# Patient Record
Sex: Female | Born: 1950
Health system: Southern US, Community
[De-identification: ages and names within clinical notes are randomized; demographics above are authoritative.]

## PROBLEM LIST (undated history)

## (undated) DIAGNOSIS — E854 Organ-limited amyloidosis: Secondary | ICD-10-CM

## (undated) DIAGNOSIS — I00 Rheumatic fever without heart involvement: Secondary | ICD-10-CM

## (undated) DIAGNOSIS — M1990 Unspecified osteoarthritis, unspecified site: Secondary | ICD-10-CM

## (undated) DIAGNOSIS — I10 Essential (primary) hypertension: Secondary | ICD-10-CM

## (undated) DIAGNOSIS — R0989 Other specified symptoms and signs involving the circulatory and respiratory systems: Secondary | ICD-10-CM

## (undated) DIAGNOSIS — J309 Allergic rhinitis, unspecified: Secondary | ICD-10-CM

## (undated) DIAGNOSIS — I499 Cardiac arrhythmia, unspecified: Secondary | ICD-10-CM

## (undated) DIAGNOSIS — E785 Hyperlipidemia, unspecified: Secondary | ICD-10-CM

## (undated) DIAGNOSIS — I4892 Unspecified atrial flutter: Secondary | ICD-10-CM

## (undated) DIAGNOSIS — Z5189 Encounter for other specified aftercare: Secondary | ICD-10-CM

## (undated) HISTORY — DX: Allergic rhinitis, unspecified: J30.9

## (undated) HISTORY — DX: Hyperlipidemia, unspecified: E78.5

## (undated) HISTORY — DX: Other specified symptoms and signs involving the circulatory and respiratory systems: R09.89

## (undated) HISTORY — DX: Essential (primary) hypertension: I10

## (undated) HISTORY — PX: EYE SURGERY: SHX253

## (undated) HISTORY — DX: Rheumatic fever without heart involvement: I00

## (undated) HISTORY — DX: Encounter for other specified aftercare: Z51.89

---

## 1979-03-09 DIAGNOSIS — Z5189 Encounter for other specified aftercare: Secondary | ICD-10-CM

## 1979-03-09 HISTORY — PX: SALPINGECTOMY: SHX328

## 1979-03-09 HISTORY — DX: Encounter for other specified aftercare: Z51.89

## 1998-06-26 ENCOUNTER — Other Ambulatory Visit: Admission: RE | Admit: 1998-06-26 | Discharge: 1998-06-26 | Payer: Self-pay | Admitting: Obstetrics and Gynecology

## 1999-07-31 ENCOUNTER — Other Ambulatory Visit: Admission: RE | Admit: 1999-07-31 | Discharge: 1999-07-31 | Payer: Self-pay | Admitting: Obstetrics and Gynecology

## 2000-09-20 ENCOUNTER — Other Ambulatory Visit: Admission: RE | Admit: 2000-09-20 | Discharge: 2000-09-20 | Payer: Self-pay | Admitting: Obstetrics and Gynecology

## 2001-01-02 ENCOUNTER — Inpatient Hospital Stay (HOSPITAL_COMMUNITY): Admission: EM | Admit: 2001-01-02 | Discharge: 2001-01-04 | Payer: Self-pay | Admitting: Emergency Medicine

## 2001-01-03 ENCOUNTER — Encounter: Payer: Self-pay | Admitting: Infectious Diseases

## 2001-11-30 ENCOUNTER — Other Ambulatory Visit: Admission: RE | Admit: 2001-11-30 | Discharge: 2001-11-30 | Payer: Self-pay | Admitting: Obstetrics and Gynecology

## 2003-02-21 ENCOUNTER — Other Ambulatory Visit: Admission: RE | Admit: 2003-02-21 | Discharge: 2003-02-21 | Payer: Self-pay | Admitting: Obstetrics and Gynecology

## 2004-01-22 ENCOUNTER — Ambulatory Visit: Payer: Self-pay | Admitting: Internal Medicine

## 2004-01-29 ENCOUNTER — Ambulatory Visit: Payer: Self-pay | Admitting: Internal Medicine

## 2004-04-03 ENCOUNTER — Ambulatory Visit: Payer: Self-pay | Admitting: Gastroenterology

## 2004-04-17 ENCOUNTER — Ambulatory Visit: Payer: Self-pay | Admitting: Gastroenterology

## 2004-08-28 ENCOUNTER — Other Ambulatory Visit: Admission: RE | Admit: 2004-08-28 | Discharge: 2004-08-28 | Payer: Self-pay | Admitting: Obstetrics and Gynecology

## 2005-03-08 HISTORY — PX: CARPAL TUNNEL RELEASE: SHX101

## 2005-10-12 ENCOUNTER — Ambulatory Visit: Payer: Self-pay | Admitting: Internal Medicine

## 2005-10-25 ENCOUNTER — Ambulatory Visit: Payer: Self-pay | Admitting: Internal Medicine

## 2007-01-02 ENCOUNTER — Ambulatory Visit: Payer: Self-pay | Admitting: Internal Medicine

## 2007-01-02 LAB — CONVERTED CEMR LAB
ALT: 23 units/L (ref 0–35)
AST: 20 units/L (ref 0–37)
Albumin: 3.9 g/dL (ref 3.5–5.2)
Alkaline Phosphatase: 78 units/L (ref 39–117)
BUN: 12 mg/dL (ref 6–23)
Basophils Absolute: 0 10*3/uL (ref 0.0–0.1)
Basophils Relative: 0.9 % (ref 0.0–1.0)
Bilirubin Urine: NEGATIVE
Bilirubin, Direct: 0.1 mg/dL (ref 0.0–0.3)
Blood in Urine, dipstick: NEGATIVE
CO2: 33 meq/L — ABNORMAL HIGH (ref 19–32)
Calcium: 9.7 mg/dL (ref 8.4–10.5)
Chloride: 106 meq/L (ref 96–112)
Cholesterol: 264 mg/dL (ref 0–200)
Creatinine, Ser: 0.7 mg/dL (ref 0.4–1.2)
Direct LDL: 186.9 mg/dL
Eosinophils Absolute: 0.2 10*3/uL (ref 0.0–0.6)
Eosinophils Relative: 4.6 % (ref 0.0–5.0)
GFR calc Af Amer: 111 mL/min
GFR calc non Af Amer: 91 mL/min
Glucose, Bld: 99 mg/dL (ref 70–99)
Glucose, Urine, Semiquant: NEGATIVE
HCT: 42.4 % (ref 36.0–46.0)
HDL: 48.9 mg/dL (ref >39.0)
Hemoglobin: 14.6 g/dL (ref 12.0–15.0)
Ketones, urine, test strip: NEGATIVE
Lymphocytes Relative: 33.6 % (ref 12.0–46.0)
MCHC: 34.3 g/dL (ref 30.0–36.0)
MCV: 90.5 fL (ref 78.0–100.0)
Monocytes Absolute: 0.3 10*3/uL (ref 0.2–0.7)
Monocytes Relative: 7.5 % (ref 3.0–11.0)
Neutro Abs: 1.9 10*3/uL (ref 1.4–7.7)
Neutrophils Relative %: 53.4 % (ref 43.0–77.0)
Nitrite: NEGATIVE
Platelets: 225 10*3/uL (ref 150–400)
Potassium: 5 meq/L (ref 3.5–5.1)
Protein, U semiquant: NEGATIVE
RBC: 4.69 M/uL (ref 3.87–5.11)
RDW: 12.4 % (ref 11.5–14.6)
Sodium: 143 meq/L (ref 135–145)
Specific Gravity, Urine: 1.01
TSH: 1.4 microintl units/mL (ref 0.35–5.50)
Total Bilirubin: 1.2 mg/dL (ref 0.3–1.2)
Total CHOL/HDL Ratio: 5.4
Total Protein: 6.9 g/dL (ref 6.0–8.3)
Triglycerides: 169 mg/dL — ABNORMAL HIGH (ref 0–149)
Urobilinogen, UA: 0.2
VLDL: 34 mg/dL (ref 0–40)
WBC: 3.6 10*3/uL — ABNORMAL LOW (ref 4.5–10.5)
pH: 7

## 2007-01-10 ENCOUNTER — Ambulatory Visit: Payer: Self-pay | Admitting: Internal Medicine

## 2007-05-02 ENCOUNTER — Ambulatory Visit: Payer: Self-pay | Admitting: Internal Medicine

## 2007-05-02 DIAGNOSIS — E785 Hyperlipidemia, unspecified: Secondary | ICD-10-CM

## 2007-05-02 HISTORY — DX: Hyperlipidemia, unspecified: E78.5

## 2007-09-04 ENCOUNTER — Telehealth (INDEPENDENT_AMBULATORY_CARE_PROVIDER_SITE_OTHER): Payer: Self-pay | Admitting: *Deleted

## 2007-10-23 ENCOUNTER — Telehealth: Payer: Self-pay | Admitting: Internal Medicine

## 2008-01-11 ENCOUNTER — Ambulatory Visit: Payer: Self-pay | Admitting: Internal Medicine

## 2008-01-11 LAB — CONVERTED CEMR LAB
ALT: 22 units/L (ref 0–35)
AST: 20 units/L (ref 0–37)
Albumin: 4.1 g/dL (ref 3.5–5.2)
Alkaline Phosphatase: 90 units/L (ref 39–117)
BUN: 19 mg/dL (ref 6–23)
Basophils Absolute: 0 10*3/uL (ref 0.0–0.1)
Basophils Relative: 1 % (ref 0.0–3.0)
Bilirubin Urine: NEGATIVE
Bilirubin, Direct: 0.2 mg/dL (ref 0.0–0.3)
Blood in Urine, dipstick: NEGATIVE
CO2: 31 meq/L (ref 19–32)
Calcium: 9.6 mg/dL (ref 8.4–10.5)
Chloride: 106 meq/L (ref 96–112)
Cholesterol: 240 mg/dL (ref 0–200)
Creatinine, Ser: 0.8 mg/dL (ref 0.4–1.2)
Direct LDL: 170.4 mg/dL
Eosinophils Absolute: 0.1 10*3/uL (ref 0.0–0.7)
Eosinophils Relative: 3.6 % (ref 0.0–5.0)
GFR calc Af Amer: 94 mL/min
GFR calc non Af Amer: 78 mL/min
Glucose, Bld: 85 mg/dL (ref 70–99)
Glucose, Urine, Semiquant: NEGATIVE
HCT: 42.2 % (ref 36.0–46.0)
HDL: 52 mg/dL (ref >39.0)
Hemoglobin: 14.8 g/dL (ref 12.0–15.0)
Ketones, urine, test strip: NEGATIVE
Lymphocytes Relative: 39.2 % (ref 12.0–46.0)
MCHC: 35 g/dL (ref 30.0–36.0)
MCV: 90.3 fL (ref 78.0–100.0)
Monocytes Absolute: 0.3 10*3/uL (ref 0.1–1.0)
Monocytes Relative: 7.1 % (ref 3.0–12.0)
Neutro Abs: 2.1 10*3/uL (ref 1.4–7.7)
Neutrophils Relative %: 49.1 % (ref 43.0–77.0)
Nitrite: NEGATIVE
Platelets: 236 10*3/uL (ref 150–400)
Potassium: 3.9 meq/L (ref 3.5–5.1)
Protein, U semiquant: NEGATIVE
RBC: 4.67 M/uL (ref 3.87–5.11)
RDW: 12.4 % (ref 11.5–14.6)
Sodium: 144 meq/L (ref 135–145)
Specific Gravity, Urine: 1.02
TSH: 1.59 microintl units/mL (ref 0.35–5.50)
Total Bilirubin: 1.6 mg/dL — ABNORMAL HIGH (ref 0.3–1.2)
Total CHOL/HDL Ratio: 4.6
Total Protein: 7.3 g/dL (ref 6.0–8.3)
Triglycerides: 80 mg/dL (ref 0–149)
Urobilinogen, UA: 0.2
VLDL: 16 mg/dL (ref 0–40)
Vit D, 1,25-Dihydroxy: 48 (ref 30–89)
WBC: 4.1 10*3/uL — ABNORMAL LOW (ref 4.5–10.5)
pH: 5.5

## 2008-01-12 ENCOUNTER — Ambulatory Visit: Payer: Self-pay | Admitting: Internal Medicine

## 2008-01-12 DIAGNOSIS — I10 Essential (primary) hypertension: Secondary | ICD-10-CM

## 2008-01-12 DIAGNOSIS — J309 Allergic rhinitis, unspecified: Secondary | ICD-10-CM

## 2008-01-12 HISTORY — DX: Allergic rhinitis, unspecified: J30.9

## 2008-01-12 HISTORY — DX: Essential (primary) hypertension: I10

## 2008-11-08 ENCOUNTER — Encounter (INDEPENDENT_AMBULATORY_CARE_PROVIDER_SITE_OTHER): Payer: Self-pay | Admitting: *Deleted

## 2008-11-08 ENCOUNTER — Encounter: Payer: Self-pay | Admitting: Internal Medicine

## 2009-03-25 ENCOUNTER — Ambulatory Visit: Payer: Self-pay | Admitting: Internal Medicine

## 2009-03-25 LAB — CONVERTED CEMR LAB
ALT: 19 units/L (ref 0–35)
AST: 21 units/L (ref 0–37)
Albumin: 4.1 g/dL (ref 3.5–5.2)
Alkaline Phosphatase: 58 units/L (ref 39–117)
BUN: 20 mg/dL (ref 6–23)
Basophils Absolute: 0 10*3/uL (ref 0.0–0.1)
Basophils Relative: 0.7 % (ref 0.0–3.0)
Bilirubin Urine: NEGATIVE
Bilirubin, Direct: 0.1 mg/dL (ref 0.0–0.3)
Blood in Urine, dipstick: NEGATIVE
CO2: 31 meq/L (ref 19–32)
Calcium: 9.9 mg/dL (ref 8.4–10.5)
Chloride: 109 meq/L (ref 96–112)
Cholesterol: 216 mg/dL — ABNORMAL HIGH (ref 0–200)
Creatinine, Ser: 0.7 mg/dL (ref 0.4–1.2)
Direct LDL: 150.5 mg/dL
Eosinophils Absolute: 0.1 10*3/uL (ref 0.0–0.7)
Eosinophils Relative: 2.5 % (ref 0.0–5.0)
GFR calc non Af Amer: 90.56 mL/min (ref >60)
Glucose, Bld: 88 mg/dL (ref 70–99)
Glucose, Urine, Semiquant: NEGATIVE
HCT: 41.3 % (ref 36.0–46.0)
HDL: 57.6 mg/dL (ref >39.00)
Hemoglobin: 13.7 g/dL (ref 12.0–15.0)
Lymphocytes Relative: 35.3 % (ref 12.0–46.0)
Lymphs Abs: 1.3 10*3/uL (ref 0.7–4.0)
MCHC: 33.1 g/dL (ref 30.0–36.0)
MCV: 92.8 fL (ref 78.0–100.0)
Monocytes Absolute: 0.2 10*3/uL (ref 0.1–1.0)
Monocytes Relative: 5.8 % (ref 3.0–12.0)
Neutro Abs: 2.1 10*3/uL (ref 1.4–7.7)
Neutrophils Relative %: 55.7 % (ref 43.0–77.0)
Nitrite: NEGATIVE
Platelets: 219 10*3/uL (ref 150.0–400.0)
Potassium: 5 meq/L (ref 3.5–5.1)
Protein, U semiquant: NEGATIVE
RBC: 4.44 M/uL (ref 3.87–5.11)
RDW: 12.2 % (ref 11.5–14.6)
Sodium: 144 meq/L (ref 135–145)
Specific Gravity, Urine: 1.015
TSH: 1.04 microintl units/mL (ref 0.35–5.50)
Total Bilirubin: 1.1 mg/dL (ref 0.3–1.2)
Total CHOL/HDL Ratio: 4
Total Protein: 7 g/dL (ref 6.0–8.3)
Triglycerides: 57 mg/dL (ref 0.0–149.0)
Urobilinogen, UA: 0.2
VLDL: 11.4 mg/dL (ref 0.0–40.0)
WBC Urine, dipstick: NEGATIVE
WBC: 3.7 10*3/uL — ABNORMAL LOW (ref 4.5–10.5)
pH: 6

## 2009-04-01 ENCOUNTER — Ambulatory Visit: Payer: Self-pay | Admitting: Internal Medicine

## 2009-04-01 ENCOUNTER — Telehealth: Payer: Self-pay | Admitting: Internal Medicine

## 2009-04-01 DIAGNOSIS — R0989 Other specified symptoms and signs involving the circulatory and respiratory systems: Secondary | ICD-10-CM

## 2009-04-01 HISTORY — DX: Other specified symptoms and signs involving the circulatory and respiratory systems: R09.89

## 2009-04-08 ENCOUNTER — Encounter: Payer: Self-pay | Admitting: Internal Medicine

## 2009-04-09 ENCOUNTER — Encounter: Payer: Self-pay | Admitting: Internal Medicine

## 2009-04-09 ENCOUNTER — Ambulatory Visit: Payer: Self-pay

## 2009-08-12 ENCOUNTER — Encounter: Payer: Self-pay | Admitting: Internal Medicine

## 2009-12-26 ENCOUNTER — Ambulatory Visit: Payer: Self-pay | Admitting: Cardiology

## 2009-12-26 ENCOUNTER — Encounter (INDEPENDENT_AMBULATORY_CARE_PROVIDER_SITE_OTHER): Payer: Self-pay | Admitting: Emergency Medicine

## 2010-01-09 ENCOUNTER — Ambulatory Visit: Payer: Self-pay | Admitting: Internal Medicine

## 2010-02-12 ENCOUNTER — Observation Stay (HOSPITAL_COMMUNITY): Admission: EM | Admit: 2010-02-12 | Discharge: 2009-12-26 | Payer: Self-pay | Admitting: Emergency Medicine

## 2010-04-09 NOTE — Assessment & Plan Note (Signed)
Summary: CPX/NJR   Vital Signs:  Patient profile:   60 year old female Weight:      127 pounds BMI:     23.69 BP sitting:   160 / 74  (left arm) Cuff size:   regular  Vitals Entered By: Raechel Ache, RN (April 01, 2009 1:21 PM) CC: CPX, labs done. Sees gyn.   CC:  CPX and labs done. Sees gyn.Marland Kitchen  History of Present Illness: 60 year old patient who is seen today for a health-maintenance examination .  She is followed annually by gynecology  Allergies: No Known Drug Allergies  Past History:  Past Medical History: history of rheumatic fever Hyperlipidemia hypertensive suspect Allergic rhinitis bilateral faint carotid bruits  Past Surgical History: ENT surgery,  gravida two, para one, abortus one  status post salpingectomy colonoscopy 2006  carotid doppler eval 1997 "plaque without blockage"  Family History: Reviewed history from 01/12/2008 and no changes required. father died 64, cerebrovascular disease mother died age 36, stomach cancer Aunt  history lung cancer paternal aunt with colon cancer  Three sisters, one died age 41  complications of congestive heart failure  Social History: Reviewed history from 01/12/2008 and no changes required. Married Never Smoke One  1 child  Review of Systems  The patient denies anorexia, fever, weight loss, weight gain, vision loss, decreased hearing, hoarseness, chest pain, syncope, dyspnea on exertion, peripheral edema, prolonged cough, headaches, hemoptysis, abdominal pain, melena, hematochezia, severe indigestion/heartburn, hematuria, incontinence, genital sores, muscle weakness, suspicious skin lesions, transient blindness, difficulty walking, depression, unusual weight change, abnormal bleeding, enlarged lymph nodes, angioedema, and breast masses.    Physical Exam  General:  Well-developed,well-nourished,in no acute distress; alert,appropriate and cooperative throughout examination; 160/80 Head:  Normocephalic and  atraumatic without obvious abnormalities. No apparent alopecia or balding. Eyes:  No corneal or conjunctival inflammation noted. EOMI. Perrla. Funduscopic exam benign, without hemorrhages, exudates or papilledema. Vision grossly normal. Ears:  External ear exam shows no significant lesions or deformities.  Otoscopic examination reveals clear canals, tympanic membranes are intact bilaterally without bulging, retraction, inflammation or discharge. Hearing is grossly normal bilaterally. Nose:  External nasal examination shows no deformity or inflammation. Nasal mucosa are pink and moist without lesions or exudates. Mouth:  Oral mucosa and oropharynx without lesions or exudates.  Teeth in good repair. Neck:  bilateral faint carotid bruit Chest Wall:  No deformities, masses, or tenderness noted. Breasts:  No mass, nodules, thickening, tenderness, bulging, retraction, inflamation, nipple discharge or skin changes noted.   Lungs:  Normal respiratory effort, chest expands symmetrically. Lungs are clear to auscultation, no crackles or wheezes. Heart:  Normal rate and regular rhythm. S1 and S2 normal without gallop, murmur, click, rub or other extra sounds. Abdomen:  Bowel sounds positive,abdomen soft and non-tender without masses, organomegaly or hernias noted. Msk:  No deformity or scoliosis noted of thoracic or lumbar spine.   Pulses:  R and L carotid,radial,femoral,dorsalis pedis and posterior tibial pulses are full and equal bilaterally Extremities:  No clubbing, cyanosis, edema, or deformity noted with normal full range of motion of all joints.   Neurologic:  No cranial nerve deficits noted. Station and gait are normal. Plantar reflexes are down-going bilaterally. DTRs are symmetrical throughout. Sensory, motor and coordinative functions appear intact. Skin:  Intact without suspicious lesions or rashes Cervical Nodes:  No lymphadenopathy noted Axillary Nodes:  No palpable lymphadenopathy Inguinal  Nodes:  No significant adenopathy Psych:  Cognition and judgment appear intact. Alert and cooperative with normal attention span and  concentration. No apparent delusions, illusions, hallucinations   Impression & Recommendations:  Problem # 1:  Preventive Health Care (ICD-V70.0)  Problem # 2:  ESSENTIAL HYPERTENSION, BENIGN (ICD-401.1)  Her updated medication list for this problem includes:    Hydrochlorothiazide 25 Mg Tabs (Hydrochlorothiazide) .Marland Kitchen... 1 once daily  Her updated medication list for this problem includes:    Hydrochlorothiazide 25 Mg Tabs (Hydrochlorothiazide) .Marland Kitchen... 1 once daily  Orders: Prescription Created Electronically [id number])  Problem # 3:  HYPERLIPIDEMIA (ICD-272.4) consider statin therapy if any significant findings on carotid artery.  Doppler study  Complete Medication List: 1)  Fluticasone Propionate 50 Mcg/act Susp (Fluticasone propionate) .... Use daily 2)  Fexofenadine Hcl 180 Mg Tabs (Fexofenadine hcl) .... One daily 3)  Finasteride 5 Mg Tabs (Finasteride) .... 1/2 once daily 4)  Epipen 0.3 Mg/0.46ml (1:1000) Devi (Epinephrine hcl (anaphylaxis)) .... As directed 5)  Hydrochlorothiazide 25 Mg Tabs (Hydrochlorothiazide) .Marland Kitchen.. 1 once daily  Other Orders: [facility] (Vascular)  Patient Instructions: 1)  Please schedule a follow-up appointment in 4 months. 2)  Check your Blood Pressure regularly. If it is above: you should make an appointment. 3)  Limit your Sodium (Salt). Prescriptions: HYDROCHLOROTHIAZIDE 25 MG TABS (HYDROCHLOROTHIAZIDE) 1 once daily  #90 x 4   Entered and Authorized by:   Gordy Savers  MD   Signed by:   Gordy Savers  MD on 04/01/2009   Method used:   Print then Give to Patient   RxID:   [id number] FINASTERIDE 5 MG  TABS (FINASTERIDE) 1/2 once daily  #90 x 6   Entered and Authorized by:   Gordy Savers  MD   Signed by:   Gordy Savers  MD on 04/01/2009   Method used:   Print then Give to Patient    RxID:   [id number] FEXOFENADINE HCL 180 MG  TABS (FEXOFENADINE HCL) one daily  #90 x 6   Entered and Authorized by:   Gordy Savers  MD   Signed by:   Gordy Savers  MD on 04/01/2009   Method used:   Print then Give to Patient   RxID:   [id number] FLUTICASONE PROPIONATE 50 MCG/ACT  SUSP (FLUTICASONE PROPIONATE) use daily  #3 x 6   Entered and Authorized by:   Gordy Savers  MD   Signed by:   Gordy Savers  MD on 04/01/2009   Method used:   Print then Give to Patient   RxID:   [id number] HYDROCHLOROTHIAZIDE 25 MG TABS (HYDROCHLOROTHIAZIDE) 1 once daily  #90 x 4   Entered and Authorized by:   Gordy Savers  MD   Signed by:   Gordy Savers  MD on 04/01/2009   Method used:   Electronically to        [facility] [phone]* (retail)       [address] [place], [place]  [address]       Ph: [phone]       Fax: [phone]   RxID:   [id number] FINASTERIDE 5 MG  TABS (FINASTERIDE) 1/2 once daily  #90 x 6   Entered and Authorized by:   Gordy Savers  MD   Signed by:   Gordy Savers  MD on 04/01/2009   Method used:   Electronically to        [facility] [phone]* (retail)       [address]  [place], [place]  [address]       Ph: [phone]       Fax: [phone]   RxID:   [id number] FEXOFENADINE HCL 180 MG  TABS (FEXOFENADINE HCL) one daily  #90 x 6   Entered and Authorized by:   Gordy Savers  MD   Signed by:   Gordy Savers  MD on 04/01/2009   Method used:   Electronically to        [facility] [phone]* (retail)       [address] [place], [place]  [address]       Ph: [phone]       Fax: [phone]   RxID:   [id number] FLUTICASONE PROPIONATE 50 MCG/ACT  SUSP (FLUTICASONE PROPIONATE) use daily  #3 x 6   Entered and Authorized by:   Gordy Savers  MD   Signed by:   Gordy Savers  MD on 04/01/2009   Method used:   Electronically to        [facility] [phone]* (retail)       [address] [place], [place]  [address]       Ph: [phone]       Fax: [phone]   RxID:   [id number]

## 2010-04-09 NOTE — Progress Notes (Signed)
Summary: EKG  Phone Note Call from Patient   Caller: Patient Call For: Gordy Savers  MD Summary of Call: Pt forgot to ask Dr. Kirtland Bouchard how her EKG looked today??? [phone] Initial call taken by: Lynann Beaver CMA,  April 01, 2009 3:50 PM  Follow-up for Phone Call        Pt notified. EKG normal. Follow-up by: Lynann Beaver CMA,  April 02, 2009 8:15 AM    EKG was normal

## 2010-04-09 NOTE — Assessment & Plan Note (Signed)
Summary: bp check//lch  I. a for a  Vital Signs:  Patient profile:   60 year old female Weight:      128 pounds Temp:     98.0 degrees F oral BP sitting:   140 / 80  (left arm) Cuff size:   regular  Vitals Entered By: Duard Brady LPN (January 09, 2010 8:32 AM) CC: bp check - also concerns about reflux Is Patient Diabetic? No   CC:  bp check - also concerns about reflux.  History of Present Illness: 60 year old patient who is seen today for follow-up of her hypertension.  Home blood pressure readings revealed consistently high systolic readings.  She also is complain of mild reflux symptoms.  Her mother died of esophageal cancer, and she is somewhat concerned.  She has been using OTC progress sac with great benefit.  She has a history of very mild right carotid stenosis and a bruit.  Statin therapy discussed  Preventive Screening-Counseling & Management  Alcohol-Tobacco     Smoking Status: never  Allergies (verified): No Known Drug Allergies  Past History:  Past Medical History: Reviewed history from 04/01/2009 and no changes required. history of rheumatic fever Hyperlipidemia hypertensive suspect Allergic rhinitis bilateral faint carotid bruits  Review of Systems  The patient denies anorexia, fever, weight loss, weight gain, vision loss, decreased hearing, hoarseness, chest pain, syncope, dyspnea on exertion, peripheral edema, prolonged cough, headaches, hemoptysis, abdominal pain, melena, hematochezia, severe indigestion/heartburn, hematuria, incontinence, genital sores, muscle weakness, suspicious skin lesions, transient blindness, difficulty walking, depression, unusual weight change, abnormal bleeding, enlarged lymph nodes, angioedema, and breast masses.    Physical Exam  General:  Well-developed,well-nourished,in no acute distress; alert,appropriate and cooperative throughout examination Head:  Normocephalic and atraumatic without obvious abnormalities. No  apparent alopecia or balding. Eyes:  No corneal or conjunctival inflammation noted. EOMI. Perrla. Funduscopic exam benign, without hemorrhages, exudates or papilledema. Vision grossly normal. Mouth:  Oral mucosa and oropharynx without lesions or exudates.  Teeth in good repair. Neck:  right carotid bruit Lungs:  Normal respiratory effort, chest expands symmetrically. Lungs are clear to auscultation, no crackles or wheezes. Heart:  Normal rate and regular rhythm. S1 and S2 normal without gallop, murmur, click, rub or other extra sounds. Abdomen:  Bowel sounds positive,abdomen soft and non-tender without masses, organomegaly or hernias noted. Msk:  No deformity or scoliosis noted of thoracic or lumbar spine.   Pulses:  R and L carotid,radial,femoral,dorsalis pedis and posterior tibial pulses are full and equal bilaterally Extremities:  No clubbing, cyanosis, edema, or deformity noted with normal full range of motion of all joints.     Impression & Recommendations:  Problem # 1:  CAROTID BRUITS, BILATERAL (ICD-785.9)  Problem # 2:  ESSENTIAL HYPERTENSION, BENIGN (ICD-401.1)  The following medications were removed from the medication list:    Hydrochlorothiazide 25 Mg Tabs (Hydrochlorothiazide) .Marland Kitchen... 1 once daily Her updated medication list for this problem includes:    Lisinopril-hydrochlorothiazide 20-25 Mg Tabs (Lisinopril-hydrochlorothiazide) ..... One daily  The following medications were removed from the medication list:    Hydrochlorothiazide 25 Mg Tabs (Hydrochlorothiazide) .Marland Kitchen... 1 once daily Her updated medication list for this problem includes:    Lisinopril-hydrochlorothiazide 20-25 Mg Tabs (Lisinopril-hydrochlorothiazide) ..... One daily  Problem # 3:  HYPERLIPIDEMIA (ICD-272.4)  Complete Medication List: 1)  Fluticasone Propionate 50 Mcg/act Susp (Fluticasone propionate) .... Use daily 2)  Fexofenadine Hcl 180 Mg Tabs (Fexofenadine hcl) .... One daily 3)  Finasteride 5 Mg  Tabs (Finasteride) .... 1/2 once  daily 4)  Epipen 0.3 Mg/0.10ml (1:1000) Devi (Epinephrine hcl (anaphylaxis)) .... As directed 5)  Lisinopril-hydrochlorothiazide 20-25 Mg Tabs (Lisinopril-hydrochlorothiazide) .... One daily  Patient Instructions: 1)  Please schedule a follow-up appointment in 3 months for annual exam Prescriptions: LISINOPRIL-HYDROCHLOROTHIAZIDE 20-25 MG TABS (LISINOPRIL-HYDROCHLOROTHIAZIDE) one daily  #90 x 6   Entered and Authorized by:   Gordy Savers  MD   Signed by:   Gordy Savers  MD on 01/09/2010   Method used:   Electronically to        [facility] [phone]* (retail)       [address] [place], [place]  [address]       Ph: [phone]       Fax: [phone]   RxID:   [id number]    Orders Added: 1)  Est. Patient Level III [[address]]

## 2010-04-09 NOTE — Miscellaneous (Signed)
Summary: Orders Update  Clinical Lists Changes  Orders: Added new Test order of Carotid Duplex (Carotid Duplex) - Signed 

## 2010-04-09 NOTE — Letter (Signed)
Summary: [facility]  [facility]   Imported By: Maryln Gottron 08/25/2009 10:45:35  _____________________________________________________________________  External Attachment:    Type:   Image     Comment:   External Document

## 2010-04-29 ENCOUNTER — Other Ambulatory Visit (INDEPENDENT_AMBULATORY_CARE_PROVIDER_SITE_OTHER): Payer: Managed Care, Other (non HMO) | Admitting: Internal Medicine

## 2010-04-29 DIAGNOSIS — E785 Hyperlipidemia, unspecified: Secondary | ICD-10-CM

## 2010-04-29 DIAGNOSIS — Z0000 Encounter for general adult medical examination without abnormal findings: Secondary | ICD-10-CM

## 2010-04-29 LAB — POCT URINALYSIS DIPSTICK
Bilirubin, UA: NEGATIVE
Ketones, UA: NEGATIVE
Nitrite, UA: NEGATIVE
pH, UA: 7

## 2010-04-29 LAB — BASIC METABOLIC PANEL
Chloride: 104 mEq/L (ref 96–112)
Potassium: 4.2 mEq/L (ref 3.5–5.1)

## 2010-04-29 LAB — HEPATIC FUNCTION PANEL
ALT: 22 U/L (ref 0–35)
Bilirubin, Direct: 0.2 mg/dL (ref 0.0–0.3)
Total Protein: 6.8 g/dL (ref 6.0–8.3)

## 2010-04-29 LAB — CBC WITH DIFFERENTIAL/PLATELET
Basophils Relative: 0.7 % (ref 0.0–3.0)
Eosinophils Relative: 4.3 % (ref 0.0–5.0)
HCT: 42.8 % (ref 36.0–46.0)
Hemoglobin: 14.5 g/dL (ref 12.0–15.0)
Lymphs Abs: 1.3 10*3/uL (ref 0.7–4.0)
MCV: 92.1 fl (ref 78.0–100.0)
Monocytes Absolute: 0.3 10*3/uL (ref 0.1–1.0)
Neutro Abs: 2.4 10*3/uL (ref 1.4–7.7)
RBC: 4.65 Mil/uL (ref 3.87–5.11)
WBC: 4.2 10*3/uL — ABNORMAL LOW (ref 4.5–10.5)

## 2010-04-29 LAB — LIPID PANEL
Cholesterol: 218 mg/dL — ABNORMAL HIGH (ref 0–200)
Triglycerides: 90 mg/dL (ref 0.0–149.0)

## 2010-04-29 LAB — TSH: TSH: 1.76 u[IU]/mL (ref 0.35–5.50)

## 2010-05-06 ENCOUNTER — Encounter: Payer: Self-pay | Admitting: Internal Medicine

## 2010-05-06 ENCOUNTER — Ambulatory Visit (INDEPENDENT_AMBULATORY_CARE_PROVIDER_SITE_OTHER): Payer: Managed Care, Other (non HMO) | Admitting: Internal Medicine

## 2010-05-06 VITALS — BP 110/70 | HR 74 | Temp 98.1°F | Resp 16 | Ht 61.5 in | Wt 126.0 lb

## 2010-05-06 DIAGNOSIS — Z0000 Encounter for general adult medical examination without abnormal findings: Secondary | ICD-10-CM

## 2010-05-06 DIAGNOSIS — K219 Gastro-esophageal reflux disease without esophagitis: Secondary | ICD-10-CM

## 2010-05-06 MED ORDER — HYDROCHLOROTHIAZIDE 12.5 MG PO TABS: 12.5000 mg | ORAL_TABLET | Freq: Every day | ORAL | Status: DC

## 2010-05-06 MED ORDER — FINASTERIDE 5 MG PO TABS: 5.0000 mg | ORAL_TABLET | Freq: Every day | ORAL | Status: DC

## 2010-05-06 MED ORDER — LANSOPRAZOLE 30 MG PO CPDR: 30.0000 mg | DELAYED_RELEASE_CAPSULE | Freq: Every day | ORAL | Status: AC

## 2010-05-06 MED ORDER — FLUTICASONE PROPIONATE 50 MCG/ACT NA SUSP: 2.0000 | Freq: Every day | NASAL | Status: DC

## 2010-05-06 NOTE — Progress Notes (Signed)
  Subjective:    Patient ID: Chelsea Morgan, female    DOB: 09-01-1950, 60 y.o.   MRN: [id number]  HPI   60 year old patient who is seen today for a preventive health examination. She has a history of hypertension which has been controlled on lisinopril hydrochlorothiazide. She states her blood pressure readings are often low and she has had a chronic cough on this blood pressure medication. Her chief complaint is hoarseness this has been evaluated a number of years ago by ENT. She has postnasal drip and some mild to moderate reflux symptoms. She feels the hoarseness was better on Prevacid. She takes her allergic rhinitis medications sporadically    Review of Systems  Constitutional: Negative for fever, appetite change, fatigue and unexpected weight change.  HENT: Positive for rhinorrhea and postnasal drip. Negative for hearing loss, ear pain, nosebleeds, congestion, sore throat, mouth sores, trouble swallowing, neck stiffness, dental problem, voice change, sinus pressure and tinnitus.   Eyes: Negative for photophobia, pain, redness and visual disturbance.  Respiratory: Negative for cough, chest tightness and shortness of breath.   Cardiovascular: Negative for chest pain, palpitations and leg swelling.  Gastrointestinal: Negative for nausea, vomiting, abdominal pain, diarrhea, constipation, blood in stool, abdominal distention and rectal pain.  Genitourinary: Negative for dysuria, urgency, frequency, hematuria, flank pain, vaginal bleeding, vaginal discharge, difficulty urinating, genital sores, vaginal pain, menstrual problem and pelvic pain.  Musculoskeletal: Negative for back pain and arthralgias.  Skin: Negative for rash.  Neurological: Negative for dizziness, syncope, speech difficulty, weakness, light-headedness, numbness and headaches.  Hematological: Negative for adenopathy. Does not bruise/bleed easily.  Psychiatric/Behavioral: Negative for suicidal ideas, behavioral problems, self-injury,  dysphoric mood and agitation. The patient is not nervous/anxious.        Objective:   Physical Exam  Constitutional: She is oriented to person, place, and time. She appears well-developed and well-nourished.  HENT:  Head: Normocephalic and atraumatic.  Right Ear: External ear normal.  Left Ear: External ear normal.  Mouth/Throat: Oropharynx is clear and moist.  Eyes: Conjunctivae and EOM are normal.  Neck: Normal range of motion. Neck supple. No JVD present. No thyromegaly present.        Left carotid bruit  Cardiovascular: Normal rate, regular rhythm and normal heart sounds.   No murmur heard.       The right dorsalis pedis pulse diminished  Pulmonary/Chest: Effort normal and breath sounds normal. She has no wheezes. She has no rales.  Abdominal: Soft. Bowel sounds are normal. She exhibits no distension and no mass. There is no tenderness. There is no rebound and no guarding.  Musculoskeletal: Normal range of motion. She exhibits no edema and no tenderness.  Neurological: She is alert and oriented to person, place, and time. She has normal reflexes. No cranial nerve deficit. She exhibits normal muscle tone. Coordination normal.  Skin: Skin is warm and dry. No rash noted.  Psychiatric: She has a normal mood and affect. Her behavior is normal.          Assessment & Plan:   preventive health examination  Hypertension. In view of her cough lisinopril will be discontinued and she will been maintained on hydrochlorothiazide only   Allergic rhinitis

## 2010-05-06 NOTE — Patient Instructions (Signed)
Please check your blood pressure on a regular basis.  If it is consistently greater than 150/90, please make an office appointment.    It is important that you exercise regularly, at least 20 minutes 3 to 4 times per week.  If you develop chest pain or shortness of breath seek  medical attention.  Limit your sodium (Salt) intake  Avoids foods high in acid such as tomatoes citrus juices, and spicy foods.  Avoid eating within two hours of lying down or before exercising.  Do not overheat.  Try smaller more frequent meals.  If symptoms persist, elevate the head of her bed 12 inches while sleeping.  Return in 6 months for follow-up

## 2010-05-20 LAB — BASIC METABOLIC PANEL
BUN: 15 mg/dL (ref 6–23)
CO2: 31 mEq/L (ref 19–32)
Calcium: 9.5 mg/dL (ref 8.4–10.5)
Chloride: 102 mEq/L (ref 96–112)
Creatinine, Ser: 0.94 mg/dL (ref 0.4–1.2)
GFR calc Af Amer: >60 mL/min (ref >60)

## 2010-05-20 LAB — CBC
HCT: 40.7 % (ref 36.0–46.0)
MCH: 29.6 pg (ref 26.0–34.0)
MCV: 89.3 fL (ref 78.0–100.0)
Platelets: 211 10*3/uL (ref 150–400)
RDW: 12.3 % (ref 11.5–15.5)
WBC: 6 10*3/uL (ref 4.0–10.5)

## 2010-05-20 LAB — DIFFERENTIAL
Basophils Absolute: 0.1 10*3/uL (ref 0.0–0.1)
Eosinophils Absolute: 0.2 10*3/uL (ref 0.0–0.7)
Eosinophils Relative: 4 % (ref 0–5)
Lymphocytes Relative: 40 % (ref 12–46)
Lymphs Abs: 2.4 10*3/uL (ref 0.7–4.0)
Monocytes Absolute: 0.6 10*3/uL (ref 0.1–1.0)

## 2010-05-20 LAB — POCT CARDIAC MARKERS
CKMB, poc: <1 ng/mL — ABNORMAL LOW (ref 1.0–8.0)
Troponin i, poc: <0.05 ng/mL (ref 0.00–0.09)
Troponin i, poc: <0.05 ng/mL (ref 0.00–0.09)

## 2010-07-24 NOTE — Assessment & Plan Note (Signed)
[facility] OFFICE NOTE   Chelsea Morgan, Chelsea Morgan                        MRN:          [id number]  DATE:10/25/2005                            DOB:          [birth year]    A 60 year old female seen today for a wellness exam.  She is seeing a  dermatologist for some hair loss.  She is also followed by Dr. Tenny Craw for her  gynecologic care.  She is doing reasonably well.  Her only concern is the  hair loss.  She is on Finasteride one-half tablet daily.  She takes a number  of vitamins, but no other prescription medications.   PAST MEDICAL HISTORY:  1. Bilateral carpal tunnel release.  She is still having some residual      numbness involving the second and third fingers.  2. Remote history of acute rheumatic fever.  3. In 1991 had ENT surgery.  4. Gravida 2, para 1, abortus 1, status post salpingectomy.  5. Hospitalized briefly in 2002 for cellulitis secondary to an insect      bite.   FAMILY HISTORY REVIEW:  Father died at 72 of cerebrovascular disease, mother  died at 34 of esophageal cancer, three sisters; one died at 27 of an MI.   PHYSICAL EXAMINATION:  GENERAL:  A healthy-appearing, fit lady in no acute  distress.  VITAL SIGNS:  Blood pressure is 120/80.  EYES, EARS, NOSE, AND THROAT:  Clear.  NECK:  No adenopathy or thyroid enlargement.  CHEST:  Clear.  BREASTS:  Negative.  CARDIOVASCULAR:  Normal heart sounds, no murmur.  ABDOMEN:  Benign, no organomegaly.  EXTREMITIES:  Negative with full peripheral pulses.   IMPRESSION:  1. Menopausal syndrome.  2. History of acute rheumatic fever.  3. Mild hypercholesterolemia.   DISPOSITION:  The patient is fairly sedentary, and exercise regimen  encouraged.  Will recheck in one year.  She will have a mammogram and  gynecologic exam in the near future.                                   Gordy Savers, MD   PFK/MedQ  DD:  10/25/2005  DT:  10/25/2005  Job #:  [id number]

## 2010-07-24 NOTE — Discharge Summary (Signed)
[facility]  Patient:    Chelsea Morgan, Chelsea Morgan Visit Number: [id number] MRN: [id number]          Service Type: MED Location: 3W [id number] [id number] Attending Physician:  Duke Salvia Dictated by:   Valetta Mole Swords, M.D. [facility] Admit Date:  01/02/2001 Discharge Date: 01/04/2001                             Discharge Summary  DISCHARGE DIAGNOSES: 1. Presumed cellulitis. 2. Insect bite.  OTHER MEDICAL HISTORY:  C-section, salpingectomy secondary to a tubal pregnancy.  She has had a history of rheumatic fever.  DISCHARGE MEDICATIONS:  Keflex 500 mg p.o. t.i.d. for four days.  She is to complete seven days of antibiotics.  HOSPITAL LABORATORIES:  Admission BMET normal except for a glucose of 145. Blood cultures are negative to date.  CBC on January 03, 2001, was normal with a white count of 4.5.  PROCEDURES:  CT scan of the upper extremity (right) was performed on January 03, 2001.  IMPRESSION:  Cellulitis with associated edema in the subcutaneous fat.  Also evidence of myositis involving the dorsal musculature of the forearm was seen.  HOSPITAL COURSE:  The patient was admitted to the hospital service on January 02, 2001, admitted with presumed cellulitis after an insect bite.  She was treated aggressively and started on Ancef.  She was seen in consultation by Dr. Maurice March who agreed with continued antibiotics and also recommended CT scan of the arm (results as above).  On January 04, 2001, she was much better.  There was essentially no erythema on the upper extremity except for the lateral area of the wrist which had a 3-cm area of erythema with fascicular area associated with the erythema.  There was no lymphadenopathy, no erythematous streaks of the upper extremity.  There was some soft tissue swelling of the hand only.  At the time of discharge, the patient was stable.  She will continue p.o. antibiotics.  She will follow up with Dr. Debby Bud.  FOLLOW-UP  PLANS:  Dr. Debby Bud in two to four weeks.  She is able to return to work next week.  CONDITION ON DISCHARGE:  Improved and stable. Dictated by:   Valetta Mole Swords, M.D. [facility] Attending Physician:  Duke Salvia DD:  01/04/01 TD:  01/04/01 Job: [id number]

## 2011-04-03 ENCOUNTER — Other Ambulatory Visit: Payer: Self-pay | Admitting: Internal Medicine

## 2011-05-28 ENCOUNTER — Encounter: Payer: Self-pay | Admitting: Gastroenterology

## 2011-07-14 ENCOUNTER — Other Ambulatory Visit (INDEPENDENT_AMBULATORY_CARE_PROVIDER_SITE_OTHER): Payer: Managed Care, Other (non HMO)

## 2011-07-14 DIAGNOSIS — Z0000 Encounter for general adult medical examination without abnormal findings: Secondary | ICD-10-CM

## 2011-07-14 LAB — HEPATIC FUNCTION PANEL
Bilirubin, Direct: 0.1 mg/dL (ref 0.0–0.3)
Total Bilirubin: 1.2 mg/dL (ref 0.3–1.2)
Total Protein: 7.4 g/dL (ref 6.0–8.3)

## 2011-07-14 LAB — BASIC METABOLIC PANEL
BUN: 16 mg/dL (ref 6–23)
CO2: 30 mEq/L (ref 19–32)
Calcium: 9.6 mg/dL (ref 8.4–10.5)
Chloride: 102 mEq/L (ref 96–112)
Creatinine, Ser: 0.7 mg/dL (ref 0.4–1.2)

## 2011-07-14 LAB — LIPID PANEL
Cholesterol: 217 mg/dL — ABNORMAL HIGH (ref 0–200)
HDL: 62.5 mg/dL (ref >39.00)
Total CHOL/HDL Ratio: 3
Triglycerides: 92 mg/dL (ref 0.0–149.0)
VLDL: 18.4 mg/dL (ref 0.0–40.0)

## 2011-07-14 LAB — POCT URINALYSIS DIPSTICK
Bilirubin, UA: NEGATIVE
Blood, UA: NEGATIVE
Nitrite, UA: NEGATIVE
pH, UA: 7

## 2011-07-14 LAB — CBC WITH DIFFERENTIAL/PLATELET
Basophils Absolute: 0 10*3/uL (ref 0.0–0.1)
Eosinophils Absolute: 0.1 10*3/uL (ref 0.0–0.7)
MCHC: 32.9 g/dL (ref 30.0–36.0)
MCV: 84.5 fl (ref 78.0–100.0)
Monocytes Absolute: 0.3 10*3/uL (ref 0.1–1.0)
Neutrophils Relative %: 56.3 % (ref 43.0–77.0)
Platelets: 245 10*3/uL (ref 150.0–400.0)
RDW: 17.2 % — ABNORMAL HIGH (ref 11.5–14.6)

## 2011-07-16 ENCOUNTER — Other Ambulatory Visit: Payer: Managed Care, Other (non HMO)

## 2011-07-23 ENCOUNTER — Encounter: Payer: Self-pay | Admitting: Internal Medicine

## 2011-07-30 ENCOUNTER — Ambulatory Visit (INDEPENDENT_AMBULATORY_CARE_PROVIDER_SITE_OTHER): Payer: Managed Care, Other (non HMO) | Admitting: Internal Medicine

## 2011-07-30 ENCOUNTER — Encounter: Payer: Self-pay | Admitting: Internal Medicine

## 2011-07-30 VITALS — BP 110/70 | HR 66 | Temp 98.0°F | Resp 16 | Ht 61.0 in | Wt 124.0 lb

## 2011-07-30 DIAGNOSIS — I10 Essential (primary) hypertension: Secondary | ICD-10-CM

## 2011-07-30 DIAGNOSIS — Z0000 Encounter for general adult medical examination without abnormal findings: Secondary | ICD-10-CM

## 2011-07-30 DIAGNOSIS — R0989 Other specified symptoms and signs involving the circulatory and respiratory systems: Secondary | ICD-10-CM

## 2011-07-30 DIAGNOSIS — J309 Allergic rhinitis, unspecified: Secondary | ICD-10-CM

## 2011-07-30 MED ORDER — HYDROCHLOROTHIAZIDE 12.5 MG PO CAPS: 12.5000 mg | ORAL_CAPSULE | Freq: Every day | ORAL | Status: DC

## 2011-07-30 MED ORDER — FINASTERIDE 5 MG PO TABS: 2.5000 mg | ORAL_TABLET | Freq: Every day | ORAL | Status: DC

## 2011-07-30 MED ORDER — FINASTERIDE 5 MG PO TABS: 5.0000 mg | ORAL_TABLET | Freq: Every day | ORAL | Status: DC

## 2011-07-30 MED ORDER — FLUTICASONE PROPIONATE 50 MCG/ACT NA SUSP: 2.0000 | Freq: Every day | NASAL | Status: DC

## 2011-07-30 NOTE — Progress Notes (Signed)
Subjective:    Patient ID: Chelsea Morgan, female    DOB: 1950-07-07, 61 y.o.   MRN: [id number]  HPI 61 year old patient who is seen today for a preventive health examination. Medical problems include hypertension treated with diuretic therapy as well as allergic rhinitis. She has had a recent gynecologic evaluation  .  She is doing quite well and does exercise regularly. No concerns or complaints   Past Medical History  Diagnosis Date  . ALLERGIC RHINITIS 01/12/2008  . CAROTID BRUITS, BILATERAL 04/01/2009  . Essential hypertension, benign 01/12/2008  . HYPERLIPIDEMIA 05/02/2007  . Rheumatic fever     History   Social History  . Marital Status: Married    Spouse Name: N/A    Number of Children: N/A  . Years of Education: N/A   Occupational History  . Not on file.   Social History Main Topics  . Smoking status: Never Smoker   . Smokeless tobacco: Never Used  . Alcohol Use: No  . Drug Use: No  . Sexually Active: Not on file   Other Topics Concern  . Not on file   Social History Narrative  . No narrative on file    Past Surgical History  Procedure Date  . Salpingectomy     Family History  Problem Relation Age of Onset  . Cancer Mother     stomach  . Stroke Father   . Cancer Paternal Aunt     colon    No Known Allergies  Current Outpatient Prescriptions on File Prior to Visit  Medication Sig Dispense Refill  . EPINEPHrine (EPIPEN) 0.3 mg/0.3 mL DEVI Inject 0.3 mg into the muscle once.        . fexofenadine (ALLEGRA) 180 MG tablet Take 180 mg by mouth daily.        . finasteride (PROSCAR) 5 MG tablet Take 1 tablet (5 mg total) by mouth daily. 1/2 once daily  90 tablet  6  . fluticasone (FLONASE) 50 MCG/ACT nasal spray 2 sprays by Nasal route daily.  16 g  6  . hydrochlorothiazide (HYDRODIURIL) 12.5 MG tablet Take 1 tablet (12.5 mg total) by mouth daily.  90 tablet  6    BP 110/72  Pulse 66  Temp(Src) 98 F (36.7 C) (Oral)  Resp 16  Ht 5\' 1"  (1.549 m)  Wt  124 lb (56.246 kg)  BMI 23.43 kg/m2  SpO2 99%   Wt Readings from Last 3 Encounters:  07/30/11 124 lb (56.246 kg)  05/06/10 126 lb (57.153 kg)  01/09/10 128 lb (58.06 kg)    Review of Systems  Constitutional: Negative for fever, appetite change, fatigue and unexpected weight change.  HENT: Positive for congestion and rhinorrhea. Negative for hearing loss, ear pain, nosebleeds, sore throat, mouth sores, trouble swallowing, neck stiffness, dental problem, voice change, sinus pressure and tinnitus.   Eyes: Negative for photophobia, pain, redness and visual disturbance.  Respiratory: Negative for cough, chest tightness and shortness of breath.   Cardiovascular: Negative for chest pain, palpitations and leg swelling.  Gastrointestinal: Negative for nausea, vomiting, abdominal pain, diarrhea, constipation, blood in stool, abdominal distention and rectal pain.  Genitourinary: Negative for dysuria, urgency, frequency, hematuria, flank pain, vaginal bleeding, vaginal discharge, difficulty urinating, genital sores, vaginal pain, menstrual problem and pelvic pain.  Musculoskeletal: Negative for back pain and arthralgias.  Skin: Negative for rash.  Neurological: Negative for dizziness, syncope, speech difficulty, weakness, light-headedness, numbness and headaches.  Hematological: Negative for adenopathy. Does not bruise/bleed easily.  Psychiatric/Behavioral: Negative  for suicidal ideas, behavioral problems, self-injury, dysphoric mood and agitation. The patient is not nervous/anxious.        Objective:   Physical Exam  Constitutional: She is oriented to person, place, and time. She appears well-developed and well-nourished.  HENT:  Head: Normocephalic and atraumatic.  Right Ear: External ear normal.  Left Ear: External ear normal.  Mouth/Throat: Oropharynx is clear and moist.  Eyes: Conjunctivae and EOM are normal.  Neck: Normal range of motion. Neck supple. No JVD present. No thyromegaly  present.       Bilateral carotid bruits  Cardiovascular: Normal rate, regular rhythm, normal heart sounds and intact distal pulses.   No murmur heard.      Diminished right dorsalis pedis pulse  Pulmonary/Chest: Effort normal and breath sounds normal. She has no wheezes. She has no rales.  Abdominal: Soft. Bowel sounds are normal. She exhibits no distension and no mass. There is no tenderness. There is no rebound and no guarding.  Musculoskeletal: Normal range of motion. She exhibits no edema and no tenderness.  Neurological: She is alert and oriented to person, place, and time. She has normal reflexes. No cranial nerve deficit. She exhibits normal muscle tone. Coordination normal.  Skin: Skin is warm and dry. No rash noted.  Psychiatric: She has a normal mood and affect. Her behavior is normal.          Assessment & Plan:  Preventive health examination Essential hypertension. Well controlled Allergic rhinitis stable Bilateral carotid bruits (carotid duplex February 2 011)

## 2011-07-30 NOTE — Patient Instructions (Signed)
Limit your sodium (Salt) intake    It is important that you exercise regularly, at least 20 minutes 3 to 4 times per week.  If you develop chest pain or shortness of breath seek  medical attention.  Please check your blood pressure on a regular basis.  If it is consistently greater than 150/90, please make an office appointment.  Return in one year for follow-up  Take a calcium supplement, plus 800-1200 units of vitamin D 

## 2012-03-15 ENCOUNTER — Encounter: Payer: Self-pay | Admitting: Internal Medicine

## 2012-04-19 ENCOUNTER — Encounter: Payer: Self-pay | Admitting: Family Medicine

## 2012-04-19 ENCOUNTER — Ambulatory Visit (INDEPENDENT_AMBULATORY_CARE_PROVIDER_SITE_OTHER): Payer: Managed Care, Other (non HMO) | Admitting: Family Medicine

## 2012-04-19 VITALS — BP 130/80 | HR 67 | Temp 98.1°F | Wt 126.0 lb

## 2012-04-19 DIAGNOSIS — R1013 Epigastric pain: Secondary | ICD-10-CM

## 2012-04-19 DIAGNOSIS — K219 Gastro-esophageal reflux disease without esophagitis: Secondary | ICD-10-CM

## 2012-04-19 LAB — COMPREHENSIVE METABOLIC PANEL
Albumin: 3.5 g/dL (ref 3.5–5.2)
Alkaline Phosphatase: 65 U/L (ref 39–117)
BUN: 14 mg/dL (ref 6–23)
Calcium: 9.1 mg/dL (ref 8.4–10.5)
Chloride: 102 mEq/L (ref 96–112)
Glucose, Bld: 83 mg/dL (ref 70–99)
Potassium: 3.4 mEq/L — ABNORMAL LOW (ref 3.5–5.1)

## 2012-04-19 LAB — CBC WITH DIFFERENTIAL/PLATELET
Eosinophils Relative: 2.7 % (ref 0.0–5.0)
Monocytes Relative: 12.9 % — ABNORMAL HIGH (ref 3.0–12.0)
Neutrophils Relative %: 61.5 % (ref 43.0–77.0)
Platelets: 237 10*3/uL (ref 150.0–400.0)
WBC: 4.4 10*3/uL — ABNORMAL LOW (ref 4.5–10.5)

## 2012-04-19 LAB — H. PYLORI ANTIBODY, IGG: H Pylori IgG: NEGATIVE

## 2012-04-19 LAB — LIPASE: Lipase: 31 U/L (ref 11.0–59.0)

## 2012-04-19 MED ORDER — OMEPRAZOLE 20 MG PO CPDR: 20.0000 mg | DELAYED_RELEASE_CAPSULE | Freq: Every day | ORAL | Status: DC

## 2012-04-19 NOTE — Patient Instructions (Addendum)
-  Start the prilosec daily  -drink plenty of water to stay hydrated  -follow up with your doctor in 1-2 weeks or see a doctor immediately if symptoms worsening, severe pain or cannot tolerate fluids        Diet for Gastroesophageal Reflux Disease, Adult Reflux (acid reflux) is when acid from your stomach flows up into the esophagus. When acid comes in contact with the esophagus, the acid causes irritation and soreness (inflammation) in the esophagus. When reflux happens often or so severely that it causes damage to the esophagus, it is called gastroesophageal reflux disease (GERD). Nutrition therapy can help ease the discomfort of GERD. FOODS OR DRINKS TO AVOID OR LIMIT  Smoking or chewing tobacco. Nicotine is one of the most potent stimulants to acid production in the gastrointestinal tract.  Caffeinated and decaffeinated coffee and black tea.  Regular or low-calorie carbonated beverages or energy drinks (caffeine-free carbonated beverages are allowed).   Strong spices, such as black pepper, white pepper, red pepper, cayenne, curry powder, and chili powder.  Peppermint or spearmint.  Chocolate.  High-fat foods, including meats and fried foods. Extra added fats including oils, butter, salad dressings, and nuts. Limit these to less than 8 tsp per day.  Fruits and vegetables if they are not tolerated, such as citrus fruits or tomatoes.  Alcohol.  Any food that seems to aggravate your condition. If you have questions regarding your diet, call your caregiver or a registered dietitian. OTHER THINGS THAT MAY HELP GERD INCLUDE:   Eating your meals slowly, in a relaxed setting.  Eating 5 to 6 small meals per day instead of 3 large meals.  Eliminating food for a period of time if it causes distress.  Not lying down until 3 hours after eating a meal.  Keeping the head of your bed raised 6 to 9 inches (15 to 23 cm) by using a foam wedge or blocks under the legs of the bed. Lying  flat may make symptoms worse.  Being physically active. Weight loss may be helpful in reducing reflux in overweight or obese adults.  Wear loose fitting clothing EXAMPLE MEAL PLAN This meal plan is approximately 2,000 calories based on https://www.[facility].org/ meal planning guidelines. Breakfast   cup cooked oatmeal.  1 cup strawberries.  1 cup low-fat milk.  1 oz almonds. Snack  1 cup cucumber slices.  6 oz yogurt (made from low-fat or fat-free milk). Lunch  2 slice whole-wheat bread.  2 oz sliced [place].  2 tsp mayonnaise.  1 cup blueberries.  1 cup snap peas. Snack  6 whole-wheat crackers.  1 oz string cheese. Dinner   cup brown rice.  1 cup mixed veggies.  1 tsp olive oil.  3 oz grilled fish. Document Released: 02/22/2005 Document Revised: 05/17/2011 Document Reviewed: 01/08/2011 [facility] Patient Information 2013 [place], [place].

## 2012-04-19 NOTE — Progress Notes (Signed)
Chief Complaint  Patient presents with  . Abdominal Pain    cramping sensation that went around to back; low grade fever    HPI:  Acute visit for Abdominal Pain: -started 2 days ago -burning pain is in epigastric region and radiates to the R side - pain is not severe, pain comes and goes, worse after eating, has lots of acid reflux, has had acid up in her throat too -has had acid reflux on and off chronically but never took medication for it though doctor did put her on omeprazole for this in the past -other symptoms: indigestion, nausea, normal BM 2 days ago and small BM this morning -denies: fevers, vomiting, diarrhea, flank pain -sick contacts: lots of coworkers with stomach bug -saw a nurse yesterday and told had a slight UTI and was given cipro yesterday -her family has been saying that maybe she has pancreatitis or gallbladder problems - she is worried about this  ROS: See pertinent positives and negatives per HPI.  Past Medical History  Diagnosis Date  . ALLERGIC RHINITIS 01/12/2008  . CAROTID BRUITS, BILATERAL 04/01/2009  . Essential hypertension, benign 01/12/2008  . HYPERLIPIDEMIA 05/02/2007  . Rheumatic fever     Family History  Problem Relation Age of Onset  . Cancer Mother     stomach  . Stroke Father   . Cancer Paternal Aunt     colon    History   Social History  . Marital Status: Married    Spouse Name: N/A    Number of Children: N/A  . Years of Education: N/A   Social History Main Topics  . Smoking status: Never Smoker   . Smokeless tobacco: Never Used  . Alcohol Use: No  . Drug Use: No  . Sexually Active: None   Other Topics Concern  . None   Social History Narrative  . None    Current outpatient prescriptions:EPINEPHrine (EPIPEN) 0.3 mg/0.3 mL DEVI, Inject 0.3 mg into the muscle once.  , Disp: , Rfl: ;  fexofenadine (ALLEGRA) 180 MG tablet, Take 180 mg by mouth daily.  , Disp: , Rfl: ;  finasteride (PROSCAR) 5 MG tablet, Take 0.5 tablets (2.5  mg total) by mouth daily., Disp: 90 tablet, Rfl: 6;  fluticasone (FLONASE) 50 MCG/ACT nasal spray, Place 2 sprays into the nose daily., Disp: 16 g, Rfl: 6 hydrochlorothiazide (MICROZIDE) 12.5 MG capsule, Take 1 capsule (12.5 mg total) by mouth daily., Disp: 90 capsule, Rfl: 6;  hydrochlorothiazide (HYDRODIURIL) 12.5 MG tablet, Take 1 tablet (12.5 mg total) by mouth daily., Disp: 90 tablet, Rfl: 6;  omeprazole (PRILOSEC) 20 MG capsule, Take 1 capsule (20 mg total) by mouth daily., Disp: 30 capsule, Rfl: 3  EXAM:  Filed Vitals:   04/19/12 0924  BP: 130/80  Pulse: 67  Temp: 98.1 F (36.7 C)    Body mass index is 23.82 kg/(m^2).  GENERAL: vitals reviewed and listed above, alert, oriented, appears well hydrated and in no acute distress  HEENT: atraumatic, conjunttiva clear, no obvious abnormalities on inspection of external nose and ears  NECK: no obvious masses on inspection  LUNGS: clear to auscultation bilaterally, no wheezes, rales or rhonchi, good air movement  CV: HRRR, no peripheral edema  ABD: soft, BS +, mild TTP LLQ, no rebound or guarding, no CVA TTP  MS: moves all extremities without noticeable abnormality  PSYCH: pleasant and cooperative, somewhat anxious  ASSESSMENT AND PLAN:  Discussed the following assessment and plan:  1. Abdominal pain, epigastric  CMP  CMP   H. pylori Antibody, IgG   CBC with Differential   Lipase     2. Acid reflux  omeprazole (PRILOSEC) 20 MG capsule   omeprazole (PRILOSEC) 20 MG capsule   -from hx and with benign exam suspect GERD vs mild gastroenteritis given sick contacts - restarted PPI -pt very worried about other potential etiologies  (discussed) so labs per above  -advised to continue cipro as do not know what her UA showed yesterday -ED precations in case symptoms worsen -Patient advised to return or notify a doctor immediately if symptoms worsen or persist or new concerns arise.  Patient Instructions   -Start the prilosec  daily  -drink plenty of water to stay hydrated  -follow up with your doctor in 1-2 weeks or see a doctor immediately if symptoms worsening, severe pain or cannot tolerate fluids        Diet for Gastroesophageal Reflux Disease, Adult Reflux (acid reflux) is when acid from your stomach flows up into the esophagus. When acid comes in contact with the esophagus, the acid causes irritation and soreness (inflammation) in the esophagus. When reflux happens often or so severely that it causes damage to the esophagus, it is called gastroesophageal reflux disease (GERD). Nutrition therapy can help ease the discomfort of GERD. FOODS OR DRINKS TO AVOID OR LIMIT  Smoking or chewing tobacco. Nicotine is one of the most potent stimulants to acid production in the gastrointestinal tract.  Caffeinated and decaffeinated coffee and black tea.  Regular or low-calorie carbonated beverages or energy drinks (caffeine-free carbonated beverages are allowed).   Strong spices, such as black pepper, white pepper, red pepper, cayenne, curry powder, and chili powder.  Peppermint or spearmint.  Chocolate.  High-fat foods, including meats and fried foods. Extra added fats including oils, butter, salad dressings, and nuts. Limit these to less than 8 tsp per day.  Fruits and vegetables if they are not tolerated, such as citrus fruits or tomatoes.  Alcohol.  Any food that seems to aggravate your condition. If you have questions regarding your diet, call your caregiver or a registered dietitian. OTHER THINGS THAT MAY HELP GERD INCLUDE:   Eating your meals slowly, in a relaxed setting.  Eating 5 to 6 small meals per day instead of 3 large meals.  Eliminating food for a period of time if it causes distress.  Not lying down until 3 hours after eating a meal.  Keeping the head of your bed raised 6 to 9 inches (15 to 23 cm) by using a foam wedge or blocks under the legs of the bed. Lying flat may make symptoms  worse.  Being physically active. Weight loss may be helpful in reducing reflux in overweight or obese adults.  Wear loose fitting clothing EXAMPLE MEAL PLAN This meal plan is approximately 2,000 calories based on https://www.[facility].org/ meal planning guidelines. Breakfast   cup cooked oatmeal.  1 cup strawberries.  1 cup low-fat milk.  1 oz almonds. Snack  1 cup cucumber slices.  6 oz yogurt (made from low-fat or fat-free milk). Lunch  2 slice whole-wheat bread.  2 oz sliced [place].  2 tsp mayonnaise.  1 cup blueberries.  1 cup snap peas. Snack  6 whole-wheat crackers.  1 oz string cheese. Dinner   cup brown rice.  1 cup mixed veggies.  1 tsp olive oil.  3 oz grilled fish. Document Released: 02/22/2005 Document Revised: 05/17/2011 Document Reviewed: 01/08/2011 [facility] Patient Information 2013 [place], Lona Kettle,  Deriyah Kunath R.

## 2012-04-22 ENCOUNTER — Other Ambulatory Visit: Payer: Self-pay

## 2012-07-25 ENCOUNTER — Other Ambulatory Visit (INDEPENDENT_AMBULATORY_CARE_PROVIDER_SITE_OTHER): Payer: Managed Care, Other (non HMO)

## 2012-07-25 DIAGNOSIS — Z0000 Encounter for general adult medical examination without abnormal findings: Secondary | ICD-10-CM

## 2012-07-25 LAB — HEPATIC FUNCTION PANEL
ALT: 21 U/L (ref 0–35)
Albumin: 4.1 g/dL (ref 3.5–5.2)
Alkaline Phosphatase: 67 U/L (ref 39–117)
Total Protein: 7.2 g/dL (ref 6.0–8.3)

## 2012-07-25 LAB — BASIC METABOLIC PANEL
CO2: 32 mEq/L (ref 19–32)
Chloride: 104 mEq/L (ref 96–112)
Sodium: 140 mEq/L (ref 135–145)

## 2012-07-25 LAB — LDL CHOLESTEROL, DIRECT: Direct LDL: 144.4 mg/dL

## 2012-07-25 LAB — LIPID PANEL
Cholesterol: 225 mg/dL — ABNORMAL HIGH (ref 0–200)
Total CHOL/HDL Ratio: 4

## 2012-07-25 LAB — CBC WITH DIFFERENTIAL/PLATELET
Basophils Relative: 1 % (ref 0.0–3.0)
Eosinophils Absolute: 0.1 10*3/uL (ref 0.0–0.7)
Hemoglobin: 15.6 g/dL — ABNORMAL HIGH (ref 12.0–15.0)
Lymphocytes Relative: 31.7 % (ref 12.0–46.0)
MCHC: 33.9 g/dL (ref 30.0–36.0)
MCV: 88.9 fl (ref 78.0–100.0)
Monocytes Absolute: 0.3 10*3/uL (ref 0.1–1.0)
Neutro Abs: 2.4 10*3/uL (ref 1.4–7.7)
RBC: 5.17 Mil/uL — ABNORMAL HIGH (ref 3.87–5.11)

## 2012-07-25 LAB — POCT URINALYSIS DIPSTICK
Protein, UA: NEGATIVE
Spec Grav, UA: 1.015
Urobilinogen, UA: 0.2
pH, UA: 7

## 2012-08-01 ENCOUNTER — Ambulatory Visit (INDEPENDENT_AMBULATORY_CARE_PROVIDER_SITE_OTHER): Payer: Managed Care, Other (non HMO) | Admitting: Internal Medicine

## 2012-08-01 ENCOUNTER — Encounter: Payer: Self-pay | Admitting: Internal Medicine

## 2012-08-01 VITALS — BP 150/80 | HR 66 | Temp 98.1°F | Resp 18 | Ht 60.75 in | Wt 126.0 lb

## 2012-08-01 DIAGNOSIS — J309 Allergic rhinitis, unspecified: Secondary | ICD-10-CM

## 2012-08-01 DIAGNOSIS — E785 Hyperlipidemia, unspecified: Secondary | ICD-10-CM

## 2012-08-01 DIAGNOSIS — Z0000 Encounter for general adult medical examination without abnormal findings: Secondary | ICD-10-CM

## 2012-08-01 DIAGNOSIS — I10 Essential (primary) hypertension: Secondary | ICD-10-CM

## 2012-08-01 DIAGNOSIS — K219 Gastro-esophageal reflux disease without esophagitis: Secondary | ICD-10-CM

## 2012-08-01 DIAGNOSIS — R0989 Other specified symptoms and signs involving the circulatory and respiratory systems: Secondary | ICD-10-CM | POA: Insufficient documentation

## 2012-08-01 MED ORDER — OMEPRAZOLE 20 MG PO CPDR: 20.0000 mg | DELAYED_RELEASE_CAPSULE | Freq: Every day | ORAL | Status: DC

## 2012-08-01 MED ORDER — HYDROCHLOROTHIAZIDE 12.5 MG PO CAPS: 12.5000 mg | ORAL_CAPSULE | Freq: Every day | ORAL | Status: DC

## 2012-08-01 MED ORDER — FINASTERIDE 5 MG PO TABS: 2.5000 mg | ORAL_TABLET | Freq: Every day | ORAL | Status: DC

## 2012-08-01 NOTE — Progress Notes (Signed)
Subjective:    Patient ID: Chelsea Morgan, female    DOB: [birth year], 62 y.o.   MRN: [id number]  HPI Subjective:    Patient ID: Chelsea Morgan, female    DOB: March 10, 1950, 62 y.o.   MRN: [id number]  HPI 62 year-old patient who is seen today for a preventive health examination. Medical problems include hypertension treated with diuretic therapy as well as allergic rhinitis. She has had a recent gynecologic evaluation  .  She is doing quite well and does exercise regularly. No concerns or complaints  She has a history chronic bruits but none were noted today. She's had 2 carotid artery ultrasounds that have been normal  Past Medical History  Diagnosis Date  . ALLERGIC RHINITIS 01/12/2008  . CAROTID BRUITS, BILATERAL 04/01/2009  . Essential hypertension, benign 01/12/2008  . HYPERLIPIDEMIA 05/02/2007  . Rheumatic fever     History   Social History  . Marital Status: Married    Spouse Name: N/A    Number of Children: N/A  . Years of Education: N/A   Occupational History  . Not on file.   Social History Main Topics  . Smoking status: Never Smoker   . Smokeless tobacco: Never Used  . Alcohol Use: No  . Drug Use: No  . Sexually Active: Not on file   Other Topics Concern  . Not on file   Social History Narrative  . No narrative on file    Past Surgical History  Procedure Date  . Salpingectomy     Family History  Problem Relation Age of Onset  . Cancer Mother     stomach  . Stroke Father   . Cancer Paternal Aunt     colon    No Known Allergies  Current Outpatient Prescriptions on File Prior to Visit  Medication Sig Dispense Refill  . EPINEPHrine (EPIPEN) 0.3 mg/0.3 mL DEVI Inject 0.3 mg into the muscle once.        . fexofenadine (ALLEGRA) 180 MG tablet Take 180 mg by mouth daily.        . finasteride (PROSCAR) 5 MG tablet Take 1 tablet (5 mg total) by mouth daily. 1/2 once daily  90 tablet  6  . fluticasone (FLONASE) 50 MCG/ACT nasal spray 2 sprays by Nasal route  daily.  16 g  6  . hydrochlorothiazide (HYDRODIURIL) 12.5 MG tablet Take 1 tablet (12.5 mg total) by mouth daily.  90 tablet  6    BP 110/72  Pulse 66  Temp(Src) 98 F (36.7 C) (Oral)  Resp 16  Ht 5\' 1"  (1.549 m)  Wt 124 lb (56.246 kg)  BMI 23.43 kg/m2  SpO2 99%   Wt Readings from Last 3 Encounters:  07/30/11 124 lb (56.246 kg)  05/06/10 126 lb (57.153 kg)  01/09/10 128 lb (58.06 kg)    Review of Systems  Constitutional: Negative for fever, appetite change, fatigue and unexpected weight change.  HENT: Positive for congestion and rhinorrhea. Negative for hearing loss, ear pain, nosebleeds, sore throat, mouth sores, trouble swallowing, neck stiffness, dental problem, voice change, sinus pressure and tinnitus.   Eyes: Negative for photophobia, pain, redness and visual disturbance.  Respiratory: Negative for cough, chest tightness and shortness of breath.   Cardiovascular: Negative for chest pain, palpitations and leg swelling.  Gastrointestinal: Negative for nausea, vomiting, abdominal pain, diarrhea, constipation, blood in stool, abdominal distention and rectal pain.  Genitourinary: Negative for dysuria, urgency, frequency, hematuria, flank pain, vaginal bleeding, vaginal discharge, difficulty urinating, genital sores,  vaginal pain, menstrual problem and pelvic pain.  Musculoskeletal: Negative for back pain and arthralgias.  Skin: Negative for rash.  Neurological: Negative for dizziness, syncope, speech difficulty, weakness, light-headedness, numbness and headaches.  Hematological: Negative for adenopathy. Does not bruise/bleed easily.  Psychiatric/Behavioral: Negative for suicidal ideas, behavioral problems, self-injury, dysphoric mood and agitation. The patient is not nervous/anxious.        Objective:   Physical Exam  Constitutional: She is oriented to person, place, and time. She appears well-developed and well-nourished.  HENT:  Head: Normocephalic and atraumatic.  Right  Ear: External ear normal.  Left Ear: External ear normal.  Mouth/Throat: Oropharynx is clear and moist.  Eyes: Conjunctivae and EOM are normal.  Neck: Normal range of motion. Neck supple. No JVD present. No thyromegaly present.   Cardiovascular: Normal rate, regular rhythm, normal heart sounds and intact distal pulses.   No murmur heard.      Diminished right dorsalis pedis pulse  Pulmonary/Chest: Effort normal and breath sounds normal. She has no wheezes. She has no rales.  Abdominal: Soft. Bowel sounds are normal. She exhibits no distension and no mass. There is no tenderness. There is no rebound and no guarding.  Musculoskeletal: Normal range of motion. She exhibits no edema and no tenderness.  Neurological: She is alert and oriented to person, place, and time. She has normal reflexes. No cranial nerve deficit. She exhibits normal muscle tone. Coordination normal.  Skin: Skin is warm and dry. No rash noted.  Psychiatric: She has a normal mood and affect. Her behavior is normal.          Assessment & Plan:  Preventive health examination Essential hypertension. Well controlled Allergic rhinitis stable H/o Bilateral carotid bruits (carotid duplex February 2 011)- no audible bruits today. Will remove from problem list     Review of Systems Asym; gyn exam last week    Objective:   Physical Exam  See above      Assessment & Plan:    [facility]

## 2012-08-01 NOTE — Patient Instructions (Signed)
Limit your sodium (Salt) intake    It is important that you exercise regularly, at least 20 minutes 3 to 4 times per week.  If you develop chest pain or shortness of breath seek  medical attention.  Please check your blood pressure on a regular basis.  If it is consistently greater than 150/90, please make an office appointment.  Return in one year for follow-up  

## 2013-01-11 ENCOUNTER — Other Ambulatory Visit: Payer: Self-pay

## 2013-05-24 ENCOUNTER — Telehealth: Payer: Self-pay | Admitting: Internal Medicine

## 2013-05-24 NOTE — Telephone Encounter (Signed)
Noted  

## 2013-05-24 NOTE — Telephone Encounter (Signed)
Patient Information:  Caller Name: Larene BeachVickie  Phone: [phone]  Patient: Chelsea Morgan, Chelsea Morgan  Gender: Female  DOB: 12/19/1950  Age: 63 Years  PCP: Eleonore ChiquitoKwiatkowski, Peter (Family Practice > 65yrs old)  Office Follow Up:  Does the office need to follow up with this patient?: No  Instructions For The Office: N/A  RN Note:  Found to have tachycardia and hypertension during visit with employer health nurse.  Requests appointment for 05/29/13 after 1500. Transferred to Norma/office scheduler for future appointment per protocol.    Symptoms  Reason For Call & Symptoms: Asking if should have BP medication increased. BP 150/96, respirations 24 per employment nurse.  Antibiotic and Coricedin  ordered for sinus infection but not not started .  NP instructed to follow up with PCP regarding hypertension and tachycardia. Told pt MD might need to > HCTZ dose.   Mouth breathing due to nasal congestion.  Reviewed Health History In EMR: Yes  Reviewed Medications In EMR: Yes  Reviewed Allergies In EMR: Yes  Reviewed Surgeries / Procedures: Yes  Date of Onset of Symptoms: 05/24/2013  Treatments Tried: saline solution, Triamcinol nasal spray  Treatments Tried Worked: No  Guideline(s) Used:  High Blood Pressure  Disposition Per Guideline:   See Within 2 Weeks in Office  Reason For Disposition Reached:   BP > 140/90 and is taking BP medications  Advice Given:  General:  Untreated high blood pressure may cause damage to the heart, brain, kidneys, and eyes.  Treatment of high blood pressure can reduce the risk of stroke, heart attack, and heart failure.  The goal of blood pressure treatment for most patients with hypertension is to keep the blood pressure under 140/90.  Lifestyle Changes  Maintain a healthy weight. Lose weight if you are overweight.  Do 30 minutes of aerobic physical activity (e.g., brisk walking) most days of the week.  Eat a diet high in fresh fruits and low-fat dairy products. Limit your  intake of saturated and total fat. Choose foods that are lower in salt.  If you drink alcohol, you should limit your daily alcohol drinking. Women should have no more than one drink per day. Men should have no more than 2 drinks per day. A drink is defined as 1.5 oz hard liquor (one shot or jigger; 45 ml), 5 oz wine (small glass; 150 ml), or 12 oz beer (one can; 360 ml).  Call Back If:  Headache, blurred vision, difficulty talking, or difficulty walking occurs  Chest pain or difficulty breathing occurs  You want to go in to the office for a blood pressure check  You become worse.  Patient Will Follow Care Advice:  YES

## 2013-06-01 ENCOUNTER — Encounter: Payer: Self-pay | Admitting: Internal Medicine

## 2013-06-01 ENCOUNTER — Ambulatory Visit (INDEPENDENT_AMBULATORY_CARE_PROVIDER_SITE_OTHER): Payer: Managed Care, Other (non HMO) | Admitting: Internal Medicine

## 2013-06-01 VITALS — BP 140/82 | HR 77 | Temp 98.0°F | Resp 18 | Ht 60.75 in | Wt 126.0 lb

## 2013-06-01 DIAGNOSIS — I10 Essential (primary) hypertension: Secondary | ICD-10-CM

## 2013-06-01 DIAGNOSIS — M722 Plantar fascial fibromatosis: Secondary | ICD-10-CM

## 2013-06-01 NOTE — Progress Notes (Signed)
Pre-visit discussion using our clinic review tool. No additional management support is needed unless otherwise documented below in the visit note.  

## 2013-06-01 NOTE — Patient Instructions (Addendum)
Plantar Fasciitis Plantar fasciitis is a common condition that causes foot pain. It is soreness (inflammation) of the band of tough fibrous tissue on the bottom of the foot that runs from the heel bone (calcaneus) to the ball of the foot. The cause of this soreness may be from excessive standing, poor fitting shoes, running on hard surfaces, being overweight, having an abnormal walk, or overuse (this is common in runners) of the painful foot or feet. It is also common in aerobic exercise dancers and ballet dancers. SYMPTOMS  Most people with plantar fasciitis complain of:  Severe pain in the morning on the bottom of their foot especially when taking the first steps out of bed. This pain recedes after a few minutes of walking.  Severe pain is experienced also during walking following a long period of inactivity.  Pain is worse when walking barefoot or up stairs DIAGNOSIS   Your caregiver will diagnose this condition by examining and feeling your foot.  Special tests such as X-rays of your foot, are usually not needed. PREVENTION   Consult a sports medicine professional before beginning a new exercise program.  Walking programs offer a good workout. With walking there is a lower chance of overuse injuries common to runners. There is less impact and less jarring of the joints.  Begin all new exercise programs slowly. If problems or pain develop, decrease the amount of time or distance until you are at a comfortable level.  Wear good shoes and replace them regularly.  Stretch your foot and the heel cords at the back of the ankle (Achilles tendon) both before and after exercise.  Run or exercise on even surfaces that are not hard. For example, asphalt is better than pavement.  Do not run barefoot on hard surfaces.  If using a treadmill, vary the incline.  Do not continue to workout if you have foot or joint problems. Seek professional help if they do not improve. HOME CARE INSTRUCTIONS     Avoid activities that cause you pain until you recover.  Use ice or cold packs on the problem or painful areas after working out.  Only take over-the-counter or prescription medicines for pain, discomfort, or fever as directed by your caregiver.  Soft shoe inserts or athletic shoes with air or gel sole cushions may be helpful.  If problems continue or become more severe, consult a sports medicine caregiver or your own health care provider. Cortisone is a potent anti-inflammatory medication that may be injected into the painful area. You can discuss this treatment with your caregiver. MAKE SURE YOU:   Understand these instructions.  Will watch your condition.  Will get help right away if you are not doing well or get worse. Document Released: 11/17/2000 Document Revised: 05/17/2011 Document Reviewed: 01/17/2008 [employer] Patient Information 2014 [employer]. Plantar Fasciitis (Heel Spur Syndrome) with Rehab The plantar fascia is a fibrous, ligament-like, soft-tissue structure that spans the bottom of the foot. Plantar fasciitis is a condition that causes pain in the foot due to inflammation of the tissue. SYMPTOMS  Pain and tenderness on the underneath side of the foot. Pain that worsens with standing or walking. CAUSES  Plantar fasciitis is caused by irritation and injury to the plantar fascia on the underneath side of the foot. Common mechanisms of injury include: Direct trauma to bottom of the foot. Damage to a small nerve that runs under the foot where the main fascia attaches to the heel bone. Stress placed on the plantar fascia due   to bone spurs. RISK INCREASES WITH:  Activities that place stress on the plantar fascia (running, jumping, pivoting, or cutting). Poor strength and flexibility. Improperly fitted shoes. Tight calf muscles. Flat feet. Failure to warm-up properly before activity. Obesity. PREVENTION Warm up and stretch properly before activity. Allow for  adequate recovery between workouts. Maintain physical fitness: Strength, flexibility, and endurance. Cardiovascular fitness. Maintain a health body weight. Avoid stress on the plantar fascia. Wear properly fitted shoes, including arch supports for individuals who have flat feet. PROGNOSIS  If treated properly, then the symptoms of plantar fasciitis usually resolve without surgery. However, occasionally surgery is necessary. RELATED COMPLICATIONS  Recurrent symptoms that may result in a chronic condition. Problems of the lower back that are caused by compensating for the injury, such as limping. Pain or weakness of the foot during push-off following surgery. Chronic inflammation, scarring, and partial or complete fascia tear, occurring more often from repeated injections. TREATMENT  Treatment initially involves the use of ice and medication to help reduce pain and inflammation. The use of strengthening and stretching exercises may help reduce pain with activity, especially stretches of the Achilles tendon. These exercises may be performed at home or with a therapist. Your caregiver may recommend that you use heel cups of arch supports to help reduce stress on the plantar fascia. Occasionally, corticosteroid injections are given to reduce inflammation. If symptoms persist for greater than 6 months despite non-surgical (conservative), then surgery may be recommended.  MEDICATION  If pain medication is necessary, then nonsteroidal anti-inflammatory medications, such as aspirin and ibuprofen, or other minor pain relievers, such as acetaminophen, are often recommended. Do not take pain medication within 7 days before surgery. Prescription pain relievers may be given if deemed necessary by your caregiver. Use only as directed and only as much as you need. Corticosteroid injections may be given by your caregiver. These injections should be reserved for the most serious cases, because they may only be  given a certain number of times. HEAT AND COLD Cold treatment (icing) relieves pain and reduces inflammation. Cold treatment should be applied for 10 to 15 minutes every 2 to 3 hours for inflammation and pain and immediately after any activity that aggravates your symptoms. Use ice packs or massage the area with a piece of ice (ice massage). Heat treatment may be used prior to performing the stretching and strengthening activities prescribed by your caregiver, physical therapist, or athletic trainer. Use a heat pack or soak the injury in warm water. SEEK IMMEDIATE MEDICAL CARE IF: Treatment seems to offer no benefit, or the condition worsens. Any medications produce adverse side effects. EXERCISES RANGE OF MOTION (ROM) AND STRETCHING EXERCISES - Plantar Fasciitis (Heel Spur Syndrome) These exercises may help you when beginning to rehabilitate your injury. Your symptoms may resolve with or without further involvement from your physician, physical therapist or athletic trainer. While completing these exercises, remember:  Restoring tissue flexibility helps normal motion to return to the joints. This allows healthier, less painful movement and activity. An effective stretch should be held for at least 30 seconds. A stretch should never be painful. You should only feel a gentle lengthening or release in the stretched tissue. RANGE OF MOTION - Toe Extension, Flexion Sit with your right / left leg crossed over your opposite knee. Grasp your toes and gently pull them back toward the top of your foot. You should feel a stretch on the bottom of your toes and/or foot. Hold this stretch for __________ seconds. Now, gently   pull your toes toward the bottom of your foot. You should feel a stretch on the top of your toes and or foot. Hold this stretch for __________ seconds. Repeat __________ times. Complete this stretch __________ times per day.  RANGE OF MOTION - Ankle Dorsiflexion, Active Assisted Remove  shoes and sit on a chair that is preferably not on a carpeted surface. Place right / left foot under knee. Extend your opposite leg for support. Keeping your heel down, slide your right / left foot back toward the chair until you feel a stretch at your ankle or calf. If you do not feel a stretch, slide your bottom forward to the edge of the chair, while still keeping your heel down. Hold this stretch for __________ seconds. Repeat __________ times. Complete this stretch __________ times per day.  STRETCH  Gastroc, Standing Place hands on wall. Extend right / left leg, keeping the front knee somewhat bent. Slightly point your toes inward on your back foot. Keeping your right / left heel on the floor and your knee straight, shift your weight toward the wall, not allowing your back to arch. You should feel a gentle stretch in the right / left calf. Hold this position for __________ seconds. Repeat __________ times. Complete this stretch __________ times per day. STRETCH  Soleus, Standing Place hands on wall. Extend right / left leg, keeping the other knee somewhat bent. Slightly point your toes inward on your back foot. Keep your right / left heel on the floor, bend your back knee, and slightly shift your weight over the back leg so that you feel a gentle stretch deep in your back calf. Hold this position for __________ seconds. Repeat __________ times. Complete this stretch __________ times per day. STRETCH  Gastrocsoleus, Standing  Note: This exercise can place a lot of stress on your foot and ankle. Please complete this exercise only if specifically instructed by your caregiver.  Place the ball of your right / left foot on a step, keeping your other foot firmly on the same step. Hold on to the wall or a rail for balance. Slowly lift your other foot, allowing your body weight to press your heel down over the edge of the step. You should feel a stretch in your right / left calf. Hold this  position for __________ seconds. Repeat this exercise with a slight bend in your right / left knee. Repeat __________ times. Complete this stretch __________ times per day.  STRENGTHENING EXERCISES - Plantar Fasciitis (Heel Spur Syndrome)  These exercises may help you when beginning to rehabilitate your injury. They may resolve your symptoms with or without further involvement from your physician, physical therapist or athletic trainer. While completing these exercises, remember:  Muscles can gain both the endurance and the strength needed for everyday activities through controlled exercises. Complete these exercises as instructed by your physician, physical therapist or athletic trainer. Progress the resistance and repetitions only as guided. STRENGTH - Towel Curls Sit in a chair positioned on a non-carpeted surface. Place your foot on a towel, keeping your heel on the floor. Pull the towel toward your heel by only curling your toes. Keep your heel on the floor. If instructed by your physician, physical therapist or athletic trainer, add ____________________ at the end of the towel. Repeat __________ times. Complete this exercise __________ times per day. STRENGTH - Ankle Inversion Secure one end of a rubber exercise band/tubing to a fixed object (table, pole). Loop the other end around your foot   heel by only curling your toes. Keep your heel on the floor.  If instructed by your physician, physical therapist or athletic trainer, add ____________________ at the end of the towel. Repeat __________ times. Complete this exercise __________ times per day. STRENGTH - Ankle Inversion  Secure one end of a rubber exercise band/tubing to a fixed object (table, pole). Loop the other end around your foot just before your toes.  Place your fists between your knees. This will focus your strengthening at your ankle.  Slowly, pull your big toe up and in, making sure the band/tubing is positioned to resist the entire motion.  Hold this position for __________ seconds.  Have your muscles resist the band/tubing as it slowly pulls your foot back to the starting position. Repeat __________ times. Complete this exercises __________ times per day.  Document Released:  02/22/2005 Document Revised: 05/17/2011 Document Reviewed: 06/06/2008 [facility] Patient Information 2014 [place], [place].   Please check your blood pressure on a regular basis.  If it is consistently greater than 150/90, please make an office appointment.

## 2013-06-01 NOTE — Progress Notes (Signed)
Subjective:    Patient ID: Chelsea KitchensVickie S Ariel, female    DOB: 08/04/1950, 63 y.o.   MRN: [id number]  HPI  63 year old patient who has treated hypertension.  She presents today with a several day history of right medial heel pain.  This is worse after prolonged inactivity.  There's been no trauma or unusual overuse type activities.  She has hypertension, which has been controlled on hydrochlorothiazide.  For the past several days.  She states that she has had some high readings as taking an old prescription for lisinopril when necessary.  Past Medical History  Diagnosis Date  . ALLERGIC RHINITIS 01/12/2008  . CAROTID BRUITS, BILATERAL 04/01/2009  . Essential hypertension, benign 01/12/2008  . HYPERLIPIDEMIA 05/02/2007  . Rheumatic fever     History   Social History  . Marital Status: Married    Spouse Name: N/A    Number of Children: N/A  . Years of Education: N/A   Occupational History  . Not on file.   Social History Main Topics  . Smoking status: Never Smoker   . Smokeless tobacco: Never Used  . Alcohol Use: No  . Drug Use: No  . Sexual Activity: Not on file   Other Topics Concern  . Not on file   Social History Narrative  . No narrative on file    Past Surgical History  Procedure Laterality Date  . Salpingectomy      Family History  Problem Relation Age of Onset  . Cancer Mother     stomach  . Stroke Father   . Cancer Paternal Aunt     colon    No Known Allergies  Current Outpatient Prescriptions on File Prior to Visit  Medication Sig Dispense Refill  . EPINEPHrine (EPIPEN) 0.3 mg/0.3 mL DEVI Inject 0.3 mg into the muscle once.        . finasteride (PROSCAR) 5 MG tablet Take 0.5 tablets (2.5 mg total) by mouth daily.  90 tablet  3  . hydrochlorothiazide (MICROZIDE) 12.5 MG capsule Take 1 capsule (12.5 mg total) by mouth daily.  90 capsule  3   No current facility-administered medications on file prior to visit.    BP 140/82  Pulse 77  Temp(Src) 98 F  (36.7 C) (Oral)  Resp 18  Ht 5' 0.75" (1.543 m)  Wt 126 lb (57.153 kg)  BMI 24.01 kg/m2  SpO2 98%       Review of Systems  Constitutional: Negative.   HENT: Negative for congestion, dental problem, hearing loss, rhinorrhea, sinus pressure, sore throat and tinnitus.   Eyes: Negative for pain, discharge and visual disturbance.  Respiratory: Negative for cough and shortness of breath.   Cardiovascular: Negative for chest pain, palpitations and leg swelling.  Gastrointestinal: Negative for nausea, vomiting, abdominal pain, diarrhea, constipation, blood in stool and abdominal distention.  Genitourinary: Negative for dysuria, urgency, frequency, hematuria, flank pain, vaginal bleeding, vaginal discharge, difficulty urinating, vaginal pain and pelvic pain.  Musculoskeletal: Positive for arthralgias and gait problem. Negative for joint swelling.  Skin: Negative for rash.  Neurological: Negative for dizziness, syncope, speech difficulty, weakness, numbness and headaches.  Hematological: Negative for adenopathy.  Psychiatric/Behavioral: Negative for behavioral problems, dysphoric mood and agitation. The patient is not nervous/anxious.        Objective:   Physical Exam  Constitutional: She appears well-developed and well-nourished. No distress.  Blood pressure 140/80  Musculoskeletal:  Tenderness over the right medial heel region.  No signs otherwise of active inflammation  Assessment & Plan:   Hypertension, unclear control.  The patient will monitor her blood pressure readings at home more closely.  Recheck in 6 weeks will call the office if blood pressure is consistently greater than 150 over 90 Right heel pain.  Probable plantar fasciitis.

## 2013-06-02 ENCOUNTER — Telehealth: Payer: Self-pay | Admitting: Internal Medicine

## 2013-06-02 NOTE — Telephone Encounter (Signed)
Relevant patient education assigned to patient using Emmi. ° °

## 2013-07-06 ENCOUNTER — Telehealth: Payer: Self-pay | Admitting: Internal Medicine

## 2013-07-06 NOTE — Telephone Encounter (Addendum)
Pt would like to know if she can come for cpe around 6/1 . Pt was supposed to return for fu on bp around that time, but its also time for her cpe.  Pt states she thinks dr Kirtland Bouchardk needs to see her before the first available cpe 6/30. Pt would like to combine her cpe and fup for early june  pls advise if ok to sch.

## 2013-07-06 NOTE — Telephone Encounter (Signed)
Done

## 2013-07-06 NOTE — Telephone Encounter (Signed)
Can schedule physical as long as it has been a year.

## 2013-07-31 ENCOUNTER — Other Ambulatory Visit (INDEPENDENT_AMBULATORY_CARE_PROVIDER_SITE_OTHER): Payer: Managed Care, Other (non HMO)

## 2013-07-31 DIAGNOSIS — Z0000 Encounter for general adult medical examination without abnormal findings: Secondary | ICD-10-CM

## 2013-07-31 LAB — BASIC METABOLIC PANEL
BUN: 14 mg/dL (ref 6–23)
CALCIUM: 9.6 mg/dL (ref 8.4–10.5)
CO2: 31 meq/L (ref 19–32)
CREATININE: 0.7 mg/dL (ref 0.4–1.2)
Chloride: 103 mEq/L (ref 96–112)
GFR: 90.79 mL/min (ref >60.00)
GLUCOSE: 85 mg/dL (ref 70–99)
Potassium: 4.2 mEq/L (ref 3.5–5.1)
Sodium: 142 mEq/L (ref 135–145)

## 2013-07-31 LAB — CBC WITH DIFFERENTIAL/PLATELET
BASOS ABS: 0 10*3/uL (ref 0.0–0.1)
Basophils Relative: 0.7 % (ref 0.0–3.0)
EOS ABS: 0.3 10*3/uL (ref 0.0–0.7)
Eosinophils Relative: 5 % (ref 0.0–5.0)
HCT: 42.3 % (ref 36.0–46.0)
Hemoglobin: 14.2 g/dL (ref 12.0–15.0)
LYMPHS PCT: 23.5 % (ref 12.0–46.0)
Lymphs Abs: 1.3 10*3/uL (ref 0.7–4.0)
MCHC: 33.5 g/dL (ref 30.0–36.0)
MCV: 89.2 fl (ref 78.0–100.0)
Monocytes Absolute: 0.4 10*3/uL (ref 0.1–1.0)
Monocytes Relative: 7 % (ref 3.0–12.0)
NEUTROS ABS: 3.6 10*3/uL (ref 1.4–7.7)
NEUTROS PCT: 63.8 % (ref 43.0–77.0)
PLATELETS: 278 10*3/uL (ref 150.0–400.0)
RBC: 4.74 Mil/uL (ref 3.87–5.11)
RDW: 13.4 % (ref 11.5–15.5)
WBC: 5.7 10*3/uL (ref 4.0–10.5)

## 2013-07-31 LAB — LIPID PANEL
CHOLESTEROL: 222 mg/dL — AB (ref 0–200)
HDL: 57.8 mg/dL (ref >39.00)
LDL CALC: 148 mg/dL — AB (ref 0–99)
TRIGLYCERIDES: 81 mg/dL (ref 0.0–149.0)
Total CHOL/HDL Ratio: 4
VLDL: 16.2 mg/dL (ref 0.0–40.0)

## 2013-07-31 LAB — POCT URINALYSIS DIPSTICK
Bilirubin, UA: NEGATIVE
Blood, UA: NEGATIVE
GLUCOSE UA: NEGATIVE
Ketones, UA: NEGATIVE
Leukocytes, UA: NEGATIVE
Nitrite, UA: NEGATIVE
Protein, UA: NEGATIVE
SPEC GRAV UA: 1.01
UROBILINOGEN UA: 0.2
pH, UA: 7

## 2013-07-31 LAB — HEPATIC FUNCTION PANEL
ALBUMIN: 3.7 g/dL (ref 3.5–5.2)
ALK PHOS: 66 U/L (ref 39–117)
ALT: 18 U/L (ref 0–35)
AST: 18 U/L (ref 0–37)
BILIRUBIN DIRECT: 0.1 mg/dL (ref 0.0–0.3)
BILIRUBIN TOTAL: 0.9 mg/dL (ref 0.2–1.2)
Total Protein: 6.9 g/dL (ref 6.0–8.3)

## 2013-07-31 LAB — TSH: TSH: 2.15 u[IU]/mL (ref 0.35–4.50)

## 2013-08-07 ENCOUNTER — Ambulatory Visit (INDEPENDENT_AMBULATORY_CARE_PROVIDER_SITE_OTHER): Payer: Managed Care, Other (non HMO) | Admitting: Internal Medicine

## 2013-08-07 ENCOUNTER — Encounter: Payer: Self-pay | Admitting: Internal Medicine

## 2013-08-07 VITALS — BP 130/80 | HR 77 | Temp 98.8°F | Resp 20 | Ht 61.0 in | Wt 126.0 lb

## 2013-08-07 DIAGNOSIS — Z23 Encounter for immunization: Secondary | ICD-10-CM

## 2013-08-07 DIAGNOSIS — R0989 Other specified symptoms and signs involving the circulatory and respiratory systems: Secondary | ICD-10-CM

## 2013-08-07 DIAGNOSIS — I10 Essential (primary) hypertension: Secondary | ICD-10-CM

## 2013-08-07 DIAGNOSIS — Z0000 Encounter for general adult medical examination without abnormal findings: Secondary | ICD-10-CM

## 2013-08-07 DIAGNOSIS — J309 Allergic rhinitis, unspecified: Secondary | ICD-10-CM

## 2013-08-07 DIAGNOSIS — E785 Hyperlipidemia, unspecified: Secondary | ICD-10-CM

## 2013-08-07 MED ORDER — HYDROCHLOROTHIAZIDE 25 MG PO TABS: 25.0000 mg | ORAL_TABLET | Freq: Every day | ORAL | Status: DC

## 2013-08-07 NOTE — Patient Instructions (Addendum)
Limit your sodium (Salt) intake  Please check your blood pressure on a regular basis.  If it is consistently greater than 150/90, please make an office appointment.  It  is important that you exercise regularly, at least 20 minutes 3 to 4 times per week.  If you develop chest pain or shortness of breath seek  medical attention.  Take a calcium supplement, plus [id number] units of vitamin D  Return in one year for follow-up  Moderate caffeine usePalpitations  A palpitation is the feeling that your heartbeat is irregular or is faster than normal. It may feel like your heart is fluttering or skipping a beat. Palpitations are usually not a serious problem. However, in some cases, you may need further medical evaluation. CAUSES  Palpitations can be caused by:  Smoking.  Caffeine or other stimulants, such as diet pills or energy drinks.  Alcohol.  Stress and anxiety.  Strenuous physical activity.  Fatigue.  Certain medicines.  Heart disease, especially if you have a history of arrhythmias. This includes atrial fibrillation, atrial flutter, or supraventricular tachycardia.  An improperly working pacemaker or defibrillator. DIAGNOSIS  To find the cause of your palpitations, your caregiver will take your history and perform a physical exam. Tests may also be done, including:  Electrocardiography (ECG). This test records the heart's electrical activity.  Cardiac monitoring. This allows your caregiver to monitor your heart rate and rhythm in real time.  Holter monitor. This is a portable device that records your heartbeat and can help diagnose heart arrhythmias. It allows your caregiver to track your heart activity for several days, if needed.  Stress tests by exercise or by giving medicine that makes the heart beat faster. TREATMENT  Treatment of palpitations depends on the cause of your symptoms and can vary greatly. Most cases of palpitations do not require any treatment other than  time, relaxation, and monitoring your symptoms. Other causes, such as atrial fibrillation, atrial flutter, or supraventricular tachycardia, usually require further treatment. HOME CARE INSTRUCTIONS   Avoid:  Caffeinated coffee, tea, soft drinks, diet pills, and energy drinks.  Chocolate.  Alcohol.  Stop smoking if you smoke.  Reduce your stress and anxiety. Things that can help you relax include:  A method that measures bodily functions so you can learn to control them (biofeedback).  Yoga.  Meditation.  Physical activity such as swimming, jogging, or walking.  Get plenty of rest and sleep. SEEK MEDICAL CARE IF:   You continue to have a fast or irregular heartbeat beyond 24 hours.  Your palpitations occur more often. SEEK IMMEDIATE MEDICAL CARE IF:  You develop chest pain or shortness of breath.  You have a severe headache.  You feel dizzy, or you faint. MAKE SURE YOU:  Understand these instructions.  Will watch your condition.  Will get help right away if you are not doing well or get worse. Document Released: 02/20/2000 Document Revised: 06/19/2012 Document Reviewed: 04/23/2011 [facility] Patient Information 2014 [place], [place].

## 2013-08-07 NOTE — Progress Notes (Signed)
Pre-visit discussion using our clinic review tool. No additional management support is needed unless otherwise documented below in the visit note.  

## 2013-08-07 NOTE — Progress Notes (Signed)
Subjective:    Patient ID: Chelsea Morgan, female    DOB: 01/20/1951, 63 y.o.   MRN: [id number]  HPI  Subjective:    Patient ID: Chelsea Morgan, female    DOB: 10/17/1950, 63 y.o.   MRN: [id number]  HPI   [age]year-old patient who is seen today for a preventive health examination.  Medical problems include hypertension treated with diuretic therapy as well as allergic rhinitis. She has had a recent gynecologic evaluation  .  She is doing quite well and does exercise regularly.  She has a history chronic bruits. She's had 2 carotid artery ultrasounds that have been  performed with estimated stenoses of 0-39% The past 2 or 3 months.  She has had episodes of palpitations.  These may occur several times daily, but usually occur every 2 or 3 days.  She describes the abrupt onset and abrupt termination of documented heart rates of 145.  When she checks her blood pressure on her home monitor blood pressure is usually slightly elevated during these episodes and confirm an elevated pulse rate.  At times.  These are described as regular and other times irregular.  Her caffeine consumption is moderate but unchanged.  She has allergic rhinitis, but no correlation with decongestant use.  EKG reviewed today and remains normal  Current Allergies:  No known allergies   Past Medical History:   history of rheumatic fever  Hyperlipidemia  hypertensive suspect  Allergic rhinitis   Family History:   Father died at age 65, cerebrovascular disease  mother died age 65, stomach cancer  Aunt history lung cancer  paternal aunt with colon cancer  Three sisters, one died age 65 complications of congestive heart failure   Social History:   Married  Never Smoked  Risk Factors:  Tobacco use: never     Past Medical History  Diagnosis Date  . ALLERGIC RHINITIS 01/12/2008  . CAROTID BRUITS, BILATERAL 04/01/2009  . Essential hypertension, benign 01/12/2008  . HYPERLIPIDEMIA 05/02/2007  . Rheumatic fever      History   Social History  . Marital Status: Married    Spouse Name: N/A    Number of Children: N/A  . Years of Education: N/A   Occupational History  . Not on file.   Social History Main Topics  . Smoking status: Never Smoker   . Smokeless tobacco: Never Used  . Alcohol Use: No  . Drug Use: No  . Sexually Active: Not on file   Other Topics Concern  . Not on file   Social History Narrative  . No narrative on file    Past Surgical History  Procedure Date  . Salpingectomy     Family History  Problem Relation Age of Onset  . Cancer Mother     stomach  . Stroke Father   . Cancer Paternal Aunt     colon    No Known Allergies  Current Outpatient Prescriptions on File Prior to Visit  Medication Sig Dispense Refill  . EPINEPHrine (EPIPEN) 0.3 mg/0.3 mL DEVI Inject 0.3 mg into the muscle once.        . fexofenadine (ALLEGRA) 180 MG tablet Take 180 mg by mouth daily.        . finasteride (PROSCAR) 5 MG tablet Take 1 tablet (5 mg total) by mouth daily. 1/2 once daily  90 tablet  6  . fluticasone (FLONASE) 50 MCG/ACT nasal spray 2 sprays by Nasal route daily.  16 g  6  . hydrochlorothiazide (HYDRODIURIL)  12.5 MG tablet Take 1 tablet (12.5 mg total) by mouth daily.  90 tablet  6    BP 110/72  Pulse 66  Temp(Src) 98 F (36.7 C) (Oral)  Resp 16  Ht 5\' 1"  (1.549 m)  Wt 124 lb (56.246 kg)  BMI 23.43 kg/m2  SpO2 99%   Wt Readings from Last 3 Encounters:  07/30/11 124 lb (56.246 kg)  05/06/10 126 lb (57.153 kg)  01/09/10 128 lb (58.06 kg)    Review of Systems  Constitutional: Negative for fever, appetite change, fatigue and unexpected weight change.  HENT: Positive for congestion and rhinorrhea. Negative for hearing loss, ear pain, nosebleeds, sore throat, mouth sores, trouble swallowing, neck stiffness, dental problem, voice change, sinus pressure and tinnitus.   Eyes: Negative for photophobia, pain, redness and visual disturbance.  Respiratory: Negative for  cough, chest tightness and shortness of breath.   Cardiovascular: Negative for chest pain, palpitations and leg swelling.  Gastrointestinal: Negative for nausea, vomiting, abdominal pain, diarrhea, constipation, blood in stool, abdominal distention and rectal pain.  Genitourinary: Negative for dysuria, urgency, frequency, hematuria, flank pain, vaginal bleeding, vaginal discharge, difficulty urinating, genital sores, vaginal pain, menstrual problem and pelvic pain.  Musculoskeletal: Negative for back pain and arthralgias.  Skin: Negative for rash.  Neurological: Negative for dizziness, syncope, speech difficulty, weakness, light-headedness, numbness and headaches.  Hematological: Negative for adenopathy. Does not bruise/bleed easily.  Psychiatric/Behavioral: Negative for suicidal ideas, behavioral problems, self-injury, dysphoric mood and agitation. The patient is not nervous/anxious.        Objective:   Physical Exam  Constitutional: She is oriented to person, place, and time. She appears well-developed and well-nourished.  HENT:  Head: Normocephalic and atraumatic.  Right Ear: External ear normal.  Left Ear: External ear normal.  Mouth/Throat: Oropharynx is clear and moist.  Eyes: Conjunctivae and EOM are normal.  Neck: Normal range of motion. Neck supple. No JVD present. No thyromegaly present.   Cardiovascular: Normal rate, regular rhythm, normal heart sounds and intact distal pulses.   No murmur heard.      Diminished right dorsalis pedis pulse  Pulmonary/Chest: Effort normal and breath sounds normal. She has no wheezes. She has no rales.  Abdominal: Soft. Bowel sounds are normal. She exhibits no distension and no mass. There is no tenderness. There is no rebound and no guarding.  Musculoskeletal: Normal range of motion. She exhibits no edema and no tenderness.  Neurological: She is alert and oriented to person, place, and time. She has normal reflexes. No cranial nerve deficit. She  exhibits normal muscle tone. Coordination normal.  Skin: Skin is warm and dry. No rash noted.  Psychiatric: She has a normal mood and affect. Her behavior is normal.          Assessment & Plan:  Preventive health examination Essential hypertension. Well controlled Allergic rhinitis stable H/o Bilateral carotid bruits (carotid duplex February 2 011)- no audible bruits today. Will remove from problem list     Review of Systems  Asym; recent gyn exam     Objective:   Physical Exam  Neck:  Faint bilateral carotid bruits    See above      Assessment & Plan:    Prev Health Palpitations.  The patient will moderate caffeine use.  Options were discussed, including an event monitor Hypertension well controlled Mild dyslipidemia

## 2013-08-28 ENCOUNTER — Other Ambulatory Visit: Payer: Managed Care, Other (non HMO)

## 2013-09-04 ENCOUNTER — Encounter: Payer: Managed Care, Other (non HMO) | Admitting: Internal Medicine

## 2013-10-31 ENCOUNTER — Ambulatory Visit (INDEPENDENT_AMBULATORY_CARE_PROVIDER_SITE_OTHER): Payer: Managed Care, Other (non HMO) | Admitting: Internal Medicine

## 2013-10-31 ENCOUNTER — Emergency Department (HOSPITAL_COMMUNITY): Payer: Managed Care, Other (non HMO)

## 2013-10-31 ENCOUNTER — Emergency Department (HOSPITAL_COMMUNITY)
Admission: EM | Admit: 2013-10-31 | Discharge: 2013-10-31 | Disposition: A | Discharge status: Home / Self Care | Payer: Managed Care, Other (non HMO) | Attending: Emergency Medicine | Admitting: Emergency Medicine

## 2013-10-31 ENCOUNTER — Other Ambulatory Visit: Payer: Self-pay | Admitting: Physician Assistant

## 2013-10-31 ENCOUNTER — Encounter (HOSPITAL_COMMUNITY): Payer: Self-pay | Admitting: Emergency Medicine

## 2013-10-31 ENCOUNTER — Encounter: Payer: Self-pay | Admitting: Internal Medicine

## 2013-10-31 VITALS — BP 181/119 | HR 156 | Temp 97.8°F | Resp 20 | Ht 61.0 in | Wt 129.0 lb

## 2013-10-31 DIAGNOSIS — R000 Tachycardia, unspecified: Secondary | ICD-10-CM | POA: Diagnosis present

## 2013-10-31 DIAGNOSIS — E785 Hyperlipidemia, unspecified: Secondary | ICD-10-CM

## 2013-10-31 DIAGNOSIS — Z79899 Other long term (current) drug therapy: Secondary | ICD-10-CM | POA: Diagnosis not present

## 2013-10-31 DIAGNOSIS — I4892 Unspecified atrial flutter: Secondary | ICD-10-CM

## 2013-10-31 DIAGNOSIS — I10 Essential (primary) hypertension: Secondary | ICD-10-CM | POA: Diagnosis not present

## 2013-10-31 DIAGNOSIS — I480 Paroxysmal atrial fibrillation: Secondary | ICD-10-CM | POA: Diagnosis present

## 2013-10-31 DIAGNOSIS — I483 Typical atrial flutter: Secondary | ICD-10-CM

## 2013-10-31 DIAGNOSIS — Z8709 Personal history of other diseases of the respiratory system: Secondary | ICD-10-CM | POA: Insufficient documentation

## 2013-10-31 DIAGNOSIS — R0989 Other specified symptoms and signs involving the circulatory and respiratory systems: Secondary | ICD-10-CM

## 2013-10-31 DIAGNOSIS — R51 Headache: Secondary | ICD-10-CM | POA: Diagnosis not present

## 2013-10-31 DIAGNOSIS — I739 Peripheral vascular disease, unspecified: Secondary | ICD-10-CM

## 2013-10-31 LAB — BASIC METABOLIC PANEL
Anion gap: 12 (ref 5–15)
BUN: 18 mg/dL (ref 6–23)
CO2: 29 mEq/L (ref 19–32)
CREATININE: 0.69 mg/dL (ref 0.50–1.10)
Calcium: 9.8 mg/dL (ref 8.4–10.5)
Chloride: 103 mEq/L (ref 96–112)
GFR, EST NON AFRICAN AMERICAN: 89 mL/min — AB (ref >90)
Glucose, Bld: 95 mg/dL (ref 70–99)
POTASSIUM: 4.1 meq/L (ref 3.7–5.3)
Sodium: 144 mEq/L (ref 137–147)

## 2013-10-31 LAB — CBC
HEMATOCRIT: 44 % (ref 36.0–46.0)
Hemoglobin: 14.5 g/dL (ref 12.0–15.0)
MCH: 29.8 pg (ref 26.0–34.0)
MCHC: 33 g/dL (ref 30.0–36.0)
MCV: 90.5 fL (ref 78.0–100.0)
PLATELETS: 270 10*3/uL (ref 150–400)
RBC: 4.86 MIL/uL (ref 3.87–5.11)
RDW: 13.1 % (ref 11.5–15.5)
WBC: 4.7 10*3/uL (ref 4.0–10.5)

## 2013-10-31 LAB — PRO B NATRIURETIC PEPTIDE: Pro B Natriuretic peptide (BNP): 183.4 pg/mL — ABNORMAL HIGH (ref 0–125)

## 2013-10-31 MED ORDER — POTASSIUM CHLORIDE CRYS ER 20 MEQ PO TBCR
20.0000 meq | EXTENDED_RELEASE_TABLET | Freq: Every day | ORAL | Status: DC
Start: 2013-10-31 — End: 2013-11-20

## 2013-10-31 MED ORDER — METOPROLOL TARTRATE 50 MG PO TABS: 50.0000 mg | ORAL_TABLET | Freq: Two times a day (BID) | ORAL | Status: DC

## 2013-10-31 MED ORDER — ATORVASTATIN CALCIUM 20 MG PO TABS: 20.0000 mg | ORAL_TABLET | Freq: Every day | ORAL | Status: DC

## 2013-10-31 NOTE — ED Notes (Signed)
Patient transported to X-ray 

## 2013-10-31 NOTE — Progress Notes (Signed)
Subjective:    Patient ID: Chelsea Morgan, female    DOB: [birth year], 63 y.o.   MRN: [id number]  HPI   63 year old patient who is seen for a preventive health examination on June 2.  At that time, she gave a 2 to three-month history of intermittent palpitations.  Options were discussed, including an event monitor.  She was seen in the ED in [place] on August 17 with recurrent palpitations and dizziness.  EKG revealed normal sinus rhythm with frequent ectopics.  Chest x-ray suggested some interstitial changes;  due to headaches and accelerated hypertension, head CT scan was performed.  It revealed some mild microvascular changes only. The patient seen today complaining of palpitations.  EKG obtained revealed atrial flutter at a rate of 150  [facility] from [facility] reviewed  Discussed with cardiology  Past Medical History  Diagnosis Date  . ALLERGIC RHINITIS 01/12/2008  . CAROTID BRUITS, BILATERAL 04/01/2009  . Essential hypertension, benign 01/12/2008  . HYPERLIPIDEMIA 05/02/2007  . Rheumatic fever     History   Social History  . Marital Status: Married    Spouse Name: N/A    Number of Children: N/A  . Years of Education: N/A   Occupational History  . Not on file.   Social History Main Topics  . Smoking status: Never Smoker   . Smokeless tobacco: Never Used  . Alcohol Use: No  . Drug Use: No  . Sexual Activity: Not on file   Other Topics Concern  . Not on file   Social History Narrative  . No narrative on file    Past Surgical History  Procedure Laterality Date  . Salpingectomy      Family History  Problem Relation Age of Onset  . Cancer Mother     stomach  . Stroke Father   . Cancer Paternal Aunt     colon    No Known Allergies  Current Outpatient Prescriptions on File Prior to Visit  Medication Sig Dispense Refill  . b complex vitamins tablet Take 1 tablet by mouth daily.      Marland Kitchen BIOTIN 5000 PO Take 2 tablets by mouth daily.      . Calcium  Carbonate (CALCIUM 600 PO) Take 1 tablet by mouth daily.      . Cholecalciferol (VITAMIN D) 2000 UNITS CAPS Take 1 capsule by mouth daily.      Marland Kitchen EPINEPHrine (EPIPEN) 0.3 mg/0.3 mL DEVI Inject 0.3 mg into the muscle once.        . finasteride (PROSCAR) 5 MG tablet Take 0.5 tablets (2.5 mg total) by mouth daily.  90 tablet  3  . hydrochlorothiazide (HYDRODIURIL) 25 MG tablet Take 1 tablet (25 mg total) by mouth daily.  90 tablet  3  . Magnesium 250 MG TABS Take 1 tablet by mouth 2 (two) times daily.      . Multiple Vitamin (MULTIVITAMIN) tablet Take 1 tablet by mouth daily.      Marland Kitchen NASONEX 50 MCG/ACT nasal spray Place 2 sprays into the nose daily.       . Zinc 50 MG TABS Take 1 tablet by mouth daily.       No current facility-administered medications on file prior to visit.    BP 181/119  Pulse 156  Temp(Src) 97.8 F (36.6 C) (Oral)  Resp 20  Ht  (1.549 m)  Wt 129 lb (58.514 kg)  BMI 24.39 kg/m2  SpO2 97%     Review of Systems  Constitutional: Negative.   HENT: Negative for congestion, dental problem, hearing loss, rhinorrhea, sinus pressure, sore throat and tinnitus.   Eyes: Negative for pain, discharge and visual disturbance.  Respiratory: Negative for cough and shortness of breath.   Cardiovascular: Positive for palpitations. Negative for chest pain and leg swelling.  Gastrointestinal: Negative for nausea, vomiting, abdominal pain, diarrhea, constipation, blood in stool and abdominal distention.  Genitourinary: Negative for dysuria, urgency, frequency, hematuria, flank pain, vaginal bleeding, vaginal discharge, difficulty urinating, vaginal pain and pelvic pain.  Musculoskeletal: Negative for arthralgias, gait problem and joint swelling.  Skin: Negative for rash.  Neurological: Negative for dizziness, syncope, speech difficulty, weakness, numbness and headaches.  Hematological: Negative for adenopathy.  Psychiatric/Behavioral: Negative for behavioral problems, dysphoric  mood and agitation. The patient is not nervous/anxious.        Objective:   Physical Exam  Constitutional: She is oriented to person, place, and time. She appears well-developed and well-nourished.  HENT:  Head: Normocephalic.  Right Ear: External ear normal.  Left Ear: External ear normal.  Mouth/Throat: Oropharynx is clear and moist.  Eyes: Conjunctivae and EOM are normal. Pupils are equal, round, and reactive to light.  Neck: Normal range of motion. Neck supple. No thyromegaly present.  CSM causes temporary slowing of tachyarrhythmia, but reoccurs  Cardiovascular: Normal rate, regular rhythm, normal heart sounds and intact distal pulses.   Absent right dorsalis pedis pulse  Pulmonary/Chest: Effort normal and breath sounds normal.  Abdominal: Soft. Bowel sounds are normal. She exhibits no mass. There is no tenderness.  Musculoskeletal: Normal range of motion.  Lymphadenopathy:    She has no cervical adenopathy.  Neurological: She is alert and oriented to person, place, and time.  Skin: Skin is warm and dry. No rash noted.  Psychiatric: She has a normal mood and affect. Her behavior is normal.          Assessment & Plan:   Atrial flutter.  We'll place on Xarelto 20 mg daily; schedule 2-D echo and cardiology followup.  We'll place also on metoprolol 50 mg twice a day  (Addendum- patient was observed in the office setting for approximately 30 minutes without resolution of the atrial flutter.  Discussed with cardiology, who will evaluated at [facility] along ED) Hypertension.  Blood pressure elevated on arrival, but settled down to 140 over 90 range prior to transfer to the ED.  We'll continue diuretic therapy; additional rate control medication will be added to her blood pressure regimen Will need  Anticoagulation. PAD.  Patient has mild non-occlusive carotid artery disease, and an absent right dorsalis pedis pulse.  Will start on statin therapy Hypertension.  Continue  hydrochlorothiazide 25 mg daily.  Add potassium supplementation PAD.  Placed on atorvastatin 20

## 2013-10-31 NOTE — Discharge Instructions (Signed)
Take  Metoprolol twice per day. Take Xarelto as prescribed. Refer to attache documents for more information.

## 2013-10-31 NOTE — ED Notes (Signed)
Cardiology in department to assess patient

## 2013-10-31 NOTE — ED Provider Notes (Signed)
CSN: [id number]     Arrival date & time 10/31/13  1037 History  This chart was scribed for non-physician practitioner, Chelsea Beck, PA-C working with Chelsea Camel, MD by Chelsea Morgan, ED scribe. This patient was seen in room E44C/E44C and the patient's care was started at 10:55 AM.   Chief Complaint  Patient presents with  . Tachycardia   The history is provided by the patient. No language interpreter was used.   HPI Comments: Chelsea Morgan is a 63 y.o. female who presents to the Emergency Department complaining of palpitations that started earlier today. Reports mild associated headache. Pt has had intermittent palpitations since June 2015. She was seen by her PCP this morning for the same. Pt was placed on 20 mg xarelto and scheduled for a 2D echo and given cardiology follow up per Dr. Vernon Morgan note. Pt states that she was told by her doctor to come into the ED. Denies chest pain, light headedness currently.   Past Medical History  Diagnosis Date  . ALLERGIC RHINITIS 01/12/2008  . CAROTID BRUITS, BILATERAL 04/01/2009  . Essential hypertension, benign 01/12/2008  . HYPERLIPIDEMIA 05/02/2007  . Rheumatic fever    Past Surgical History  Procedure Laterality Date  . Salpingectomy     Family History  Problem Relation Age of Onset  . Cancer Mother     stomach  . Stroke Father   . Cancer Paternal Aunt     colon   History  Substance Use Topics  . Smoking status: Never Smoker   . Smokeless tobacco: Never Used  . Alcohol Use: No   OB History   Grav Para Term Preterm Abortions TAB SAB Ect Mult Living                 Review of Systems  Cardiovascular: Positive for palpitations. Negative for chest pain.  Neurological: Positive for headaches. Negative for light-headedness.  All other systems reviewed and are negative.  Allergies  Review of patient's allergies indicates no known allergies.  Home Medications   Prior to Admission medications   Medication Sig Start  Date End Date Taking? Authorizing Provider  atorvastatin (LIPITOR) 20 MG tablet Take 1 tablet (20 mg total) by mouth daily. 10/31/13   Chelsea Savers, MD  b complex vitamins tablet Take 1 tablet by mouth daily.    Historical Provider, MD  BIOTIN 5000 PO Take 2 tablets by mouth daily.    Historical Provider, MD  Calcium Carbonate (CALCIUM 600 PO) Take 1 tablet by mouth daily.    Historical Provider, MD  Cholecalciferol (VITAMIN D) 2000 UNITS CAPS Take 1 capsule by mouth daily.    Historical Provider, MD  EPINEPHrine (EPIPEN) 0.3 mg/0.3 mL DEVI Inject 0.3 mg into the muscle once.      Historical Provider, MD  finasteride (PROSCAR) 5 MG tablet Take 0.5 tablets (2.5 mg total) by mouth daily. 08/01/12   Chelsea Savers, MD  hydrochlorothiazide (HYDRODIURIL) 25 MG tablet Take 1 tablet (25 mg total) by mouth daily. 08/07/13   Chelsea Savers, MD  Magnesium 250 MG TABS Take 1 tablet by mouth 2 (two) times daily.    Historical Provider, MD  metoprolol (LOPRESSOR) 50 MG tablet Take 1 tablet (50 mg total) by mouth 2 (two) times daily. 10/31/13   Chelsea Savers, MD  Multiple Vitamin (MULTIVITAMIN) tablet Take 1 tablet by mouth daily.    Historical Provider, MD  NASONEX 50 MCG/ACT nasal spray Place 2 sprays into the nose daily.  03/29/13   Historical Provider, MD  potassium chloride SA (K-DUR,KLOR-CON) 20 MEQ tablet Take 1 tablet (20 mEq total) by mouth daily. 10/31/13   Chelsea Savers, MD  Zinc 50 MG TABS Take 1 tablet by mouth daily.    Historical Provider, MD   BP 169/68  Pulse 66  Temp(Src) 98.6 F (37 C) (Oral)  Resp 16  SpO2 100%  Physical Exam  Nursing note and vitals reviewed. Constitutional: She is oriented to person, place, and time. She appears well-developed and well-nourished. No distress.  HENT:  Head: Normocephalic and atraumatic.  Eyes: Conjunctivae and EOM are normal.  Neck: Neck supple. No tracheal deviation present.  Cardiovascular: Normal rate, regular rhythm and  normal heart sounds.   Pulmonary/Chest: Effort normal and breath sounds normal. No respiratory distress. She has no wheezes. She has no rales.  Musculoskeletal: Normal range of motion.  Neurological: She is alert and oriented to person, place, and time.  Skin: Skin is warm and dry.  Psychiatric: She has a normal mood and affect. Her behavior is normal.    ED Course  Procedures (including critical care time)  DIAGNOSTIC STUDIES: Oxygen Saturation is 97% on RA, normal by my interpretation.    COORDINATION OF CARE: 10:58 AM-Discussed treatment plan which includes lab work and consulting cardiology with pt at bedside and pt agreed to plan.   Labs Review Labs Reviewed  BASIC METABOLIC PANEL - Abnormal; Notable for the following:    GFR calc non Af Amer 89 (*)    All other components within normal limits  PRO B NATRIURETIC PEPTIDE - Abnormal; Notable for the following:    Pro B Natriuretic peptide (BNP) 183.4 (*)    All other components within normal limits  CBC    Imaging Review Dg Chest 2 View  10/31/2013   CLINICAL DATA:  Tachycardia.  EXAM: CHEST  2 VIEW  COMPARISON:  12/25/2009  FINDINGS: The heart size and mediastinal contours are within normal limits. Both lungs are clear. There is a moderate thoracolumbar scoliosis.  IMPRESSION: No active cardiopulmonary disease.   Electronically Signed   By: Geanie Cooley M.D.   On: 10/31/2013 11:18     EKG Interpretation None      MDM   Final diagnoses:  Atrial flutter, paroxysmal   12:54 PM Patient's cardiac rhythm fluctuating in and out of atrial flutter. Labs and chest xray unremarkable for acute changes. On arrival, patient's heart rate 156 and then dropped to 66.    I personally performed the services described in this documentation, which was scribed in my presence. The recorded information has been reviewed and is accurate.  Chelsea Morgan, [place] 10/31/13 [phone]

## 2013-10-31 NOTE — Patient Instructions (Signed)
Cardiology followup as scheduled  No decongestants  Avoid caffeinated productsAtrial Fibrillation Atrial fibrillation is a type of irregular heart rhythm (arrhythmia). During atrial fibrillation, the upper chambers of the heart (atria) quiver continuously in a chaotic pattern. This causes an irregular and often rapid heart rate.  Atrial fibrillation is the result of the heart becoming overloaded with disorganized signals that tell it to beat. These signals are normally released one at a time by a part of the right atrium called the sinoatrial node. They then travel from the atria to the lower chambers of the heart (ventricles), causing the atria and ventricles to contract and pump blood as they pass. In atrial fibrillation, parts of the atria outside of the sinoatrial node also release these signals. This results in two problems. First, the atria receive so many signals that they do not have time to fully contract. Second, the ventricles, which can only receive one signal at a time, beat irregularly and out of rhythm with the atria.  There are three types of atrial fibrillation:   Paroxysmal. Paroxysmal atrial fibrillation starts suddenly and stops on its own within a week.  Persistent. Persistent atrial fibrillation lasts for more than a week. It may stop on its own or with treatment.  Permanent. Permanent atrial fibrillation does not go away. Episodes of atrial fibrillation may lead to permanent atrial fibrillation. Atrial fibrillation can prevent your heart from pumping blood normally. It increases your risk of stroke and can lead to heart failure.  CAUSES   Heart conditions, including a heart attack, heart failure, coronary artery disease, and heart valve conditions.   Inflammation of the sac that surrounds the heart (pericarditis).  Blockage of an artery in the lungs (pulmonary embolism).  Pneumonia or other infections.  Chronic lung disease.  Thyroid problems, especially if the  thyroid is overactive (hyperthyroidism).  Caffeine, excessive alcohol use, and use of some illegal drugs.   Use of some medicines, including certain decongestants and diet pills.  Heart surgery.   Birth defects.  Sometimes, no cause can be found. When this happens, the atrial fibrillation is called lone atrial fibrillation. The risk of complications from atrial fibrillation increases if you have lone atrial fibrillation and you are age 24 years or older. RISK FACTORS  Heart failure.  Coronary artery disease.  Diabetes mellitus.   High blood pressure (hypertension).   Obesity.   Other arrhythmias.   Increased age. SIGNS AND SYMPTOMS   A feeling that your heart is beating rapidly or irregularly.   A feeling of discomfort or pain in your chest.   Shortness of breath.   Sudden light-headedness or weakness.   Getting tired easily when exercising.   Urinating more often than normal (mainly when atrial fibrillation first begins).  In paroxysmal atrial fibrillation, symptoms may start and suddenly stop. DIAGNOSIS  Your health care provider may be able to detect atrial fibrillation when taking your pulse. Your health care provider may have you take a test called an ambulatory electrocardiogram (ECG). An ECG records your heartbeat patterns over a 24-hour period. You may also have other tests, such as:  Transthoracic echocardiogram (TTE). During echocardiography, sound waves are used to evaluate how blood flows through your heart.  Transesophageal echocardiogram (TEE).  Stress test. There is more than one type of stress test. If a stress test is needed, ask your health care provider about which type is best for you.  Chest X-ray exam.  Blood tests.  Computed tomography (CT). TREATMENT  Treatment may  include:  Treating any underlying conditions. For example, if you have an overactive thyroid, treating the condition may correct atrial fibrillation.  Taking  medicine. Medicines may be given to control a rapid heart rate or to prevent blood clots, heart failure, or a stroke.  Having a procedure to correct the rhythm of the heart:  Electrical cardioversion. During electrical cardioversion, a controlled, low-energy shock is delivered to the heart through your skin. If you have chest pain, very low blood pressure, or sudden heart failure, this procedure may need to be done as an emergency.  Catheter ablation. During this procedure, heart tissues that send the signals that cause atrial fibrillation are destroyed.  Surgical ablation. During this surgery, thin lines of heart tissue that carry the abnormal signals are destroyed. This procedure can either be an open-heart surgery or a minimally invasive surgery. With the minimally invasive surgery, small cuts are made to access the heart instead of a large opening.  Pulmonary venous isolation. During this surgery, tissue around the veins that carry blood from the lungs (pulmonary veins) is destroyed. This tissue is thought to carry the abnormal signals. HOME CARE INSTRUCTIONS   Take medicines only as directed by your health care provider. Some medicines can make atrial fibrillation worse or recur.  If blood thinners were prescribed by your health care provider, take them exactly as directed. Too much blood-thinning medicine can cause bleeding. If you take too little, you will not have the needed protection against stroke and other problems.  Perform blood tests at home if directed by your health care provider. Perform blood tests exactly as directed.  Quit smoking if you smoke.  Do not drink alcohol.  Do not drink caffeinated beverages such as coffee, soda, and some teas. You may drink decaffeinated coffee, soda, or tea.   Maintain a healthy weight.Do not use diet pills unless your health care provider approves. They may make heart problems worse.   Follow diet instructions as directed by your  health care provider.  Exercise regularly as directed by your health care provider.  Keep all follow-up visits as directed by your health care provider. This is important. PREVENTION  The following substances can cause atrial fibrillation to recur:   Caffeinated beverages.  Alcohol.  Certain medicines, especially those used for breathing problems.  Certain herbs and herbal medicines, such as those containing ephedra or ginseng.  Illegal drugs, such as cocaine and amphetamines. Sometimes medicines are given to prevent atrial fibrillation from recurring. Proper treatment of any underlying condition is also important in helping prevent recurrence.  SEEK MEDICAL CARE IF:  You notice a change in the rate, rhythm, or strength of your heartbeat.  You suddenly begin urinating more frequently.  You tire more easily when exerting yourself or exercising. SEEK IMMEDIATE MEDICAL CARE IF:   You have chest pain, abdominal pain, sweating, or weakness.  You feel nauseous.  You have shortness of breath.  You suddenly have swollen feet and ankles.  You feel dizzy.  Your face or limbs feel numb or weak.  You have a change in your vision or speech. MAKE SURE YOU:   Understand these instructions.  Will watch your condition.  Will get help right away if you are not doing well or get worse. Document Released: 02/22/2005 Document Revised: 07/09/2013 Document Reviewed: 04/04/2012 [facility] Patient Information 2015 [place], [place]. This information is not intended to replace advice given to you by your health care provider. Make sure you discuss any questions you have with  your health care provider.

## 2013-10-31 NOTE — ED Notes (Signed)
Per pt sts she has been having episodes of heart palpitations and tachycardia x a few months. Pt went to her doctor this am and heart rate was in the 150s. Denies chest pain. Pt currently in NSR with rate in the 60.

## 2013-10-31 NOTE — Consult Note (Signed)
CONSULTATION NOTE  Reason for Consult: Atrial Flutter with RVR  Requesting Provider: Alvina Chou, PA-C (EDP)  PCP: Bluford Kaufmann, MD  Cardiologist: None  HPI: This is a 63 y.o. female with a past medical history significant for non-occlusive carotid disease, HTN& HLD (with h/o Rheumatic Fever) who was sent to ER this PM by PCP after presenting with intermittent palipations x ~2-3 months.  After initially in NSR, went into what appears to be Atrial Flutter (EKG not yet scanned) with RVR ~151 bpm (vs. SVT).  Has recently gone to ER in [place] - for palpitations & dizziness -- only SR with Ectopy (PACs), mild interstitial changes on CXR.  Also noted hypertension.   Today --> went to PCP for f/u & was in rapid AFlutter.  Sent to ER after unable to convert.  Upon arrival here,   No chest pain or shortness of breath with rest or exertion.  No PND, orthopnea or edema.  + palpitations, lightheadedness, dizziness,  No weakness or syncope/near syncope; TIA/amaurosis fugax symptoms. No melena, hematochezia, hematuria, or epstaxis. No claudication.  PMHx:  Past Medical History  Diagnosis Date  . ALLERGIC RHINITIS 01/12/2008  . CAROTID BRUITS, BILATERAL 04/01/2009  . Essential hypertension, benign 01/12/2008  . HYPERLIPIDEMIA 05/02/2007  . Rheumatic fever    Past Surgical History  Procedure Laterality Date  . Salpingectomy      FAMHx: Family History  Problem Relation Age of Onset  . Cancer Mother     stomach  . Stroke Father   . Cancer Paternal Aunt     colon    SOCHx:  reports that she has never smoked. She has never used smokeless tobacco. She reports that she does not drink alcohol or use illicit drugs.  ALLERGIES: No Known Allergies  ROS: Review of Systems  Constitutional: Negative.   HENT: Negative for nosebleeds.   Eyes: Negative for blurred vision and double vision.  Respiratory: Negative for cough, hemoptysis, sputum production, shortness of breath  and wheezing.   Cardiovascular: Positive for palpitations. Negative for chest pain.  Gastrointestinal: Negative for nausea, vomiting, abdominal pain, constipation, blood in stool and melena.  Genitourinary: Negative for dysuria, frequency and flank pain.  Musculoskeletal: Negative.   Skin: Negative.   Neurological: Positive for dizziness and headaches.  Endo/Heme/Allergies: Bruises/bleeds easily.       On ASA  All other systems reviewed and are negative.  HOME MEDICATIONS:  No current facility-administered medications on file prior to encounter.   Current Outpatient Prescriptions on File Prior to Encounter  Medication Sig Dispense Refill  . atorvastatin (LIPITOR) 20 MG tablet Take 1 tablet (20 mg total) by mouth daily.  90 tablet  3  . b complex vitamins tablet Take 1 tablet by mouth daily.      Marland Kitchen BIOTIN 5000 PO Take 1 tablet by mouth daily.       . Calcium Carbonate (CALCIUM 600 PO) Take 1 tablet by mouth daily.      . Cholecalciferol (VITAMIN D) 2000 UNITS CAPS Take 2,000 Units by mouth daily.       Marland Kitchen EPINEPHrine (EPIPEN) 0.3 mg/0.3 mL DEVI Inject 0.3 mg into the muscle once.        . finasteride (PROSCAR) 5 MG tablet Take 0.5 tablets (2.5 mg total) by mouth daily.  90 tablet  3  . hydrochlorothiazide (HYDRODIURIL) 25 MG tablet Take 1 tablet (25 mg total) by mouth daily.  90 tablet  3  . Magnesium 250 MG TABS Take 1 tablet  by mouth 2 (two) times daily.      . metoprolol (LOPRESSOR) 50 MG tablet -- today by PCP Take 1 tablet (50 mg total) by mouth 2 (two) times daily.  180 tablet  3  . Multiple Vitamin (MULTIVITAMIN) tablet Take 1 tablet by mouth daily.      Marland Kitchen NASONEX 50 MCG/ACT nasal spray Place 2 sprays into the nose daily.       . potassium chloride SA (K-DUR,KLOR-CON) 20 MEQ tablet Take 1 tablet (20 mEq total) by mouth daily.  30 tablet  3  Rivaroxaban - 20 mg just written today by PCP  HOSPITAL MEDICATIONS: None  VITALS: Blood pressure 160/53, pulse 79, temperature 98.6 F (37  C), temperature source Oral, resp. rate 18, SpO2 100.00%.  PHYSICAL EXAM: Physical Exam  Vitals reviewed. Constitutional: She is oriented to person, place, and time. She appears well-developed and well-nourished. No distress.  HENT:  Head: Normocephalic and atraumatic.  Nose: Nose normal.  Mouth/Throat: Oropharynx is clear and moist.  Eyes: Conjunctivae are normal. Pupils are equal, round, and reactive to light.  Neck: Normal range of motion. Normal carotid pulses, no hepatojugular reflux and no JVD present. No tracheal deviation present. No thyromegaly present.  Cardiovascular: Normal heart sounds and intact distal pulses.  Exam reveals no gallop and no friction rub.   No murmur heard. Rate varies - mostly SR, but she did have 2 short spell of RVR ~140 bpm - spontaneously converted.  Frequent PACs.  Pulmonary/Chest: Effort normal and breath sounds normal. No stridor. No respiratory distress. She has no wheezes. She has no rales. She exhibits no tenderness.  Abdominal: Soft. Bowel sounds are normal. She exhibits no distension and no mass. There is no tenderness. There is no rebound and no guarding.  Genitourinary:  Deferred  Musculoskeletal: She exhibits no edema and no tenderness.  Lymphadenopathy:    She has no cervical adenopathy.  Neurological: She is alert and oriented to person, place, and time. No cranial nerve deficit. Coordination normal.  Skin: Skin is warm and dry. She is not diaphoretic. No erythema.  Psychiatric: She has a normal mood and affect. Judgment and thought content normal.    LABS: Results for orders placed during the hospital encounter of 10/31/13 (from the past 48 hour(s))  CBC     Status: None   Collection Time    10/31/13 11:00 AM      Result Value Ref Range   WBC 4.7  4.0 - 10.5 K/uL   RBC 4.86  3.87 - 5.11 MIL/uL   Hemoglobin 14.5  12.0 - 15.0 g/dL   HCT 44.0  36.0 - 46.0 %   MCV 90.5  78.0 - 100.0 fL   MCH 29.8  26.0 - 34.0 pg   MCHC 33.0  30.0 -  36.0 g/dL   RDW 13.1  11.5 - 15.5 %   Platelets 270  150 - 400 K/uL  BASIC METABOLIC PANEL     Status: Abnormal   Collection Time    10/31/13 11:00 AM      Result Value Ref Range   Sodium 144  137 - 147 mEq/L   Potassium 4.1  3.7 - 5.3 mEq/L   Chloride 103  96 - 112 mEq/L   CO2 29  19 - 32 mEq/L   Glucose, Bld 95  70 - 99 mg/dL   BUN 18  6 - 23 mg/dL   Creatinine, Ser 0.69  0.50 - 1.10 mg/dL   Calcium 9.8  8.4 -  10.5 mg/dL   GFR calc non Af Amer 89 (*) >90 mL/min   GFR calc Af Amer >90  >90 mL/min   Comment: (NOTE)     The eGFR has been calculated using the CKD EPI equation.     This calculation has not been validated in all clinical situations.     eGFR's persistently <90 mL/min signify possible Chronic Kidney     Disease.   Anion gap 12  5 - 15  PRO B NATRIURETIC PEPTIDE     Status: Abnormal   Collection Time    10/31/13 11:00 AM      Result Value Ref Range   Pro B Natriuretic peptide (BNP) 183.4 (*) 0 - 125 pg/mL    IMAGING: Dg Chest 2 View  10/31/2013   CLINICAL DATA:  Tachycardia.  EXAM: CHEST  2 VIEW  COMPARISON:  12/25/2009  FINDINGS: The heart size and mediastinal contours are within normal limits. Both lungs are clear. There is a moderate thoracolumbar scoliosis.  IMPRESSION: No active cardiopulmonary disease.   Electronically Signed   By: Rozetta Nunnery M.D.   On: 10/31/2013 11:18    HOSPITAL DIAGNOSES: Principal Problem:   Atrial flutter with rapid ventricular response Active Problems:   Paroxysmal atrial flutter   Essential hypertension, benign   CAROTID BRUITS, BILATERAL   IMPRESSION: Very pleasant 63 year old woman with a history of hypertension and noncritical carotid artery disease as well as dyslipidemia who presents now with clear diagnoses paroxysmal atrial flutter on EKG. Thankfully she converted prior to arrival in emergency room. She did have a few brief episodes while I was examining her. As she is currently hemodynamically stable, continued  evaluation can be done as outpatient.  RECOMMENDATION:  I agree with the plan as delineated by her PCP:  Although less risk of normal block with atrial flutter than atrial fibrillation, she'll agree with granulation with Xarelto. This would allow for cardioversion if necessary.  Agree with beta blocker, however I will probably not started 50 twice a day simply because her resting heart rate upon arrival to the emergency room was 60 million today. I recommend titrating up to 50 twice a day starting 25 twice a day and increasing to 25/50 and then 50 twice a day.  With documented evidence of peripheral vascular disease and carotid arteries (albeit noncritical) she still has an increased likelihood of coronary artery disease,   therefore we'll evaluate for potential ischemic etiology with a Myoview as an outpatient.   We'll also perform an echocardiogram to ensure no structural abnormalities or significant LVH from hypertension.   She'll be scheduled to see Dr. Crissie Sickles from our Electrophysiology Department on September 15 802 evaluate potential treatment options once we have the results of the echo and nuclear stress test  Would consider the possibility of atrial flutter ablation as she would prefer not to be on long-term anticoagulation, and has had multiple episodes.  At this point, I think she can likely be discharged to home from the emergency room. I discussed these recommendations with the EDP.  She'll be notified as to the date and time of her appointment via telephone call  Time Spent Directly with Patient: 5 minutes   HARDING,DAVID W, M.D., M.S. Interventional Cardiologist   Pager # [phone] 10/31/2013

## 2013-10-31 NOTE — Progress Notes (Signed)
Pre visit review using our clinic review tool, if applicable. No additional management support is needed unless otherwise documented below in the visit note. 

## 2013-11-01 NOTE — ED Provider Notes (Signed)
Medical screening examination/treatment/procedure(s) were conducted as a shared visit with non-physician practitioner(s) and myself.  I personally evaluated the patient during the encounter.   EKG Interpretation   Date/Time:  Wednesday October 31 2013 10:42:24 EDT Ventricular Rate:  64 PR Interval:  148 QRS Duration: 80 QT Interval:  388 QTC Calculation: 400 R Axis:   65 Text Interpretation:  Normal sinus rhythm Normal ECG No significant change  since last tracing Confirmed by Antia Rahal  MD, Maddyn Lieurance ([id number]) on 10/31/2013  4:23:25 PM       Patient with intermittent A-flutter. D/w Cards, they recommend starting with metoprolol 25 BID instead of 50, as well as outpatient cards f/u.   Audree Camel, MD 11/01/13 [phone]

## 2013-11-05 ENCOUNTER — Ambulatory Visit (HOSPITAL_COMMUNITY): Payer: Managed Care, Other (non HMO) | Attending: Physician Assistant | Admitting: Radiology

## 2013-11-05 DIAGNOSIS — I4892 Unspecified atrial flutter: Secondary | ICD-10-CM

## 2013-11-05 DIAGNOSIS — R000 Tachycardia, unspecified: Secondary | ICD-10-CM | POA: Insufficient documentation

## 2013-11-05 DIAGNOSIS — I10 Essential (primary) hypertension: Secondary | ICD-10-CM | POA: Insufficient documentation

## 2013-11-05 DIAGNOSIS — E785 Hyperlipidemia, unspecified: Secondary | ICD-10-CM | POA: Insufficient documentation

## 2013-11-05 NOTE — Progress Notes (Signed)
Echocardiogram performed.  

## 2013-11-13 ENCOUNTER — Ambulatory Visit (HOSPITAL_COMMUNITY): Payer: Managed Care, Other (non HMO) | Attending: Cardiology | Admitting: Radiology

## 2013-11-13 VITALS — BP 153/76 | Ht 61.0 in | Wt 124.0 lb

## 2013-11-13 DIAGNOSIS — R079 Chest pain, unspecified: Secondary | ICD-10-CM | POA: Insufficient documentation

## 2013-11-13 DIAGNOSIS — I4892 Unspecified atrial flutter: Secondary | ICD-10-CM | POA: Diagnosis present

## 2013-11-13 DIAGNOSIS — R0602 Shortness of breath: Secondary | ICD-10-CM | POA: Insufficient documentation

## 2013-11-13 DIAGNOSIS — I779 Disorder of arteries and arterioles, unspecified: Secondary | ICD-10-CM | POA: Diagnosis not present

## 2013-11-13 DIAGNOSIS — I10 Essential (primary) hypertension: Secondary | ICD-10-CM | POA: Diagnosis not present

## 2013-11-13 DIAGNOSIS — Z954 Presence of other heart-valve replacement: Secondary | ICD-10-CM | POA: Insufficient documentation

## 2013-11-13 DIAGNOSIS — R002 Palpitations: Secondary | ICD-10-CM | POA: Insufficient documentation

## 2013-11-13 MED ORDER — TECHNETIUM TC 99M SESTAMIBI GENERIC - CARDIOLITE
30.0000 | Freq: Once | INTRAVENOUS | Status: AC | PRN
  Administered 2013-11-13: 30 via INTRAVENOUS

## 2013-11-13 MED ORDER — TECHNETIUM TC 99M SESTAMIBI GENERIC - CARDIOLITE
10.0000 | Freq: Once | INTRAVENOUS | Status: AC | PRN
  Administered 2013-11-13: 10 via INTRAVENOUS

## 2013-11-13 MED ORDER — REGADENOSON 0.4 MG/5ML IV SOLN
0.4000 mg | Freq: Once | INTRAVENOUS | Status: AC
  Administered 2013-11-13: 0.4 mg via INTRAVENOUS

## 2013-11-13 NOTE — Progress Notes (Signed)
[facility] SITE 3 NUCLEAR MED [address] [place], [place] [address] [phone]    Cardiology Nuclear Med Study  Chelsea Morgan is a 63 y.o. female     MRN : [id number]     DOB: [birth year]  Procedure Date: 11/13/2013  Nuclear Med Background Indication for Stress Test:  Evaluation for Ischemia and Post Hospital:Atrial flutter with RVR History:  AFLUTTER Cardiac Risk Factors: Carotid Disease and Hypertension  Symptoms:  Chest Pain, Palpitations and SOB   Nuclear Pre-Procedure Caffeine/Decaff Intake:  None NPO After: 7:00pm   Lungs:  clear O2 Sat: 99% on room air. IV 0.9% NS with Angio Cath:  22g  IV Site: R Forearm  IV Started by:  Cathlyn Parsons, RN  Chest Size (in):  34 Cup Size: C  Height:  (1.549 m)  Weight:  124 lb (56.246 kg)  BMI:  Body mass index is 23.44 kg/(m^2). Tech Comments:  No Metoprolol x 24 hrs    Nuclear Med Study 1 or 2 day study: 1 day  Stress Test Type:  Lexiscan  Reading MD: n/a  Order Authorizing Provider:  Dorathy Daft   Resting Radionuclide: Technetium 16m Sestamibi  Resting Radionuclide Dose: 11.0 mCi   Stress Radionuclide:  Technetium 30m Sestamibi  Stress Radionuclide Dose: 33.0 mCi           Stress Protocol Rest HR: 57 Stress HR: 88  Rest BP: 153/76 Stress BP: 131/78  Exercise Time (min): n/a METS: n/a   Predicted Max HR: 155 bpm % Max HR: 56.77 bpm Rate Pressure Product: 21308   Dose of Adenosine (mg):  n/a Dose of Lexiscan: 0.4 mg  Dose of Atropine (mg): n/a Dose of Dobutamine: n/a mcg/kg/min (at max HR)  Stress Test Technologist: Milana Na, EMT-P  Nuclear Technologist:  Harlow Asa, CNMT     Rest Procedure:  Myocardial perfusion imaging was performed at rest 45 minutes following the intravenous administration of Technetium 88m Sestamibi. Rest ECG: Sinus bradycardia rate 53, LVH, no other abnormalities  Stress Procedure:  The patient received IV Lexiscan 0.4 mg over 15-seconds.  Technetium 49m  Sestamibi injected at 30-seconds. This patient had sob, abdominal cramps, and a headache with the Lexiscan injection. Quantitative spect images were obtained after a 45 minute delay. Stress ECG: No significant changes, occasional PVC  QPS Raw Data Images:  Normal; no motion artifact; normal heart/lung ratio. Stress Images:  Normal homogeneous uptake in all areas of the myocardium. Rest Images:  Normal homogeneous uptake in all areas of the myocardium. Subtraction (SDS):  No evidence of ischemia. Transient Ischemic Dilatation (Normal <1.22):  1.14 Lung/Heart Ratio (Normal <0.45):  0.22  Quantitative Gated Spect Images QGS EDV:  73 ml QGS ESV:  25 ml  Impression Exercise Capacity:  Pharmacologic infusion BP Response:  Normal blood pressure response. Clinical Symptoms:  Typical symptoms with pharmacologic infusion ECG Impression:  No significant ST segment change suggestive of ischemia. Comparison with Prior Nuclear Study: No previous nuclear study performed  Overall Impression:  Low risk stress nuclear study with no ischemia identified.  LV Ejection Fraction: 65%.  LV Wall Motion:  NL LV Function; NL Wall Motion  Donato Schultz, MD

## 2013-11-20 ENCOUNTER — Ambulatory Visit (INDEPENDENT_AMBULATORY_CARE_PROVIDER_SITE_OTHER): Payer: Managed Care, Other (non HMO) | Admitting: Internal Medicine

## 2013-11-20 ENCOUNTER — Telehealth: Payer: Self-pay | Admitting: Internal Medicine

## 2013-11-20 ENCOUNTER — Encounter: Payer: Self-pay | Admitting: *Deleted

## 2013-11-20 ENCOUNTER — Encounter: Payer: Self-pay | Admitting: Internal Medicine

## 2013-11-20 VITALS — BP 124/78 | HR 57 | Ht 62.0 in | Wt 126.6 lb

## 2013-11-20 DIAGNOSIS — I4892 Unspecified atrial flutter: Secondary | ICD-10-CM

## 2013-11-20 NOTE — Telephone Encounter (Signed)
She is scheduled for 12/28/13  Will have labs drawn on 10/16 and get instruction sheet

## 2013-11-20 NOTE — Progress Notes (Signed)
HPI [name] is referred today by Dr. Kirtland Bouchard for evaluation of atrial flutter. She is a very pleasant 63 yo woman with a h/o palpitations found to have atrial flutter on ECG who was placed on metoprolol. Her symptoms have improved but not resolved. She has never had syncope. When she goes into atrial flutter she feels sob and palpitations but no chest pain. No edema. She has HTN. The episodes start and stop suddenly. They may last minutes or longer. She has normal perfusion scan and a normal echo. No Known Allergies   Current Outpatient Prescriptions  Medication Sig Dispense Refill  . atorvastatin (LIPITOR) 20 MG tablet Take 1 tablet (20 mg total) by mouth daily.  90 tablet  3  . b complex vitamins tablet Take 1 tablet by mouth daily.      Marland Kitchen BIOTIN 5000 PO Take 1 tablet by mouth daily.       . Calcium Carbonate (CALCIUM 600 PO) Take 1 tablet by mouth daily.      . Cholecalciferol (VITAMIN D) 2000 UNITS CAPS Take 2,000 Units by mouth daily.       Marland Kitchen EPINEPHrine (EPIPEN) 0.3 mg/0.3 mL DEVI Inject 0.3 mg into the muscle once.        . finasteride (PROSCAR) 5 MG tablet Take 0.5 tablets (2.5 mg total) by mouth daily.  90 tablet  3  . hydrochlorothiazide (HYDRODIURIL) 25 MG tablet Take 1 tablet (25 mg total) by mouth daily.  90 tablet  3  . Magnesium 250 MG TABS Take 1 tablet by mouth 2 (two) times daily.      . metoprolol (LOPRESSOR) 50 MG tablet Take 25 mg by mouth 2 (two) times daily.      . Multiple Vitamin (MULTIVITAMIN) tablet Take 1 tablet by mouth daily.      Marland Kitchen NASONEX 50 MCG/ACT nasal spray Place 2 sprays into the nose as needed (congestion).       Marland Kitchen POTASSIUM PO Take 1 capsule by mouth daily. OTC       No current facility-administered medications for this visit.     Past Medical History  Diagnosis Date  . ALLERGIC RHINITIS 01/12/2008  . CAROTID BRUITS, BILATERAL 04/01/2009  . Essential hypertension, benign 01/12/2008  . HYPERLIPIDEMIA 05/02/2007  . Rheumatic fever     ROS:   All systems reviewed and negative except as noted in the HPI.   Past Surgical History  Procedure Laterality Date  . Salpingectomy       Family History  Problem Relation Age of Onset  . Cancer Mother     stomach  . Stroke Father   . Cancer Paternal Aunt     colon     History   Social History  . Marital Status: Married    Spouse Name: N/A    Number of Children: N/A  . Years of Education: N/A   Occupational History  . Not on file.   Social History Main Topics  . Smoking status: Never Smoker   . Smokeless tobacco: Never Used  . Alcohol Use: No  . Drug Use: No  . Sexual Activity: Not on file   Other Topics Concern  . Not on file   Social History Narrative  . No narrative on file     BP 124/78  Pulse 57  Ht  (1.575 m)  Wt 126 lb 9.6 oz (57.425 kg)  BMI 23.15 kg/m2  Physical Exam:  Well appearing middle aged woman, NAD HEENT:  Unremarkable Neck:  No JVD, no thyromegally Back:  No CVA tenderness Lungs:  Clear with no wheezes HEART:  Regular rate rhythm, no murmurs, no rubs, no clicks Abd:  soft, positive bowel sounds, no organomegally, no rebound, no guarding Ext:  2 plus pulses, no edema, no cyanosis, no clubbing Skin:  No rashes no nodules Neuro:  CN II through XII intact, motor grossly intact  EKG - nsr Prior ECG's - atrial flutter with a 2:1 AV conduction.    Assess/Plan:

## 2013-11-20 NOTE — Addendum Note (Signed)
Addended by: Dennis Bast F on: 11/20/2013 12:40 PM   Modules accepted: Orders

## 2013-11-20 NOTE — Patient Instructions (Signed)
Your physician has recommended that you have an ablation. Catheter ablation is a medical procedure used to treat some cardiac arrhythmias (irregular heartbeats). During catheter ablation, a long, thin, flexible tube is put into a blood vessel in your groin (upper thigh), or neck. This tube is called an ablation catheter. It is then guided to your heart through the blood vessel. Radio frequency waves destroy small areas of heart tissue where abnormal heartbeats may cause an arrhythmia to start. Please see the instruction sheet given to you today.   Please call me when your decide on date  10/5,10/9,10/19,10/23,10/26,10/30  Leave a message for Tresa Endo and I will call you back  [phone]

## 2013-11-20 NOTE — Assessment & Plan Note (Signed)
I have discussed the treatment options with the patient. She would like to proceed with EP study and catheter ablation as she desires not to be on chronic anti-coagulation if possible. Her CHADSVASC score is 3. The risks/benefits/goals/expectations of catheter ablation have been reviewed and she wishes to proceed.

## 2013-11-20 NOTE — Telephone Encounter (Signed)
New problem   Pt need to speak to nurse concerning dates. Please call pt.

## 2013-12-21 ENCOUNTER — Other Ambulatory Visit (INDEPENDENT_AMBULATORY_CARE_PROVIDER_SITE_OTHER): Payer: Managed Care, Other (non HMO) | Admitting: *Deleted

## 2013-12-21 DIAGNOSIS — I4892 Unspecified atrial flutter: Secondary | ICD-10-CM

## 2013-12-21 LAB — CBC WITH DIFFERENTIAL/PLATELET
Basophils Absolute: 0 10*3/uL (ref 0.0–0.1)
Basophils Relative: 0.6 % (ref 0.0–3.0)
Eosinophils Absolute: 0.2 10*3/uL (ref 0.0–0.7)
Eosinophils Relative: 3.5 % (ref 0.0–5.0)
HCT: 44.2 % (ref 36.0–46.0)
Hemoglobin: 14.3 g/dL (ref 12.0–15.0)
Lymphocytes Relative: 26.7 % (ref 12.0–46.0)
Lymphs Abs: 1.4 10*3/uL (ref 0.7–4.0)
MCHC: 32.3 g/dL (ref 30.0–36.0)
MCV: 88.9 fl (ref 78.0–100.0)
Monocytes Absolute: 0.5 10*3/uL (ref 0.1–1.0)
Monocytes Relative: 8.8 % (ref 3.0–12.0)
Neutro Abs: 3.2 10*3/uL (ref 1.4–7.7)
Neutrophils Relative %: 60.4 % (ref 43.0–77.0)
PLATELETS: 211 10*3/uL (ref 150.0–400.0)
RBC: 4.97 Mil/uL (ref 3.87–5.11)
RDW: 13.8 % (ref 11.5–15.5)
WBC: 5.3 10*3/uL (ref 4.0–10.5)

## 2013-12-21 LAB — BASIC METABOLIC PANEL
BUN: 16 mg/dL (ref 6–23)
CALCIUM: 9.6 mg/dL (ref 8.4–10.5)
CO2: 28 mEq/L (ref 19–32)
Chloride: 104 mEq/L (ref 96–112)
Creatinine, Ser: 0.7 mg/dL (ref 0.4–1.2)
GFR: 83.64 mL/min (ref >60.00)
Glucose, Bld: 89 mg/dL (ref 70–99)
Potassium: 4 mEq/L (ref 3.5–5.1)
SODIUM: 139 meq/L (ref 135–145)

## 2013-12-27 ENCOUNTER — Encounter (HOSPITAL_COMMUNITY): Payer: Self-pay | Admitting: Pharmacy Technician

## 2013-12-28 ENCOUNTER — Encounter (HOSPITAL_COMMUNITY)
Admission: RE | Disposition: A | Discharge status: Home / Self Care | Payer: Self-pay | Source: Ambulatory Visit | Attending: Internal Medicine

## 2013-12-28 ENCOUNTER — Encounter (HOSPITAL_COMMUNITY): Payer: Self-pay

## 2013-12-28 ENCOUNTER — Telehealth: Payer: Self-pay | Admitting: Physician Assistant

## 2013-12-28 ENCOUNTER — Ambulatory Visit (HOSPITAL_COMMUNITY)
Admission: RE | Admit: 2013-12-28 | Discharge: 2013-12-28 | Disposition: A | Discharge status: Home / Self Care | Payer: Managed Care, Other (non HMO) | Source: Ambulatory Visit | Attending: Internal Medicine | Admitting: Internal Medicine

## 2013-12-28 DIAGNOSIS — I483 Typical atrial flutter: Secondary | ICD-10-CM | POA: Insufficient documentation

## 2013-12-28 DIAGNOSIS — Z79899 Other long term (current) drug therapy: Secondary | ICD-10-CM | POA: Diagnosis not present

## 2013-12-28 DIAGNOSIS — I4892 Unspecified atrial flutter: Secondary | ICD-10-CM | POA: Diagnosis present

## 2013-12-28 DIAGNOSIS — E785 Hyperlipidemia, unspecified: Secondary | ICD-10-CM | POA: Insufficient documentation

## 2013-12-28 DIAGNOSIS — I10 Essential (primary) hypertension: Secondary | ICD-10-CM

## 2013-12-28 HISTORY — PX: ABLATION: SHX5711

## 2013-12-28 HISTORY — DX: Unspecified atrial flutter: I48.92

## 2013-12-28 HISTORY — PX: ATRIAL FLUTTER ABLATION: SHX5733

## 2013-12-28 SURGERY — ATRIAL FLUTTER ABLATION
Anesthesia: LOCAL

## 2013-12-28 MED ORDER — SODIUM CHLORIDE 0.9 % IJ SOLN: 3.0000 mL | INTRAMUSCULAR | Status: DC | PRN

## 2013-12-28 MED ORDER — FENTANYL CITRATE 0.05 MG/ML IJ SOLN
INTRAMUSCULAR | Status: AC
  Filled 2013-12-28: qty 2

## 2013-12-28 MED ORDER — RIVAROXABAN 20 MG PO TABS: 20.0000 mg | ORAL_TABLET | Freq: Every day | ORAL | Status: DC

## 2013-12-28 MED ORDER — BUPIVACAINE HCL (PF) 0.25 % IJ SOLN
INTRAMUSCULAR | Status: AC
  Filled 2013-12-28: qty 60

## 2013-12-28 MED ORDER — SODIUM CHLORIDE 0.9 % IJ SOLN: 3.0000 mL | Freq: Two times a day (BID) | INTRAMUSCULAR | Status: DC

## 2013-12-28 MED ORDER — ATORVASTATIN CALCIUM 10 MG PO TABS: 10.0000 mg | ORAL_TABLET | Freq: Two times a day (BID) | ORAL | Status: DC

## 2013-12-28 MED ORDER — METOPROLOL TARTRATE 25 MG PO TABS
25.0000 mg | ORAL_TABLET | Freq: Two times a day (BID) | ORAL | Status: DC
  Administered 2013-12-28: 25 mg via ORAL
  Filled 2013-12-28: qty 1

## 2013-12-28 MED ORDER — MIDAZOLAM HCL 5 MG/5ML IJ SOLN
INTRAMUSCULAR | Status: AC
  Filled 2013-12-28: qty 5

## 2013-12-28 MED ORDER — HYDROCHLOROTHIAZIDE 25 MG PO TABS
25.0000 mg | ORAL_TABLET | Freq: Every day | ORAL | Status: DC
  Administered 2013-12-28: 25 mg via ORAL
  Filled 2013-12-28: qty 1

## 2013-12-28 MED ORDER — HEPARIN (PORCINE) IN NACL 2-0.9 UNIT/ML-% IJ SOLN
INTRAMUSCULAR | Status: AC
  Filled 2013-12-28: qty 500

## 2013-12-28 MED ORDER — ACETAMINOPHEN 325 MG PO TABS
650.0000 mg | ORAL_TABLET | ORAL | Status: DC | PRN
Start: 2013-12-28 — End: 2013-12-28

## 2013-12-28 MED ORDER — FINASTERIDE 5 MG PO TABS: 2.5000 mg | ORAL_TABLET | Freq: Every day | ORAL | Status: DC

## 2013-12-28 MED ORDER — SODIUM CHLORIDE 0.9 % IV SOLN: 250.0000 mL | INTRAVENOUS | Status: DC | PRN

## 2013-12-28 MED ORDER — ONDANSETRON HCL 4 MG/2ML IJ SOLN: 4.0000 mg | Freq: Four times a day (QID) | INTRAMUSCULAR | Status: DC | PRN

## 2013-12-28 MED ORDER — EDOXABAN TOSYLATE 60 MG PO TABS: 60.0000 mg | ORAL_TABLET | Freq: Every day | ORAL | Status: DC

## 2013-12-28 NOTE — Discharge Instructions (Signed)
PLEASE REMEMBER TO BRING ALL OF YOUR MEDICATIONS TO EACH OF YOUR FOLLOW-UP OFFICE VISITS.  PLEASE ATTEND ALL SCHEDULED FOLLOW-UP APPOINTMENTS.   Activity: Increase activity slowly as tolerated. You may shower, but no soaking baths (or swimming) for 1 week. No driving for 1 day. No lifting over 5 lbs for 1 week. No sexual activity for 1 week.   You May Return to Work: in 1 week (if applicable)  Wound Care: You may wash cath site gently with soap and water. Keep cath site clean and dry. If you notice pain, swelling, bleeding or pus at your cath site, please call [phone].     Site Care Refer to this sheet in the next few weeks. These instructions provide you with information on caring for yourself after your procedure. Your caregiver may also give you more specific instructions. Your treatment has been planned according to current medical practices, but problems sometimes occur. Call your caregiver if you have any problems or questions after your procedure. HOME CARE INSTRUCTIONS  You may shower 24 hours after the procedure. Remove the bandage (dressing) if present, and gently wash the site with plain soap and water. Gently pat the site dry.   Do not apply powder or lotion to the site.   Do not sit in a bathtub, swimming pool, or whirlpool for 5 to 7 days.   No bending, squatting, or lifting anything over 10 pounds (4.5 kg) as directed by your caregiver.   Inspect the site at least twice daily.   Do not drive home if you are discharged the same day of the procedure. Have someone else drive you.   You may drive 24 hours after the procedure unless otherwise instructed by your caregiver.  What to expect:  Any bruising will usually fade within 1 to 2 weeks.   Blood that collects in the tissue (hematoma) may be painful to the touch. It should usually decrease in size and tenderness within 1 to 2 weeks.  SEEK IMMEDIATE MEDICAL CARE IF:  You have unusual pain at the site or down the  affected limb.   You have redness, warmth, swelling, or pain at the site.   You have drainage (other than a small amount of blood on the dressing).   You have chills.   You have a fever or persistent symptoms for more than 72 hours.   You have a fever and your symptoms suddenly get worse.   Your leg becomes pale, cool, tingly, or numb.   You have heavy bleeding from the site. Hold pressure on the site.  Document Released: 03/27/2010 Document Revised: 02/11/2011 Document Reviewed: 03/27/2010 [facility] Patient Information 2012 [place], [place].

## 2013-12-28 NOTE — Progress Notes (Signed)
UR Completed Wentworth Edelen Graves-Bigelow, RN,BSN [phone]  

## 2013-12-28 NOTE — CV Procedure (Signed)
Electrophysiology procedure note   Procedure performed: Electrophysiologic study and catheter ablation of atrial flutter   Preoperative diagnoses: Atrial flutter with a rapid ventricular response   Postoperative diagnoses: Same as preoperative diagnoses   Description of procedure: After informed consent was obtained, the patient was taken to the diagnostic electrophysiology laboratory in the fasting state. After the usual preparation draping, intravenous Versed and fentanyl were used for sedation. A 6 French quadripolar catheter was inserted percutaneously into the right femoral vein and advanced to the his bundle. A 6 French hexapolar catheter was inserted percutaneously into the right jugular vein and advanced to the coronary sinus. Rapid atrial pacing was carried out from the coronary sinus at 600 ms and stepwise decreased down to 360 ms where AV block was demonstrated. Programmed atrial stimulation was then carried out at 600 ms. The S1S2 interval was decreased down to 300 ms where atrial refractoriness was observed. Additional rapid atrial pacing was then carried out down to 200 ms and initially atrial fib was induced. Atrial fib transitioned to typical atrial flutter at a cycle length of 250 ms. Mapping was carried out. Typical atrial flutter was demonstrated. A 7 French quadripolar ablation catheter, was inserted percutaneously into the right femoral vein, and advanced under fluoroscopic guidance to the right ventricle. Mapping between the right ventricle and inferior vena cava and the coronary sinus was carried out. 6 radiofrequency energy applications were delivered to the atrial flutter isthmus, resulting in creation of atrial flutter isthmus block with a stim to local atrial activation of 150 ms. At this point, the patient was observed for 45 minutes. During this time, rapid ventricular pacing was carried out from the right ventricle and stepwise decreased to 420 ms where VA block was  demonstrated. During rapid ventricular pacing, the atrial activation was midline and decremental. Program ventricular stimulation was carried out from the right ventricle at a pacing cycle length of 600 ms. The pacing interval was stepwise decreased from 540 ms down to 440 ms where VA block was demonstrated. During program ventricular stimulation, atrial activation was midline and decremental. Next rapid atrial pacing was carried out from the coronary sinus and the right atrium a pacing cycle length of 500 ms. The patient cycle length was decreased down to 360 ms AV block was demonstrated. Additional decrements were carried out down to 210 ms. There is no other inducible arrhythmias, and no inducible flutter. Finally program stimulation was carried out from the atrium at a pacing cycle length of 500 ms. The S1-S2 interval was stepwise decreased from 440 ms down to 300 ms with the Atrial refractoriness was observed.  At this point, the catheters were removed and the patient was returned to the recovery area for removal of his sheaths.  Complications: There were no immediate procedural complications  Description of the procedure.  1. Baseline ECG. The baseline ECG demonstrated nsr.  2. Baseline intervals. The atrial flutter cycle length was 250 ms. The HV interval was 41 ms. The QRS duration 94 ms. Following ablation, there was no change. 3. Rapid ventricular pacing. Rapid ventricular pacing was carried out from the right ventricle and stepwise decreased to 420 ms where VA block was demonstrated.  4. Program ventricular stimulation. Program ventricular stimulation was carried out from the right ventricle at a pacing cycle of 600 ms. The S1-S2 interval was stepwise decreased from 540 ms down to 400 ms where VA block was demonstrated. During program ventricular stimulation, the atrial activation sequence appeared midline and decremental.  5. Rapid atrial pacing. Following ablation, rapid atrial pacing was carried  out at 600 ms, and stepwise decrease down to 360 ms were A-V block was demonstrated. Additional pacing decrements were carried out down to 210 ms and there is no inducible atrial flutter following ablation. During rapid atrial pacing, there is no inducible SVT.  6. Programmed atrial stimulation. Programmed atrial stimulation was carried out from the atrium at a pacing cycle length of 500 ms. The S1-S2 interval was stepwise decreased down to 300 ms with the AV node ERP was observed. During programmed atrial stimulation, there was no inducible SVT.  7. Arrhythmias observed. Atrial flutter. Cycle length 250 ms. Method of initiation, present at EP study. Method of termination, catheter ablation. Also observed was atrial fib of uncertain clinical significance 8. Mapping. Mapping of the atrial flutter isthmus demonstrated typical counterclockwise tricuspid annular recurrent atrial flutter with a normal size and orientation of the atrial flutter isthmus.  9. Radiofrequency energy application. A total of 6 radiofrequency energy applications were delivered. During the third radiofrequency energy application, atrial flutter was terminated, and sinus rhythm restored. For bonus radiofrequency energy applications were then delivered.  Conclusion: Successful electrophysiologic study and catheter ablation of typical atrial flutter with 6 radiofrequency energy applications, creating bidirectional block in the atrial flutter isthmus.   EBL - 20 cc.  Chelsea ReevesGregg Morgan,M.D.

## 2013-12-28 NOTE — H&P (Signed)
HPI [name] is referred today by Dr. Kirtland BouchardK for evaluation of atrial fluttAlycia Morgan. She is a very pleasant 63 yo woman with a h/o palpitations found to have atrial flutter on ECG who was placed on metoprolol. Her symptoms have improved but not resolved. She has never had syncope. When she goes into atrial flutter she feels sob and palpitations but no chest pain. No edema. She has HTN. The episodes start and stop suddenly. They may last minutes or longer. She has normal perfusion scan and a normal echo. No Known Allergies      Current Outpatient Prescriptions   Medication  Sig  Dispense  Refill   .  atorvastatin (LIPITOR) 20 MG tablet  Take 1 tablet (20 mg total) by mouth daily.   90 tablet   3   .  b complex vitamins tablet  Take 1 tablet by mouth daily.         Marland Kitchen.  BIOTIN 5000 PO  Take 1 tablet by mouth daily.          .  Calcium Carbonate (CALCIUM 600 PO)  Take 1 tablet by mouth daily.         .  Cholecalciferol (VITAMIN D) 2000 UNITS CAPS  Take 2,000 Units by mouth daily.          Marland Kitchen.  EPINEPHrine (EPIPEN) 0.3 mg/0.3 mL DEVI  Inject 0.3 mg into the muscle once.           .  finasteride (PROSCAR) 5 MG tablet  Take 0.5 tablets (2.5 mg total) by mouth daily.   90 tablet   3   .  hydrochlorothiazide (HYDRODIURIL) 25 MG tablet  Take 1 tablet (25 mg total) by mouth daily.   90 tablet   3   .  Magnesium 250 MG TABS  Take 1 tablet by mouth 2 (two) times daily.         .  metoprolol (LOPRESSOR) 50 MG tablet  Take 25 mg by mouth 2 (two) times daily.         .  Multiple Vitamin (MULTIVITAMIN) tablet  Take 1 tablet by mouth daily.         Marland Kitchen.  NASONEX 50 MCG/ACT nasal spray  Place 2 sprays into the nose as needed (congestion).          Marland Kitchen.  POTASSIUM PO  Take 1 capsule by mouth daily. OTC             No current facility-administered medications for this visit.           Past Medical History   Diagnosis  Date   .  ALLERGIC RHINITIS  01/12/2008   .  CAROTID BRUITS, BILATERAL  04/01/2009   .   Essential hypertension, benign  01/12/2008   .  HYPERLIPIDEMIA  05/02/2007   .  Rheumatic fever          ROS:    All systems reviewed and negative except as noted in the HPI.      Past Surgical History   Procedure  Laterality  Date   .  Salpingectomy               Family History   Problem  Relation  Age of Onset   .  Cancer  Mother         stomach   .  Stroke  Father     .  Cancer  Paternal  Aunt         colon           History       Social History   .  Marital Status:  Married       Spouse Name:  N/A       Number of Children:  N/A   .  Years of Education:  N/A       Occupational History   .  Not on file.       Social History Main Topics   .  Smoking status:  Never Smoker    .  Smokeless tobacco:  Never Used   .  Alcohol Use:  No   .  Drug Use:  No   .  Sexual Activity:  Not on file       Other Topics  Concern   .  Not on file       Social History Narrative   .  No narrative on file          BP 124/78  Pulse 57  Ht 5\' 2"  (1.575 m)  Wt 126 lb 9.6 oz (57.425 kg)  BMI 23.15 kg/m2   Physical Exam:   Well appearing middle aged woman, NAD HEENT: Unremarkable Neck:  No JVD, no thyromegally Back:  No CVA tenderness Lungs:  Clear with no wheezes HEART:  Regular rate rhythm, no murmurs, no rubs, no clicks Abd:  soft, positive bowel sounds, no organomegally, no rebound, no guarding Ext:  2 plus pulses, no edema, no cyanosis, no clubbing Skin:  No rashes no nodules Neuro:  CN II through XII intact, motor grossly intact   EKG - nsr Prior ECG's - atrial flutter with a 2:1 AV conduction.      Assess/Plan:         Atrial flutter with rapid ventricular response-  I have discussed the treatment options with the patient. She would like to proceed with EP study and catheter ablation as she desires not to be on chronic anti-coagulation if possible. Her CHADSVASC score is 3. The risks/benefits/goals/expectations of catheter ablation have  been discussed with the patient and she wishes to proceed.  Chelsea ReevesGregg Osric Morgan,M.D.

## 2013-12-28 NOTE — Discharge Summary (Signed)
CARDIOLOGY DISCHARGE SUMMARY   Patient ID: Chelsea Morgan MRN: [id number] DOB/AGE: 65/09/1950 63 y.o.  Admit date: 12/28/2013 Discharge date: 12/28/2013  PCP: Rogelia BogaKWIATKOWSKI,PETER FRANK, MD Primary Cardiologist: Dr. Ladona Ridgelaylor  Primary Discharge Diagnosis:  Atrial flutter, RVR  Procedures: Electrophysiologic study and catheter ablation of atrial flutter   Hospital Course: Chelsea Morgan is a 63 y.o. female with a history of atrial flutter, rapid ventricular response. She was evaluated by Dr. Ladona Ridgelaylor and scheduled for ablation. She came to the hospital for the procedure on 12/28/2013.  She had successful EP study with RFCA of her atrial flutter, results below. She tolerated the procedure well.  Post-procedure, she recovered from the sedation and was ambulating without chest pain or SOB. She was maintaining SR and had no SOB. She was considered stable for discharge, to follow up as an outpatient.  Regarding anticoagulation, Savaysa was initially ordered. However, her pharmacy does not have the medication. Therefore, Xarelto was added to her medication regimen.  Labs:   Lab Results  Component Value Date   WBC 5.3 12/21/2013   HGB 14.3 12/21/2013   HCT 44.2 12/21/2013   MCV 88.9 12/21/2013   PLT 211.0 12/21/2013    EP Procedure: 12/28/2013 Description of the procedure.  1. Baseline ECG. The baseline ECG demonstrated nsr.  2. Baseline intervals. The atrial flutter cycle length was 250 ms. The HV interval was 41 ms. The QRS duration 94 ms. Following ablation, there was no change.  3. Rapid ventricular pacing. Rapid ventricular pacing was carried out from the right ventricle and stepwise decreased to 420 ms where VA block was demonstrated.  4. Program ventricular stimulation. Program ventricular stimulation was carried out from the right ventricle at a pacing cycle of 600 ms. The S1-S2 interval was stepwise decreased from 540 ms down to 400 ms where VA block was demonstrated. During  program ventricular stimulation, the atrial activation sequence appeared midline and decremental.  5. Rapid atrial pacing. Following ablation, rapid atrial pacing was carried out at 600 ms, and stepwise decrease down to 360 ms were A-V block was demonstrated. Additional pacing decrements were carried out down to 210 ms and there is no inducible atrial flutter following ablation. During rapid atrial pacing, there is no inducible SVT.  6. Programmed atrial stimulation. Programmed atrial stimulation was carried out from the atrium at a pacing cycle length of 500 ms. The S1-S2 interval was stepwise decreased down to 300 ms with the AV node ERP was observed. During programmed atrial stimulation, there was no inducible SVT.  7. Arrhythmias observed. Atrial flutter. Cycle length 250 ms. Method of initiation, present at EP study. Method of termination, catheter ablation. Also observed was atrial fib of uncertain clinical significance  8. Mapping. Mapping of the atrial flutter isthmus demonstrated typical counterclockwise tricuspid annular recurrent atrial flutter with a normal size and orientation of the atrial flutter isthmus.  9. Radiofrequency energy application. A total of 6 radiofrequency energy applications were delivered. During the third radiofrequency energy application, atrial flutter was terminated, and sinus rhythm restored. For bonus radiofrequency energy applications were then delivered.  Conclusion: Successful electrophysiologic study and catheter ablation of typical atrial flutter with 6 radiofrequency energy applications, creating bidirectional block in the atrial flutter isthmus.   FOLLOW UP PLANS AND APPOINTMENTS No Known Allergies   Medication List         atorvastatin 20 MG tablet  Commonly known as:  LIPITOR  Take 10 mg by mouth 2 (two) times daily.  b complex vitamins tablet  Take 1 tablet by mouth daily.     BIOTIN 5000 PO  Take 1 tablet by mouth daily.     CALCIUM 600 PO   Take 1 tablet by mouth daily.     EPIPEN 0.3 mg/0.3 mL Devi  Generic drug:  EPINEPHrine  Inject 0.3 mg into the muscle once.     finasteride 5 MG tablet  Commonly known as:  PROSCAR  Take 0.5 tablets (2.5 mg total) by mouth daily.     hydrochlorothiazide 25 MG tablet  Commonly known as:  HYDRODIURIL  Take 1 tablet (25 mg total) by mouth daily.     KRILL OIL PO  Take 1 capsule by mouth 2 (two) times a week.     Magnesium 250 MG Tabs  Take 1 tablet by mouth 2 (two) times daily.     metoprolol 50 MG tablet  Commonly known as:  LOPRESSOR  Take 25 mg by mouth 2 (two) times daily.     multivitamin tablet  Take 1 tablet by mouth daily.     NASONEX 50 MCG/ACT nasal spray  Generic drug:  mometasone  Place 2 sprays into the nose as needed (congestion).     POTASSIUM PO  Take 1 capsule by mouth daily. OTC     rivaroxaban 20 MG Tabs tablet  Commonly known as:  XARELTO  Take 1 tablet (20 mg total) by mouth daily.     Vitamin D 2000 UNITS Caps  Take 4,000-6,000 Units by mouth daily.        Discharge Instructions   Diet - low sodium heart healthy    Complete by:  As directed      Increase activity slowly    Complete by:  As directed           Follow-up Information   Follow up with Lewayne BuntingGregg Taylor, MD. (As scheduled)    Specialty:  Cardiology   Contact information:   [address] [place] [address] [phone]       BRING ALL MEDICATIONS WITH YOU TO FOLLOW UP APPOINTMENTS  Time spent with patient to include physician time: 29 min Signed: Theodore Demarkhonda Barrett, PA-C 12/28/2013, 8:38 PM Co-Sign MD  EP Attending  Patient seen and examined. Agree with above.  Leonia ReevesGregg Taylor,M.D.

## 2013-12-28 NOTE — Progress Notes (Signed)
Site area: RT IJ Site Prior to Removal:  Level 0 Pressure Applied For:5 min Manual:    Patient Status During Pull:  stable Post Pull Site:  Level 0 Post Pull Instructions Given:  yes Post Pull Pulses Present:  Dressing Applied:  bandaid Bedrest begins @ 1010 Comments:no complications

## 2013-12-28 NOTE — Progress Notes (Signed)
Site area: RFV x 3 Site Prior to Removal:  Level  0 Pressure Applied For: 15 min Manual:    Patient Status During Pull:  stable Post Pull Site:  Level 0 Post Pull Instructions Given:  yes Post Pull Pulses Present: rt pt palpable Dressing Applied:  bandaid Bedrest begins @ 1010 Comments:no complications

## 2013-12-29 ENCOUNTER — Encounter (HOSPITAL_COMMUNITY): Payer: Self-pay | Admitting: *Deleted

## 2014-01-24 ENCOUNTER — Encounter: Payer: Self-pay | Admitting: Internal Medicine

## 2014-01-24 ENCOUNTER — Ambulatory Visit (INDEPENDENT_AMBULATORY_CARE_PROVIDER_SITE_OTHER): Payer: Managed Care, Other (non HMO) | Admitting: Internal Medicine

## 2014-01-24 VITALS — BP 146/74 | HR 63 | Ht 62.0 in | Wt 131.0 lb

## 2014-01-24 DIAGNOSIS — I10 Essential (primary) hypertension: Secondary | ICD-10-CM

## 2014-01-24 DIAGNOSIS — I4892 Unspecified atrial flutter: Secondary | ICD-10-CM

## 2014-01-24 NOTE — Patient Instructions (Signed)
Your physician recommends that you schedule a follow-up appointment in: 3 months with Dr Taylor  

## 2014-01-24 NOTE — Assessment & Plan Note (Signed)
Her blood pressure is slightly elevated today. Hopefully this will improve with carvedilol.

## 2014-01-24 NOTE — Assessment & Plan Note (Signed)
She is doing well status post catheter ablation. We will plan to switch her from metoprolol to carvedilol.

## 2014-01-24 NOTE — Progress Notes (Signed)
HPI [name] returns today for follow-up. She is a 63 year old woman with hypertension and atrial flutter, who underwent catheter ablation several weeks ago. The patient had atrial fibrillation during her ablation procedure, of uncertain clinical significance. Since her procedure, she has had rare palpitations. She complains of fatigue and weakness. I initially placed her on Xarelto, but she has stopped this medication and switched to aspirin 81 mg daily. No chest pain or shortness of breath. No Known Allergies   Current Outpatient Prescriptions  Medication Sig Dispense Refill  . atorvastatin (LIPITOR) 20 MG tablet Take 10 mg by mouth 2 (two) times daily.    Marland Kitchen. b complex vitamins tablet Take 1 tablet by mouth daily.    Marland Kitchen. BIOTIN 5000 PO Take 1 tablet by mouth daily.     . Calcium Carbonate (CALCIUM 600 PO) Take 1 tablet by mouth daily.    . Cholecalciferol (VITAMIN D) 2000 UNITS CAPS Take 4,000-6,000 Units by mouth daily.     . finasteride (PROSCAR) 5 MG tablet Take 0.5 tablets (2.5 mg total) by mouth daily. 90 tablet 3  . hydrochlorothiazide (HYDRODIURIL) 25 MG tablet Take 1 tablet (25 mg total) by mouth daily. 90 tablet 3  . KRILL OIL PO Take 1 capsule by mouth 2 (two) times a week.    . Magnesium 250 MG TABS Take 1 tablet by mouth 2 (two) times daily.    . metoprolol (LOPRESSOR) 50 MG tablet Take 25 mg by mouth 2 (two) times daily.    . Multiple Vitamin (MULTIVITAMIN) tablet Take 1 tablet by mouth daily.    Marland Kitchen. NASONEX 50 MCG/ACT nasal spray Place 2 sprays into the nose daily as needed (congestion).     Marland Kitchen. POTASSIUM PO Take 1 capsule by mouth daily. OTC    . EPINEPHrine (EPIPEN) 0.3 mg/0.3 mL DEVI Inject 0.3 mg into the muscle once.       No current facility-administered medications for this visit.     Past Medical History  Diagnosis Date  . ALLERGIC RHINITIS 01/12/2008  . CAROTID BRUITS, BILATERAL 04/01/2009  . Essential hypertension, benign 01/12/2008  . HYPERLIPIDEMIA  05/02/2007  . Rheumatic fever   . Atrial flutter     ROS:   All systems reviewed and negative except as noted in the HPI.   Past Surgical History  Procedure Laterality Date  . Salpingectomy    . Ablation  12-28-13    atrial flutter ablation by Dr Ladona Ridgelaylor     Family History  Problem Relation Age of Onset  . Cancer Mother     stomach  . Stroke Father   . Cancer Paternal Aunt     colon     History   Social History  . Marital Status: Married    Spouse Name: N/A    Number of Children: N/A  . Years of Education: N/A   Occupational History  . Not on file.   Social History Main Topics  . Smoking status: Never Smoker   . Smokeless tobacco: Never Used  . Alcohol Use: No  . Drug Use: No  . Sexual Activity: Not on file   Other Topics Concern  . Not on file   Social History Narrative     BP 146/74 mmHg  Pulse 63  Ht 5\' 2"  (1.575 m)  Wt 131 lb (59.421 kg)  BMI 23.95 kg/m2  Physical Exam:  Well appearing 63 year old woman, NAD HEENT: Unremarkable Neck:  No JVD, no thyromegally Back:  No CVA  tenderness Lungs:  Clear with no wheezes, rales, or rhonchi. HEART:  Regular rate rhythm, no murmurs, no rubs, no clicks Abd:  soft, positive bowel sounds, no organomegally, no rebound, no guarding Ext:  2 plus pulses, no edema, no cyanosis, no clubbing Skin:  No rashes no nodules Neuro:  CN II through XII intact, motor grossly intact  EKG - normal sinus rhythm with occasional PACs.  Assess/Plan:

## 2014-01-28 ENCOUNTER — Telehealth: Payer: Self-pay | Admitting: Internal Medicine

## 2014-01-28 NOTE — Telephone Encounter (Signed)
New Message      Pt calling stating Dr. Ladona Ridgelaylor was going to call in a new RX for her and it has yet to be called in. Please call pt and advise.

## 2014-01-28 NOTE — Telephone Encounter (Signed)
Spoke with patient and let her know I would have to ask him what dose tomorrow and call in for her

## 2014-01-29 MED ORDER — CARVEDILOL 12.5 MG PO TABS: 12.5000 mg | ORAL_TABLET | Freq: Two times a day (BID) | ORAL | Status: DC

## 2014-01-29 NOTE — Telephone Encounter (Signed)
Spoke with Dr. Ladona RidgelAylor and he has recommended Carvedilol 12.5mg  twice daily.  I have called the patient and she is aware the medicine has been called into her pharmacy

## 2014-02-14 ENCOUNTER — Encounter (HOSPITAL_COMMUNITY): Payer: Self-pay | Admitting: Internal Medicine

## 2014-04-30 ENCOUNTER — Encounter: Payer: Self-pay | Admitting: Internal Medicine

## 2014-04-30 ENCOUNTER — Ambulatory Visit (INDEPENDENT_AMBULATORY_CARE_PROVIDER_SITE_OTHER): Payer: Managed Care, Other (non HMO) | Admitting: Internal Medicine

## 2014-04-30 VITALS — BP 162/80 | HR 65 | Ht 62.0 in | Wt 130.2 lb

## 2014-04-30 DIAGNOSIS — I483 Typical atrial flutter: Secondary | ICD-10-CM

## 2014-04-30 DIAGNOSIS — I4892 Unspecified atrial flutter: Secondary | ICD-10-CM

## 2014-04-30 DIAGNOSIS — I10 Essential (primary) hypertension: Secondary | ICD-10-CM

## 2014-04-30 MED ORDER — CARVEDILOL 12.5 MG PO TABS: 6.2500 mg | ORAL_TABLET | Freq: Two times a day (BID) | ORAL | Status: DC

## 2014-04-30 NOTE — Progress Notes (Signed)
HPI Chelsea Morgan returns today for follow-up. She is a 64 year old woman with hypertension and atrial flutter, who underwent catheter ablation several months ago. The patient had atrial fibrillation during her ablation procedure, of uncertain clinical significance. Since her procedure, she has had rare palpitations. I initially placed her on Xarelto, but she has stopped this medication and switched to aspirin 81 mg daily. No chest pain or shortness of breath. She complains of difficulty with the higher dose of carvedilol. She thinks it is affecting her vision and causing her eyes to be dry. No Known Allergies   Current Outpatient Prescriptions  Medication Sig Dispense Refill  . atorvastatin (LIPITOR) 20 MG tablet Take 10 mg by mouth 2 (two) times daily.    Marland Kitchen. b complex vitamins tablet Take 1 tablet by mouth daily.    Marland Kitchen. BIOTIN 5000 PO Take 1 tablet by mouth daily.     . Calcium Carbonate (CALCIUM 600 PO) Take 1 tablet by mouth daily.    . carvedilol (COREG) 12.5 MG tablet Take 1 tablet (12.5 mg total) by mouth 2 (two) times daily. 180 tablet 3  . Cholecalciferol (VITAMIN D) 2000 UNITS CAPS Take 4,000-6,000 Units by mouth daily.     Marland Kitchen. EPINEPHrine (EPIPEN) 0.3 mg/0.3 mL DEVI Inject 0.3 mg into the muscle once.      . finasteride (PROSCAR) 5 MG tablet Take 0.5 tablets (2.5 mg total) by mouth daily. 90 tablet 3  . hydrochlorothiazide (HYDRODIURIL) 25 MG tablet Take 1 tablet (25 mg total) by mouth daily. 90 tablet 3  . KRILL OIL PO Take 1 capsule by mouth 2 (two) times a week.    . Magnesium 250 MG TABS Take 1 tablet by mouth 2 (two) times daily.    . Multiple Vitamin (MULTIVITAMIN) tablet Take 1 tablet by mouth daily.    Marland Kitchen. NASONEX 50 MCG/ACT nasal spray Place 2 sprays into the nose daily as needed (congestion).     Marland Kitchen. POTASSIUM PO Take 1 capsule by mouth daily. OTC     No current facility-administered medications for this visit.     Past Medical History  Diagnosis Date  . ALLERGIC  RHINITIS 01/12/2008  . CAROTID BRUITS, BILATERAL 04/01/2009  . Essential hypertension, benign 01/12/2008  . HYPERLIPIDEMIA 05/02/2007  . Rheumatic fever   . Atrial flutter     ROS:   All systems reviewed and negative except as noted in the HPI.   Past Surgical History  Procedure Laterality Date  . Salpingectomy    . Ablation  12-28-13    atrial flutter ablation by Dr Ladona Ridgelaylor  . Atrial flutter ablation N/A 12/28/2013    Procedure: ATRIAL FLUTTER ABLATION;  Surgeon: Marinus MawGregg W Taylor, MD;  [facility];  Service: Cardiovascular;  Laterality: N/A;     Family History  Problem Relation Age of Onset  . Cancer Mother     stomach  . Stroke Father   . Cancer Paternal Aunt     colon     History   Social History  . Marital Status: Married    Spouse Name: N/A  . Number of Children: N/A  . Years of Education: N/A   Occupational History  . Not on file.   Social History Main Topics  . Smoking status: Never Smoker   . Smokeless tobacco: Never Used  . Alcohol Use: No  . Drug Use: No  . Sexual Activity: Not on file   Other Topics Concern  . Not on file  Social History Narrative     BP 162/80 mmHg  Pulse 65  Ht  (1.575 m)  Wt 130 lb 3.2 oz (59.058 kg)  BMI 23.81 kg/m2  Physical Exam: Blood pressure 154/78 on my exam Well appearing 64 year old woman, NAD HEENT: Unremarkable Neck:  No JVD, no thyromegally Back:  No CVA tenderness Lungs:  Clear with no wheezes, rales, or rhonchi. HEART:  Regular rate rhythm, no murmurs, no rubs, no clicks Abd:  soft, positive bowel sounds, no organomegally, no rebound, no guarding Ext:  2 plus pulses, no edema, no cyanosis, no clubbing Skin:  No rashes no nodules Neuro:  CN II through XII intact, motor grossly intact  EKG - normal sinus rhythm  Assess/Plan:

## 2014-04-30 NOTE — Assessment & Plan Note (Signed)
She is maintaining sinus rhythm. She will continue her current medical therapy.

## 2014-04-30 NOTE — Patient Instructions (Addendum)
Your physician wants you to follow-up in: 6 months with Dr Court Joyaylor You will receive a reminder letter in the mail two months in advance. If you don't receive a letter, please call our office to schedule the follow-up appointment.   Your physician has recommended you make the following change in your medication:  1) Decrease Carvedilol to 6.25 mg twice daily

## 2014-04-30 NOTE — Assessment & Plan Note (Signed)
Her blood pressure is elevated today. I've asked the patient to change her carvedilol to half tablet twice daily. She is currently only taking the medication once a day. She is encouraged to reduce her salt intake. She exercises regularly. At home, her blood pressure is well controlled.

## 2014-05-29 ENCOUNTER — Telehealth: Payer: Self-pay | Admitting: Internal Medicine

## 2014-05-29 NOTE — Telephone Encounter (Signed)
Flu shot updated. 

## 2014-05-29 NOTE — Telephone Encounter (Signed)
FYI pt had flu shot at [place] in [employer]

## 2014-07-31 ENCOUNTER — Telehealth: Payer: Self-pay | Admitting: Family Medicine

## 2014-07-31 ENCOUNTER — Encounter: Payer: Self-pay | Admitting: Internal Medicine

## 2014-07-31 ENCOUNTER — Ambulatory Visit (INDEPENDENT_AMBULATORY_CARE_PROVIDER_SITE_OTHER): Payer: Managed Care, Other (non HMO) | Admitting: Internal Medicine

## 2014-07-31 DIAGNOSIS — I10 Essential (primary) hypertension: Secondary | ICD-10-CM

## 2014-07-31 DIAGNOSIS — M5440 Lumbago with sciatica, unspecified side: Secondary | ICD-10-CM | POA: Diagnosis not present

## 2014-07-31 MED ORDER — METHYLPREDNISOLONE ACETATE 80 MG/ML IJ SUSP
80.0000 mg | Freq: Once | INTRAMUSCULAR | Status: AC
  Administered 2014-07-31: 80 mg via INTRAMUSCULAR

## 2014-07-31 MED ORDER — HYDROCODONE-ACETAMINOPHEN 5-325 MG PO TABS: 1.0000 | ORAL_TABLET | Freq: Four times a day (QID) | ORAL | Status: DC | PRN

## 2014-07-31 NOTE — Progress Notes (Signed)
Subjective:    Patient ID: Chelsea Morgan, female    DOB: 12/28/1950, 64 y.o.   MRN: [id number]  HPI  64 year old patient who has essential hypertension.  Last week she had a URI with considerable coughing.  3 days ago, she was at a playground with the grandchildren and developed low back pain while using a slide.  She has had some right lumbar pain with radiation down the left leg.  No motor weakness.  No history of chronic low back pain  Past Medical History  Diagnosis Date  . ALLERGIC RHINITIS 01/12/2008  . CAROTID BRUITS, BILATERAL 04/01/2009  . Essential hypertension, benign 01/12/2008  . HYPERLIPIDEMIA 05/02/2007  . Rheumatic fever   . Atrial flutter     History   Social History  . Marital Status: Married    Spouse Name: N/A  . Number of Children: N/A  . Years of Education: N/A   Occupational History  . Not on file.   Social History Main Topics  . Smoking status: Never Smoker   . Smokeless tobacco: Never Used  . Alcohol Use: No  . Drug Use: No  . Sexual Activity: Not on file   Other Topics Concern  . Not on file   Social History Narrative    Past Surgical History  Procedure Laterality Date  . Salpingectomy    . Ablation  12-28-13    atrial flutter ablation by Dr Ladona Ridgelaylor  . Atrial flutter ablation N/A 12/28/2013    Procedure: ATRIAL FLUTTER ABLATION;  Surgeon: Marinus MawGregg W Taylor, MD;  [facility];  Service: Cardiovascular;  Laterality: N/A;    Family History  Problem Relation Age of Onset  . Cancer Mother     stomach  . Stroke Father   . Cancer Paternal Aunt     colon    No Known Allergies  Current Outpatient Prescriptions on File Prior to Visit  Medication Sig Dispense Refill  . atorvastatin (LIPITOR) 20 MG tablet Take 10 mg by mouth 2 (two) times daily.    Marland Kitchen. b complex vitamins tablet Take 1 tablet by mouth daily.    Marland Kitchen. BIOTIN 5000 PO Take 1 tablet by mouth daily.     . Calcium Carbonate (CALCIUM 600 PO) Take 1 tablet by mouth daily.    .  carvedilol (COREG) 12.5 MG tablet Take 0.5 tablets (6.25 mg total) by mouth 2 (two) times daily. 180 tablet 3  . Cholecalciferol (VITAMIN D) 2000 UNITS CAPS Take 4,000-6,000 Units by mouth daily.     Marland Kitchen. EPINEPHrine (EPIPEN) 0.3 mg/0.3 mL DEVI Inject 0.3 mg into the muscle once.      . finasteride (PROSCAR) 5 MG tablet Take 0.5 tablets (2.5 mg total) by mouth daily. 90 tablet 3  . hydrochlorothiazide (HYDRODIURIL) 25 MG tablet Take 1 tablet (25 mg total) by mouth daily. 90 tablet 3  . Magnesium 250 MG TABS Take 1 tablet by mouth 2 (two) times daily.    . Multiple Vitamin (MULTIVITAMIN) tablet Take 1 tablet by mouth daily.    Marland Kitchen. NASONEX 50 MCG/ACT nasal spray Place 2 sprays into the nose daily as needed (congestion).     Marland Kitchen. POTASSIUM PO Take 1 capsule by mouth daily. OTC     No current facility-administered medications on file prior to visit.    BP 130/72 mmHg  Pulse 75  Temp(Src) 98.9 F (37.2 C) (Oral)  Resp 20  Ht 5\' 2"  (1.575 m)  Wt 127 lb (57.607 kg)  BMI 23.22 kg/m2  SpO2 98%     Review of Systems  Constitutional: Negative.   HENT: Negative for congestion, dental problem, hearing loss, rhinorrhea, sinus pressure, sore throat and tinnitus.   Eyes: Negative for pain, discharge and visual disturbance.  Respiratory: Negative for cough and shortness of breath.   Cardiovascular: Negative for chest pain, palpitations and leg swelling.  Gastrointestinal: Negative for nausea, vomiting, abdominal pain, diarrhea, constipation, blood in stool and abdominal distention.  Genitourinary: Negative for dysuria, urgency, frequency, hematuria, flank pain, vaginal bleeding, vaginal discharge, difficulty urinating, vaginal pain and pelvic pain.  Musculoskeletal: Positive for back pain. Negative for joint swelling, arthralgias and gait problem.  Skin: Negative for rash.  Neurological: Negative for dizziness, syncope, speech difficulty, weakness, numbness and headaches.  Hematological: Negative for  adenopathy.  Psychiatric/Behavioral: Negative for behavioral problems, dysphoric mood and agitation. The patient is not nervous/anxious.        Objective:   Physical Exam  Constitutional: She is oriented to person, place, and time. She appears well-developed and well-nourished.  HENT:  Head: Normocephalic.  Right Ear: External ear normal.  Left Ear: External ear normal.  Mouth/Throat: Oropharynx is clear and moist.  Eyes: Conjunctivae and EOM are normal. Pupils are equal, round, and reactive to light.  Neck: Normal range of motion. Neck supple. No thyromegaly present.  Cardiovascular: Normal rate, regular rhythm, normal heart sounds and intact distal pulses.   Pulmonary/Chest: Effort normal and breath sounds normal.  Abdominal: Soft. Bowel sounds are normal. She exhibits no mass. There is no tenderness.  Musculoskeletal: Normal range of motion.  Negative straight leg test Ankle flexion and extension normal Able to walk on her toes and heels without difficulty Reflexes symmetrical  Lymphadenopathy:    She has no cervical adenopathy.  Neurological: She is alert and oriented to person, place, and time.  Skin: Skin is warm and dry. No rash noted.  Psychiatric: She has a normal mood and affect. Her behavior is normal.          Assessment & Plan:   Lumbar strain with mild sciatica.  Will treat with Depo-Medrol.  Rest and also with analgesics Hypertension, stable

## 2014-07-31 NOTE — Patient Instructions (Addendum)
Most patients with low back pain will improve with time over the next two to 6 weeks.  Keep active but avoid any activities that cause pain.    Back Injury Prevention Back injuries can be extremely painful and difficult to heal. After having one back injury, you are much more likely to experience another later on. It is important to learn how to avoid injuring or re-injuring your back. The following tips can help you to prevent a back injury. PHYSICAL FITNESS  Exercise regularly and try to develop good tone in your abdominal muscles. Your abdominal muscles provide a lot of the support needed by your back.  Do aerobic exercises (walking, jogging, biking, swimming) regularly.  Do exercises that increase balance and strength (tai chi, yoga) regularly. This can decrease your risk of falling and injuring your back.  Stretch before and after exercising.  Maintain a healthy weight. The more you weigh, the more stress is placed on your back. For every pound of weight, 10 times that amount of pressure is placed on the back. DIET  Talk to your caregiver about how much calcium and vitamin D you need per day. These nutrients help to prevent weakening of the bones (osteoporosis). Osteoporosis can cause broken (fractured) bones that lead to back pain.  Include good sources of calcium in your diet, such as dairy products, green, leafy vegetables, and products with calcium added (fortified).  Include good sources of vitamin D in your diet, such as milk and foods that are fortified with vitamin D.  Consider taking a nutritional supplement or a multivitamin if needed.  Stop smoking if you smoke. POSTURE  Sit and stand up straight. Avoid leaning forward when you sit or hunching over when you stand.  Choose chairs with good low back (lumbar) support.  If you work at a desk, sit close to your work so you do not need to lean over. Keep your chin tucked in. Keep your neck drawn back and elbows bent at a  right angle. Your arms should look like the letter "L."  Sit high and close to the steering wheel when you drive. Add a lumbar support to your car seat if needed.  Avoid sitting or standing in one position for too long. Take breaks to get up, stretch, and walk around at least once every hour. Take breaks if you are driving for long periods of time.  Sleep on your side with your knees slightly bent, or sleep on your back with a pillow under your knees. Do not sleep on your stomach. LIFTING, TWISTING, AND REACHING  Avoid heavy lifting, especially repetitive lifting. If you must do heavy lifting:  Stretch before lifting.  Work slowly.  Rest between lifts.  Use carts and dollies to move objects when possible.  Make several small trips instead of carrying 1 heavy load.  Ask for help when you need it.  Ask for help when moving big, awkward objects.  Follow these steps when lifting:  Stand with your feet shoulder-width apart.  Get as close to the object as you can. Do not try to pick up heavy objects that are far from your body.  Use handles or lifting straps if they are available.  Bend at your knees. Squat down, but keep your heels off the floor.  Keep your shoulders pulled back, your chin tucked in, and your back straight.  Lift the object slowly, tightening the muscles in your legs, abdomen, and buttocks. Keep the object as close to the  center of your body as possible.  When you put a load down, use these same guidelines in reverse.  Do not:  Lift the object above your waist.  Twist at the waist while lifting or carrying a load. Move your feet if you need to turn, not your waist.  Bend over without bending at your knees.  Avoid reaching over your head, across a table, or for an object on a high surface. OTHER TIPS  Avoid wet floors and keep sidewalks clear of ice to prevent falls.  Do not sleep on a mattress that is too soft or too hard.  Keep items that are used  frequently within easy reach.  Put heavier objects on shelves at waist level and lighter objects on lower or higher shelves.  Find ways to decrease your stress, such as exercise, massage, or relaxation techniques. Stress can build up in your muscles. Tense muscles are more vulnerable to injury.  Seek treatment for depression or anxiety if needed. These conditions can increase your risk of developing back pain. SEEK MEDICAL CARE IF:  You injure your back.  You have questions about diet, exercise, or other ways to prevent back injuries. MAKE SURE YOU:  Understand these instructions.  Will watch your condition.  Will get help right away if you are not doing well or get worse. Document Released: 04/01/2004 Document Revised: 05/17/2011 Document Reviewed: 04/05/2011 [facility] Patient Information 2015 [place], [place]. This information is not intended to replace advice given to you by your health care provider. Make sure you discuss any questions you have with your health care provider. Lumbosacral Strain Lumbosacral strain is a strain of any of the parts that make up your lumbosacral vertebrae. Your lumbosacral vertebrae are the bones that make up the lower third of your backbone. Your lumbosacral vertebrae are held together by muscles and tough, fibrous tissue (ligaments).  CAUSES  A sudden blow to your back can cause lumbosacral strain. Also, anything that causes an excessive stretch of the muscles in the low back can cause this strain. This is typically seen when people exert themselves strenuously, fall, lift heavy objects, bend, or crouch repeatedly. RISK FACTORS  Physically demanding work.  Participation in pushing or pulling sports or sports that require a sudden twist of the back (tennis, golf, baseball).  Weight lifting.  Excessive lower back curvature.  Forward-tilted pelvis.  Weak back or abdominal muscles or both.  Tight hamstrings. SIGNS AND SYMPTOMS  Lumbosacral strain  may cause pain in the area of your injury or pain that moves (radiates) down your leg.  DIAGNOSIS Your health care provider can often diagnose lumbosacral strain through a physical exam. In some cases, you may need tests such as X-ray exams.  TREATMENT  Treatment for your lower back injury depends on many factors that your clinician will have to evaluate. However, most treatment will include the use of anti-inflammatory medicines. HOME CARE INSTRUCTIONS   Avoid hard physical activities (tennis, racquetball, waterskiing) if you are not in proper physical condition for it. This may aggravate or create problems.  If you have a back problem, avoid sports requiring sudden body movements. Swimming and walking are generally safer activities.  Maintain good posture.  Maintain a healthy weight.  For acute conditions, you may put ice on the injured area.  Put ice in a plastic bag.  Place a towel between your skin and the bag.  Leave the ice on for 20 minutes, 2-3 times a day.  When the low back starts  healing, stretching and strengthening exercises may be recommended. SEEK MEDICAL CARE IF:  Your back pain is getting worse.  You experience severe back pain not relieved with medicines. SEEK IMMEDIATE MEDICAL CARE IF:   You have numbness, tingling, weakness, or problems with the use of your arms or legs.  There is a change in bowel or bladder control.  You have increasing pain in any area of the body, including your belly (abdomen).  You notice shortness of breath, dizziness, or feel faint.  You feel sick to your stomach (nauseous), are throwing up (vomiting), or become sweaty.  You notice discoloration of your toes or legs, or your feet get very cold. MAKE SURE YOU:   Understand these instructions.  Will watch your condition.  Will get help right away if you are not doing well or get worse. Document Released: 12/02/2004 Document Revised: 02/27/2013 Document Reviewed:  10/11/2012 [facility] Patient Information 2015 [place], [place]. This information is not intended to replace advice given to you by your health care provider. Make sure you discuss any questions you have with your health care provider.

## 2014-07-31 NOTE — Telephone Encounter (Signed)
Left a message for a return call.  Need to see if the pt has had her annual mammogram.  If so, where and when.  If not, is she interested.

## 2014-07-31 NOTE — Progress Notes (Signed)
Pre visit review using our clinic review tool, if applicable. No additional management support is needed unless otherwise documented below in the visit note. 

## 2014-08-27 ENCOUNTER — Other Ambulatory Visit (INDEPENDENT_AMBULATORY_CARE_PROVIDER_SITE_OTHER): Payer: Managed Care, Other (non HMO)

## 2014-08-27 DIAGNOSIS — Z0000 Encounter for general adult medical examination without abnormal findings: Secondary | ICD-10-CM | POA: Diagnosis not present

## 2014-08-27 LAB — LIPID PANEL
CHOLESTEROL: 232 mg/dL — AB (ref 0–200)
HDL: 54.3 mg/dL (ref >39.00)
LDL Cholesterol: 158 mg/dL — ABNORMAL HIGH (ref 0–99)
NonHDL: 177.7
Total CHOL/HDL Ratio: 4
Triglycerides: 100 mg/dL (ref 0.0–149.0)
VLDL: 20 mg/dL (ref 0.0–40.0)

## 2014-08-27 LAB — CBC WITH DIFFERENTIAL/PLATELET
BASOS PCT: 0.7 % (ref 0.0–3.0)
Basophils Absolute: 0 10*3/uL (ref 0.0–0.1)
EOS PCT: 5.8 % — AB (ref 0.0–5.0)
Eosinophils Absolute: 0.2 10*3/uL (ref 0.0–0.7)
HCT: 43 % (ref 36.0–46.0)
Hemoglobin: 14.4 g/dL (ref 12.0–15.0)
LYMPHS PCT: 25.6 % (ref 12.0–46.0)
Lymphs Abs: 1 10*3/uL (ref 0.7–4.0)
MCHC: 33.6 g/dL (ref 30.0–36.0)
MCV: 89.3 fl (ref 78.0–100.0)
MONO ABS: 0.3 10*3/uL (ref 0.1–1.0)
Monocytes Relative: 7.8 % (ref 3.0–12.0)
NEUTROS PCT: 60.1 % (ref 43.0–77.0)
Neutro Abs: 2.4 10*3/uL (ref 1.4–7.7)
Platelets: 243 10*3/uL (ref 150.0–400.0)
RBC: 4.81 Mil/uL (ref 3.87–5.11)
RDW: 13.4 % (ref 11.5–15.5)
WBC: 4 10*3/uL (ref 4.0–10.5)

## 2014-08-27 LAB — POCT URINALYSIS DIPSTICK
BILIRUBIN UA: NEGATIVE
Blood, UA: NEGATIVE
GLUCOSE UA: NEGATIVE
Ketones, UA: NEGATIVE
LEUKOCYTES UA: NEGATIVE
Nitrite, UA: NEGATIVE
PROTEIN UA: NEGATIVE
Spec Grav, UA: 1.01
Urobilinogen, UA: 0.2
pH, UA: 6.5

## 2014-08-27 LAB — COMPREHENSIVE METABOLIC PANEL
ALBUMIN: 3.9 g/dL (ref 3.5–5.2)
ALT: 17 U/L (ref 0–35)
AST: 17 U/L (ref 0–37)
Alkaline Phosphatase: 79 U/L (ref 39–117)
BUN: 11 mg/dL (ref 6–23)
CALCIUM: 9.6 mg/dL (ref 8.4–10.5)
CHLORIDE: 103 meq/L (ref 96–112)
CO2: 28 meq/L (ref 19–32)
Creatinine, Ser: 0.68 mg/dL (ref 0.40–1.20)
GFR: 92.02 mL/min (ref >60.00)
Glucose, Bld: 92 mg/dL (ref 70–99)
POTASSIUM: 4.1 meq/L (ref 3.5–5.1)
Sodium: 137 mEq/L (ref 135–145)
Total Bilirubin: 0.9 mg/dL (ref 0.2–1.2)
Total Protein: 6.8 g/dL (ref 6.0–8.3)

## 2014-08-27 LAB — TSH: TSH: 1.49 u[IU]/mL (ref 0.35–4.50)

## 2014-09-02 ENCOUNTER — Other Ambulatory Visit: Payer: Self-pay

## 2014-09-03 ENCOUNTER — Ambulatory Visit (INDEPENDENT_AMBULATORY_CARE_PROVIDER_SITE_OTHER): Payer: Managed Care, Other (non HMO) | Admitting: Internal Medicine

## 2014-09-03 ENCOUNTER — Encounter: Payer: Self-pay | Admitting: Internal Medicine

## 2014-09-03 VITALS — BP 140/82 | HR 64 | Temp 98.4°F | Resp 20 | Ht 61.0 in | Wt 128.0 lb

## 2014-09-03 DIAGNOSIS — I10 Essential (primary) hypertension: Secondary | ICD-10-CM

## 2014-09-03 DIAGNOSIS — Z0000 Encounter for general adult medical examination without abnormal findings: Secondary | ICD-10-CM | POA: Diagnosis not present

## 2014-09-03 DIAGNOSIS — I4892 Unspecified atrial flutter: Secondary | ICD-10-CM

## 2014-09-03 DIAGNOSIS — Z23 Encounter for immunization: Secondary | ICD-10-CM | POA: Diagnosis not present

## 2014-09-03 MED ORDER — HYDROCHLOROTHIAZIDE 25 MG PO TABS: 25.0000 mg | ORAL_TABLET | Freq: Every day | ORAL | Status: DC

## 2014-09-03 MED ORDER — MOMETASONE FUROATE 50 MCG/ACT NA SUSP: 2.0000 | Freq: Every day | NASAL | Status: DC | PRN

## 2014-09-03 NOTE — Progress Notes (Signed)
Subjective:    Patient ID: Luna KitchensVickie S Casino, female    DOB: 01/31/1951, 64 y.o.   MRN: [id number]  HPI   Subjective:    Patient ID: Luna KitchensVickie S Loja, female    DOB: 03/07/1951, 64 y.o.   MRN: [id number]  HPI  Subjective:    Patient ID: Luna KitchensVickie S Giuffre, female    DOB: 08/01/1950, 64 y.o.   MRN: [id number]  HPI   64 year-old patient who is seen today for a preventive health examination.  Medical problems include hypertension treated with diuretic therapy as well as allergic rhinitis. She has had a recent gynecologic evaluation  .  She is doing quite well and does exercise regularly.  She has a history chronic bruits. She's had 2 carotid artery ultrasounds that have been  performed with estimated stenoses of 0-39%  Approximately 11 months ago.  She was diagnosed with atrial flutter and she is status post ablation.  Evaluation included a nuclear stress test that was negative.  Doing quite well today  She was seen 1 month ago with some mild left-sided sciatica that has completely resolved  Last colonoscopy 2006  Current Allergies:  No known allergies   Past Medical History:   history of rheumatic fever  Hyperlipidemia  hypertensive suspect  Allergic rhinitis   Family History:   Father died at age 66, cerebrovascular disease  mother died age 66, stomach cancer  Aunt history lung cancer  paternal aunt with colon cancer  Three sisters, one died age 66 complications of congestive heart failure   Social History:   Married  Never Smoked  Risk Factors:  Tobacco use: never     Past Medical History  Diagnosis Date  . ALLERGIC RHINITIS 01/12/2008  . CAROTID BRUITS, BILATERAL 04/01/2009  . Essential hypertension, benign 01/12/2008  . HYPERLIPIDEMIA 05/02/2007  . Rheumatic fever     History   Social History  . Marital Status: Married    Spouse Name: N/A    Number of Children: N/A  . Years of Education: N/A   Occupational History  . Not on file.   Social History Main Topics  .  Smoking status: Never Smoker   . Smokeless tobacco: Never Used  . Alcohol Use: No  . Drug Use: No  . Sexually Active: Not on file   Other Topics Concern  . Not on file   Social History Narrative  . No narrative on file    Past Surgical History  Procedure Date  . Salpingectomy     Family History  Problem Relation Age of Onset  . Cancer Mother     stomach  . Stroke Father   . Cancer Paternal Aunt     colon    No Known Allergies  Current Outpatient Prescriptions on File Prior to Visit  Medication Sig Dispense Refill  . EPINEPHrine (EPIPEN) 0.3 mg/0.3 mL DEVI Inject 0.3 mg into the muscle once.        . fexofenadine (ALLEGRA) 180 MG tablet Take 180 mg by mouth daily.        . finasteride (PROSCAR) 5 MG tablet Take 1 tablet (5 mg total) by mouth daily. 1/2 once daily  90 tablet  6  . fluticasone (FLONASE) 50 MCG/ACT nasal spray 2 sprays by Nasal route daily.  16 g  6  . hydrochlorothiazide (HYDRODIURIL) 12.5 MG tablet Take 1 tablet (12.5 mg total) by mouth daily.  90 tablet  6    BP 110/72  Pulse 66  Temp(Src) 98  F (36.7 C) (Oral)  Resp 16  Ht  (1.549 m)  Wt 124 lb (56.246 kg)  BMI 23.43 kg/m2  SpO2 99%   Wt Readings from Last 3 Encounters:  07/30/11 124 lb (56.246 kg)  05/06/10 126 lb (57.153 kg)  01/09/10 128 lb (58.06 kg)    Review of Systems  Constitutional: Negative for fever, appetite change, fatigue and unexpected weight change.  HENT: Positive for congestion and rhinorrhea. Negative for hearing loss, ear pain, nosebleeds, sore throat, mouth sores, trouble swallowing, neck stiffness, dental problem, voice change, sinus pressure and tinnitus.   Eyes: Negative for photophobia, pain, redness and visual disturbance.  Respiratory: Negative for cough, chest tightness and shortness of breath.   Cardiovascular: Negative for chest pain, palpitations and leg swelling.  Gastrointestinal: Negative for nausea, vomiting, abdominal pain, diarrhea, constipation,  blood in stool, abdominal distention and rectal pain.  Genitourinary: Negative for dysuria, urgency, frequency, hematuria, flank pain, vaginal bleeding, vaginal discharge, difficulty urinating, genital sores, vaginal pain, menstrual problem and pelvic pain.  Musculoskeletal: Negative for back pain and arthralgias.  Skin: Negative for rash.  Neurological: Negative for dizziness, syncope, speech difficulty, weakness, light-headedness, numbness and headaches.  Hematological: Negative for adenopathy. Does not bruise/bleed easily.  Psychiatric/Behavioral: Negative for suicidal ideas, behavioral problems, self-injury, dysphoric mood and agitation. The patient is not nervous/anxious.        Objective:   Physical Exam  Constitutional: She is oriented to person, place, and time. She appears well-developed and well-nourished.  HENT:  Head: Normocephalic and atraumatic.  Right Ear: External ear normal.  Left Ear: External ear normal.  Mouth/Throat: Oropharynx is clear and moist.  Eyes: Conjunctivae and EOM are normal.  Neck: Normal range of motion. Neck supple. No JVD present. No thyromegaly present.   Cardiovascular: Normal rate, regular rhythm, normal heart sounds and intact distal pulses.   No murmur heard.      Diminished right dorsalis pedis pulse  Pulmonary/Chest: Effort normal and breath sounds normal. She has no wheezes. She has no rales.  Abdominal: Soft. Bowel sounds are normal. She exhibits no distension and no mass. There is no tenderness. There is no rebound and no guarding.  Musculoskeletal: Normal range of motion. She exhibits no edema and no tenderness.  Neurological: She is alert and oriented to person, place, and time. She has normal reflexes. No cranial nerve deficit. She exhibits normal muscle tone. Coordination normal.  Skin: Skin is warm and dry. No rash noted.  Psychiatric: She has a normal mood and affect. Her behavior is normal.          Assessment & Plan:    Preventive health examination Essential hypertension. Well controlled Allergic rhinitis stable H/o Bilateral carotid bruits (carotid duplex February 2 011)- no audible bruits today. Will remove from problem list     Review of Systems  Asym; recent gyn exam     Objective:   Physical Exam  Neck:  Faint bilateral carotid bruits    See above      Assessment & Plan:    Prev Health Status post ablation for atrial flutter Hypertension well controlled Mild dyslipidemia.  Continue statin therapy  Review of Systems    as above Objective:   Physical Exam  As above      Assessment & Plan:  Preventive health exam Needs 10 year colonoscopy We'll check a bone density  Continue low-salt diet and home blood pressure monitoring

## 2014-09-03 NOTE — Progress Notes (Signed)
Pre visit review using our clinic review tool, if applicable. No additional management support is needed unless otherwise documented below in the visit note. 

## 2014-09-03 NOTE — Patient Instructions (Signed)
Limit your sodium (Salt) intake    It is important that you exercise regularly, at least 20 minutes 3 to 4 times per week.  If you develop chest pain or shortness of breath seek  medical attention.  Please check your blood pressure on a regular basis.  If it is consistently greater than 150/90, please make an office appointment.  Take a calcium supplement, plus [phone] units of vitamin D  Health Maintenance Adopting a healthy lifestyle and getting preventive care can go a long way to promote health and wellness. Talk with your health care provider about what schedule of regular examinations is right for you. This is a good chance for you to check in with your provider about disease prevention and staying healthy. In between checkups, there are plenty of things you can do on your own. Experts have done a lot of research about which lifestyle changes and preventive measures are most likely to keep you healthy. Ask your health care provider for more information. WEIGHT AND DIET  Eat a healthy diet  Be sure to include plenty of vegetables, fruits, low-fat dairy products, and lean protein.  Do not eat a lot of foods high in solid fats, added sugars, or salt.  Get regular exercise. This is one of the most important things you can do for your health.  Most adults should exercise for at least 150 minutes each week. The exercise should increase your heart rate and make you sweat (moderate-intensity exercise).  Most adults should also do strengthening exercises at least twice a week. This is in addition to the moderate-intensity exercise.  Maintain a healthy weight  Body mass index (BMI) is a measurement that can be used to identify possible weight problems. It estimates body fat based on height and weight. Your health care provider can help determine your BMI and help you achieve or maintain a healthy weight.  For females 64 years of age and older:   A BMI below 18.5 is considered  underweight.  A BMI of 18.5 to 24.9 is normal.  A BMI of 25 to 29.9 is considered overweight.  A BMI of 30 and above is considered obese.  Watch levels of cholesterol and blood lipids  You should start having your blood tested for lipids and cholesterol at 66 years of age, then have this test every 5 years.  You may need to have your cholesterol levels checked more often if:  Your lipid or cholesterol levels are high.  You are older than 65 years of age.  You are at high risk for heart disease.  CANCER SCREENING   Lung Cancer  Lung cancer screening is recommended for adults 40-59 years old who are at high risk for lung cancer because of a history of smoking.  A yearly low-dose CT scan of the lungs is recommended for people who:  Currently smoke.  Have quit within the past 15 years.  Have at least a 30-pack-year history of smoking. A pack year is smoking an average of one pack of cigarettes a day for 1 year.  Yearly screening should continue until it has been 15 years since you quit.  Yearly screening should stop if you develop a health problem that would prevent you from having lung cancer treatment.  Breast Cancer  Practice breast self-awareness. This means understanding how your breasts normally appear and feel.  It also means doing regular breast self-exams. Let your health care provider know about any changes, no matter how small.  If  you are in your 20s or 30s, you should have a clinical breast exam (CBE) by a health care provider every 1-3 years as part of a regular health exam.  If you are 55 or older, have a CBE every year. Also consider having a breast X-ray (mammogram) every year.  If you have a family history of breast cancer, talk to your health care provider about genetic screening.  If you are at high risk for breast cancer, talk to your health care provider about having an MRI and a mammogram every year.  Breast cancer gene (BRCA) assessment is  recommended for women who have family members with BRCA-related cancers. BRCA-related cancers include:  Breast.  Ovarian.  Tubal.  Peritoneal cancers.  Results of the assessment will determine the need for genetic counseling and BRCA1 and BRCA2 testing. Cervical Cancer Routine pelvic examinations to screen for cervical cancer are no longer recommended for nonpregnant women who are considered low risk for cancer of the pelvic organs (ovaries, uterus, and vagina) and who do not have symptoms. A pelvic examination may be necessary if you have symptoms including those associated with pelvic infections. Ask your health care provider if a screening pelvic exam is right for you.   The Pap test is the screening test for cervical cancer for women who are considered at risk.  If you had a hysterectomy for a problem that was not cancer or a condition that could lead to cancer, then you no longer need Pap tests.  If you are older than 65 years, and you have had normal Pap tests for the past 10 years, you no longer need to have Pap tests.  If you have had past treatment for cervical cancer or a condition that could lead to cancer, you need Pap tests and screening for cancer for at least 20 years after your treatment.  If you no longer get a Pap test, assess your risk factors if they change (such as having a new sexual partner). This can affect whether you should start being screened again.  Some women have medical problems that increase their chance of getting cervical cancer. If this is the case for you, your health care provider may recommend more frequent screening and Pap tests.  The human papillomavirus (HPV) test is another test that may be used for cervical cancer screening. The HPV test looks for the virus that can cause cell changes in the cervix. The cells collected during the Pap test can be tested for HPV.  The HPV test can be used to screen women 74 years of age and older. Getting tested  for HPV can extend the interval between normal Pap tests from three to five years.  An HPV test also should be used to screen women of any age who have unclear Pap test results.  After 66 years of age, women should have HPV testing as often as Pap tests.  Colorectal Cancer  This type of cancer can be detected and often prevented.  Routine colorectal cancer screening usually begins at 66 years of age and continues through 66 years of age.  Your health care provider may recommend screening at an earlier age if you have risk factors for colon cancer.  Your health care provider may also recommend using home test kits to check for hidden blood in the stool.  A small camera at the end of a tube can be used to examine your colon directly (sigmoidoscopy or colonoscopy). This is done to check for the  earliest forms of colorectal cancer.  Routine screening usually begins at age 45.  Direct examination of the colon should be repeated every 5-10 years through 66 years of age. However, you may need to be screened more often if early forms of precancerous polyps or small growths are found. Skin Cancer  Check your skin from head to toe regularly.  Tell your health care provider about any new moles or changes in moles, especially if there is a change in a mole's shape or color.  Also tell your health care provider if you have a mole that is larger than the size of a pencil eraser.  Always use sunscreen. Apply sunscreen liberally and repeatedly throughout the day.  Protect yourself by wearing long sleeves, pants, a wide-brimmed hat, and sunglasses whenever you are outside. HEART DISEASE, DIABETES, AND HIGH BLOOD PRESSURE   Have your blood pressure checked at least every 1-2 years. High blood pressure causes heart disease and increases the risk of stroke.  If you are between 29 years and 56 years old, ask your health care provider if you should take aspirin to prevent strokes.  Have regular  diabetes screenings. This involves taking a blood sample to check your fasting blood sugar level.  If you are at a normal weight and have a low risk for diabetes, have this test once every three years after 66 years of age.  If you are overweight and have a high risk for diabetes, consider being tested at a younger age or more often. PREVENTING INFECTION  Hepatitis B  If you have a higher risk for hepatitis B, you should be screened for this virus. You are considered at high risk for hepatitis B if:  You were born in a country where hepatitis B is common. Ask your health care provider which countries are considered high risk.  Your parents were born in a high-risk country, and you have not been immunized against hepatitis B (hepatitis B vaccine).  You have HIV or AIDS.  You use needles to inject street drugs.  You live with someone who has hepatitis B.  You have had sex with someone who has hepatitis B.  You get hemodialysis treatment.  You take certain medicines for conditions, including cancer, organ transplantation, and autoimmune conditions. Hepatitis C  Blood testing is recommended for:  Everyone born from [birth year] through 1967.  Anyone with known risk factors for hepatitis C. Sexually transmitted infections (STIs)  You should be screened for sexually transmitted infections (STIs) including gonorrhea and chlamydia if:  You are sexually active and are younger than 66 years of age.  You are older than 66 years of age and your health care provider tells you that you are at risk for this type of infection.  Your sexual activity has changed since you were last screened and you are at an increased risk for chlamydia or gonorrhea. Ask your health care provider if you are at risk.  If you do not have HIV, but are at risk, it may be recommended that you take a prescription medicine daily to prevent HIV infection. This is called pre-exposure prophylaxis (PrEP). You are considered at  risk if:  You are sexually active and do not regularly use condoms or know the HIV status of your partner(s).  You take drugs by injection.  You are sexually active with a partner who has HIV. Talk with your health care provider about whether you are at high risk of being infected with HIV. If you choose  to begin PrEP, you should first be tested for HIV. You should then be tested every 3 months for as long as you are taking PrEP.  PREGNANCY   If you are premenopausal and you may become pregnant, ask your health care provider about preconception counseling.  If you may become pregnant, take 400 to 800 micrograms (mcg) of folic acid every day.  If you want to prevent pregnancy, talk to your health care provider about birth control (contraception). OSTEOPOROSIS AND MENOPAUSE   Osteoporosis is a disease in which the bones lose minerals and strength with aging. This can result in serious bone fractures. Your risk for osteoporosis can be identified using a bone density scan.  If you are 26 years of age or older, or if you are at risk for osteoporosis and fractures, ask your health care provider if you should be screened.  Ask your health care provider whether you should take a calcium or vitamin D supplement to lower your risk for osteoporosis.  Menopause may have certain physical symptoms and risks.  Hormone replacement therapy may reduce some of these symptoms and risks. Talk to your health care provider about whether hormone replacement therapy is right for you.  HOME CARE INSTRUCTIONS   Schedule regular health, dental, and eye exams.  Stay current with your immunizations.   Do not use any tobacco products including cigarettes, chewing tobacco, or electronic cigarettes.  If you are pregnant, do not drink alcohol.  If you are breastfeeding, limit how much and how often you drink alcohol.  Limit alcohol intake to no more than 1 drink per day for nonpregnant women. One drink equals  12 ounces of beer, 5 ounces of wine, or 1 ounces of hard liquor.  Do not use street drugs.  Do not share needles.  Ask your health care provider for help if you need support or information about quitting drugs.  Tell your health care provider if you often feel depressed.  Tell your health care provider if you have ever been abused or do not feel safe at home. Document Released: 09/07/2010 Document Revised: 07/09/2013 Document Reviewed: 01/24/2013 [facility] Patient Information 2015 [place], [place]. This information is not intended to replace advice given to you by your health care provider. Make sure you discuss any questions you have with your health care provider.

## 2014-09-16 ENCOUNTER — Encounter: Payer: Self-pay | Admitting: Gastroenterology

## 2014-09-17 ENCOUNTER — Telehealth: Payer: Self-pay | Admitting: Internal Medicine

## 2014-09-17 NOTE — Telephone Encounter (Signed)
Pt said she need a copy of her summary mail to her house.

## 2014-09-18 NOTE — Telephone Encounter (Signed)
Left message on voicemail, requested information put in mail today.

## 2014-10-01 ENCOUNTER — Ambulatory Visit (INDEPENDENT_AMBULATORY_CARE_PROVIDER_SITE_OTHER)
Admission: RE | Admit: 2014-10-01 | Discharge: 2014-10-01 | Disposition: A | Discharge status: Home / Self Care | Payer: Managed Care, Other (non HMO) | Source: Ambulatory Visit | Attending: Internal Medicine | Admitting: Internal Medicine

## 2014-10-01 DIAGNOSIS — Z13820 Encounter for screening for osteoporosis: Secondary | ICD-10-CM

## 2014-10-01 DIAGNOSIS — Z0000 Encounter for general adult medical examination without abnormal findings: Secondary | ICD-10-CM

## 2014-11-14 ENCOUNTER — Ambulatory Visit (AMBULATORY_SURGERY_CENTER): Payer: Self-pay | Admitting: *Deleted

## 2014-11-14 VITALS — Ht 62.0 in | Wt 129.2 lb

## 2014-11-14 DIAGNOSIS — Z1211 Encounter for screening for malignant neoplasm of colon: Secondary | ICD-10-CM

## 2014-11-14 MED ORDER — NA SULFATE-K SULFATE-MG SULF 17.5-3.13-1.6 GM/177ML PO SOLN: ORAL | Status: DC

## 2014-11-14 NOTE — Progress Notes (Signed)
No allergies to eggs or soy. No problems with anesthesia.  Pt given Emmi instructions for colonoscopy  No oxygen use  No diet drug use  

## 2014-11-28 ENCOUNTER — Ambulatory Visit (AMBULATORY_SURGERY_CENTER): Payer: Managed Care, Other (non HMO) | Admitting: Gastroenterology

## 2014-11-28 ENCOUNTER — Encounter: Payer: Self-pay | Admitting: Gastroenterology

## 2014-11-28 VITALS — BP 125/63 | HR 57 | Temp 98.1°F | Resp 18 | Ht 62.0 in | Wt 129.0 lb

## 2014-11-28 DIAGNOSIS — K5520 Angiodysplasia of colon without hemorrhage: Secondary | ICD-10-CM

## 2014-11-28 DIAGNOSIS — Z1211 Encounter for screening for malignant neoplasm of colon: Secondary | ICD-10-CM | POA: Diagnosis not present

## 2014-11-28 DIAGNOSIS — K648 Other hemorrhoids: Secondary | ICD-10-CM

## 2014-11-28 DIAGNOSIS — K5730 Diverticulosis of large intestine without perforation or abscess without bleeding: Secondary | ICD-10-CM

## 2014-11-28 MED ORDER — SODIUM CHLORIDE 0.9 % IV SOLN: 500.0000 mL | INTRAVENOUS | Status: DC

## 2014-11-28 NOTE — Progress Notes (Signed)
Transferred to recovery room. A/O x3, pleased with MAC.  VSS.  Report to Jill, RN. 

## 2014-11-28 NOTE — Op Note (Signed)
[facility] [address] [place], [address]   COLONOSCOPY PROCEDURE REPORT  PATIENT: Chelsea, Morgan  MR#: [id number] BIRTHDATE: [birth year] , 64  yrs. old GENDER: female ENDOSCOPIST: Louis Meckel, MD REFERRED [id number] Amador Cunas, M.D. PROCEDURE DATE:  11/28/2014 PROCEDURE:   Colonoscopy, screening First Screening Colonoscopy - Avg.  risk and is 50 yrs.  old or older - No.  Prior Negative Screening - Now for repeat screening. 10 or more years since last screening  History of Adenoma - Now for follow-up colonoscopy & has been > or = to 3 yrs.  N/A  Polyps removed today? No Recommend repeat exam, <10 yrs? No ASA CLASS:   Class II INDICATIONS:Colorectal Neoplasm Risk Assessment for this procedure is average risk. MEDICATIONS: Monitored anesthesia care and Propofol 150 mg IV  DESCRIPTION OF PROCEDURE:   After the risks benefits and alternatives of the procedure were thoroughly explained, informed consent was obtained.  The digital rectal exam revealed no abnormalities of the rectum.   The [id number]  endoscope was introduced through the anus and advanced to the cecum, which was identified by both the appendix and ileocecal valve. No adverse events experienced.   The quality of the prep was (Suprep was used) excellent.  The instrument was then slowly withdrawn as the colon was fully examined. Estimated blood loss is zero unless otherwise noted in this procedure report.      COLON FINDINGS: Small angiodysplastic lesion was found at the cecum. There was mild diverticulosis noted.   Internal hemorrhoids were found.  Retroflexed views revealed no abnormalities. The time to cecum = 7.6 Withdrawal time = 6.6   The scope was withdrawn and the procedure completed. COMPLICATIONS: There were no immediate complications.  ENDOSCOPIC IMPRESSION: 1.   Small angiodysplastic lesion at the cecum 2.   Mild diverticulosis was noted 3.   Internal  hemorrhoids  RECOMMENDATIONS: Continue current colorectal screening recommendations for "routine risk" patients with a repeat colonoscopy in 10 years.  eSigned:  Louis Meckel, MD 11/28/2014 8:54 AM   cc:

## 2014-11-28 NOTE — Patient Instructions (Signed)
YOU HAD AN ENDOSCOPIC PROCEDURE TODAY AT THE [facility]:   Refer to the procedure report that was given to you for any specific questions about what was found during the examination.  If the procedure report does not answer your questions, please call your gastroenterologist to clarify.  If you requested that your care partner not be given the details of your procedure findings, then the procedure report has been included in a sealed envelope for you to review at your convenience later.  YOU SHOULD EXPECT: Some feelings of bloating in the abdomen. Passage of more gas than usual.  Walking can help get rid of the air that was put into your GI tract during the procedure and reduce the bloating. If you had a lower endoscopy (such as a colonoscopy or flexible sigmoidoscopy) you may notice spotting of blood in your stool or on the toilet paper. If you underwent a bowel prep for your procedure, you may not have a normal bowel movement for a few days.  Please Note:  You might notice some irritation and congestion in your nose or some drainage.  This is from the oxygen used during your procedure.  There is no need for concern and it should clear up in a day or so.  SYMPTOMS TO REPORT IMMEDIATELY:   Following lower endoscopy (colonoscopy or flexible sigmoidoscopy):  Excessive amounts of blood in the stool  Significant tenderness or worsening of abdominal pains  Swelling of the abdomen that is new, acute  Fever of 100F or higher   For urgent or emergent issues, a gastroenterologist can be reached at any hour by calling [phone].   DIET: Your first meal following the procedure should be a small meal and then it is ok to progress to your normal diet. Heavy or fried foods are harder to digest and may make you feel nauseous or bloated.  Likewise, meals heavy in dairy and vegetables can increase bloating.  Drink plenty of fluids but you should avoid alcoholic beverages for 24  hours.  ACTIVITY:  You should plan to take it easy for the rest of today and you should NOT DRIVE or use heavy machinery until tomorrow (because of the sedation medicines used during the test).    FOLLOW UP: Our staff will call the number listed on your records the next business day following your procedure to check on you and address any questions or concerns that you may have regarding the information given to you following your procedure. If we do not reach you, we will leave a message.  However, if you are feeling well and you are not experiencing any problems, there is no need to return our call.  We will assume that you have returned to your regular daily activities without incident.  If any biopsies were taken you will be contacted by phone or by letter within the next 1-3 weeks.  Please call us at [phone] if you have not heard about the biopsies in 3 weeks.    SIGNATURES/CONFIDENTIALITY: You and/or your care partner have signed paperwork which will be entered into your electronic medical record.  These signatures attest to the fact that that the information above on your After Visit Summary has been reviewed and is understood.  Full responsibility of the confidentiality of this discharge information lies with you and/or your care-partner. 

## 2014-11-29 ENCOUNTER — Telehealth: Payer: Self-pay | Admitting: Emergency Medicine

## 2014-11-29 NOTE — Telephone Encounter (Signed)
  Follow up Call-  Call back number 11/28/2014  Post procedure Call Back phone  # #[phone] hm  Permission to leave phone message Yes     Patient questions:  Do you have a fever, pain , or abdominal swelling? No. Pain Score  0 *  Have you tolerated food without any problems? Yes.    Have you been able to return to your normal activities? Yes.    Do you have any questions about your discharge instructions: Diet   No. Medications  No. Follow up visit  No.  Do you have questions or concerns about your Care? No.  Actions: * If pain score is 4 or above: No action needed, pain <4.

## 2014-12-27 ENCOUNTER — Ambulatory Visit (INDEPENDENT_AMBULATORY_CARE_PROVIDER_SITE_OTHER): Payer: Managed Care, Other (non HMO) | Admitting: Internal Medicine

## 2014-12-27 ENCOUNTER — Encounter: Payer: Self-pay | Admitting: Internal Medicine

## 2014-12-27 VITALS — BP 138/78 | HR 63 | Temp 98.4°F | Resp 20 | Ht 62.0 in | Wt 129.0 lb

## 2014-12-27 DIAGNOSIS — M25562 Pain in left knee: Secondary | ICD-10-CM | POA: Diagnosis not present

## 2014-12-27 NOTE — Patient Instructions (Signed)
Report any persistent new or worsening symptoms

## 2014-12-27 NOTE — Progress Notes (Signed)
Subjective:    Patient ID: Chelsea Morgan, female    DOB: [birth year], 64 y.o.   MRN: [id number]  HPI  64 year old patient who presents with a 3-4 week history of left medial knee pain.  She states this initially started after doing yoga exercises.  She has been taking Aleve 1 tablet daily and over the past couple days has improved.  There is been no swelling.  She states pain is maximal when she stands after prolonged sitting.  There is minimal discomfort with walking  Past Medical History  Diagnosis Date  . ALLERGIC RHINITIS 01/12/2008  . CAROTID BRUITS, BILATERAL 04/01/2009  . Essential hypertension, benign 01/12/2008  . HYPERLIPIDEMIA 05/02/2007  . Rheumatic fever   . Atrial flutter (HCC)   . Blood transfusion without reported diagnosis 1981    after ectopic pregnancy    Social History   Social History  . Marital Status: Married    Spouse Name: N/A  . Number of Children: N/A  . Years of Education: N/A   Occupational History  . Not on file.   Social History Main Topics  . Smoking status: Never Smoker   . Smokeless tobacco: Never Used  . Alcohol Use: No  . Drug Use: Yes     Comment: rare  . Sexual Activity: Not on file   Other Topics Concern  . Not on file   Social History Narrative    Past Surgical History  Procedure Laterality Date  . Salpingectomy  1981  . Ablation  12-28-13    atrial flutter ablation by Dr Ladona Ridgel  . Atrial flutter ablation N/A 12/28/2013    Procedure: ATRIAL FLUTTER ABLATION;  Surgeon: Marinus Maw, MD;  Location: [facility] CATH LAB;  Service: Cardiovascular;  Laterality: N/A;  . Carpal tunnel release Right 2007    Family History  Problem Relation Age of Onset  . Cancer Mother     stomach  . Esophageal cancer Mother 2    per pt esophageal ca at [facility]  . Stroke Father   . Cancer Paternal Aunt     colon  . Colon cancer Paternal Aunt 44  . Rectal cancer Neg Hx   . Stomach cancer Neg Hx     No Known Allergies  Current Outpatient  Prescriptions on File Prior to Visit  Medication Sig Dispense Refill  . aspirin 81 MG tablet Take 81 mg by mouth daily.    Marland Kitchen b complex vitamins tablet Take 1 tablet by mouth daily.    Marland Kitchen BIOTIN 5000 PO Take 1 tablet by mouth daily.     . carvedilol (COREG) 12.5 MG tablet Take 0.5 tablets (6.25 mg total) by mouth 2 (two) times daily. 180 tablet 3  . Cholecalciferol (VITAMIN D) 2000 UNITS CAPS Take 4,000-6,000 Units by mouth daily.     Marland Kitchen EPINEPHrine (EPIPEN) 0.3 mg/0.3 mL DEVI Inject 0.3 mg into the muscle once.      . finasteride (PROSCAR) 5 MG tablet Take 0.5 tablets (2.5 mg total) by mouth daily. 90 tablet 3  . hydrochlorothiazide (HYDRODIURIL) 25 MG tablet Take 1 tablet (25 mg total) by mouth daily. 90 tablet 3  . Magnesium 250 MG TABS Take 1 tablet by mouth 2 (two) times daily.    . mometasone (NASONEX) 50 MCG/ACT nasal spray Place 2 sprays into the nose daily as needed (congestion). 17 g 2  . Multiple Vitamin (MULTIVITAMIN) tablet Take 1 tablet by mouth daily.    . Omega-3 Fatty Acids (FISH OIL) 1360  MG CAPS Take by mouth daily.    Marland Kitchen. POTASSIUM PO Take 1 capsule by mouth daily. OTC     No current facility-administered medications on file prior to visit.    BP 138/78 mmHg  Pulse 63  Temp(Src) 98.4 F (36.9 C) (Oral)  Resp 20  Ht 5\' 2"  (1.575 m)  Wt 129 lb (58.514 kg)  BMI 23.59 kg/m2  SpO2 98%     Review of Systems  Constitutional: Negative.   HENT: Negative for congestion, dental problem, hearing loss, rhinorrhea, sinus pressure, sore throat and tinnitus.   Eyes: Negative for pain, discharge and visual disturbance.  Respiratory: Negative for cough and shortness of breath.   Cardiovascular: Negative for chest pain, palpitations and leg swelling.  Gastrointestinal: Negative for nausea, vomiting, abdominal pain, diarrhea, constipation, blood in stool and abdominal distention.  Genitourinary: Negative for dysuria, urgency, frequency, hematuria, flank pain, vaginal bleeding,  vaginal discharge, difficulty urinating, vaginal pain and pelvic pain.  Musculoskeletal: Positive for arthralgias and gait problem. Negative for joint swelling.  Skin: Negative for rash.  Neurological: Negative for dizziness, syncope, speech difficulty, weakness, numbness and headaches.  Hematological: Negative for adenopathy.  Psychiatric/Behavioral: Negative for behavioral problems, dysphoric mood and agitation. The patient is not nervous/anxious.        Objective:   Physical Exam  Constitutional: She appears well-developed and well-nourished. No distress.  Musculoskeletal:  Left knee examined Full flexion and extension No effusion Suggestion of increased warmth involving the medial aspect of the knee.  No significant tenderness          Assessment & Plan:   Left medial knee pain.  Possible medial meniscal injury versus medial collateral ligament strain.  She is improving.  We'll continue to moderate her activities and increase Aleve to twice daily.  She report any clinical worsening

## 2014-12-27 NOTE — Progress Notes (Signed)
Pre visit review using our clinic review tool, if applicable. No additional management support is needed unless otherwise documented below in the visit note. 

## 2015-08-15 ENCOUNTER — Telehealth: Payer: Self-pay | Admitting: Internal Medicine

## 2015-08-15 NOTE — Telephone Encounter (Signed)
Yes, that is fine. 

## 2015-08-15 NOTE — Telephone Encounter (Signed)
lmovm to call and reschedule physical for sometime in July

## 2015-08-15 NOTE — Telephone Encounter (Signed)
Pt call to say that she did not want to wait until Sept to have her physical. She is not due until after 09/03/15 can we create something for her in July

## 2015-08-22 ENCOUNTER — Encounter: Payer: Managed Care, Other (non HMO) | Admitting: Internal Medicine

## 2015-09-10 ENCOUNTER — Other Ambulatory Visit (INDEPENDENT_AMBULATORY_CARE_PROVIDER_SITE_OTHER): Payer: Managed Care, Other (non HMO)

## 2015-09-10 DIAGNOSIS — Z0000 Encounter for general adult medical examination without abnormal findings: Secondary | ICD-10-CM | POA: Diagnosis not present

## 2015-09-10 LAB — BASIC METABOLIC PANEL
BUN: 11 mg/dL (ref 6–23)
CALCIUM: 9.7 mg/dL (ref 8.4–10.5)
CO2: 30 mEq/L (ref 19–32)
CREATININE: 0.71 mg/dL (ref 0.40–1.20)
Chloride: 104 mEq/L (ref 96–112)
GFR: 87.27 mL/min (ref >60.00)
Glucose, Bld: 104 mg/dL — ABNORMAL HIGH (ref 70–99)
Potassium: 3.9 mEq/L (ref 3.5–5.1)
SODIUM: 142 meq/L (ref 135–145)

## 2015-09-10 LAB — POC URINALSYSI DIPSTICK (AUTOMATED)
Bilirubin, UA: NEGATIVE
Blood, UA: NEGATIVE
Glucose, UA: NEGATIVE
KETONES UA: NEGATIVE
Leukocytes, UA: NEGATIVE
Nitrite, UA: NEGATIVE
PH UA: 7
PROTEIN UA: NEGATIVE
SPEC GRAV UA: 1.015
UROBILINOGEN UA: 0.2

## 2015-09-10 LAB — HEPATIC FUNCTION PANEL
ALT: 17 U/L (ref 0–35)
AST: 17 U/L (ref 0–37)
Albumin: 4.1 g/dL (ref 3.5–5.2)
Alkaline Phosphatase: 80 U/L (ref 39–117)
Bilirubin, Direct: 0.1 mg/dL (ref 0.0–0.3)
Total Bilirubin: 0.9 mg/dL (ref 0.2–1.2)
Total Protein: 6.6 g/dL (ref 6.0–8.3)

## 2015-09-10 LAB — CBC WITH DIFFERENTIAL/PLATELET
Basophils Absolute: 0 K/uL (ref 0.0–0.1)
Basophils Relative: 0.9 % (ref 0.0–3.0)
Eosinophils Absolute: 0.1 K/uL (ref 0.0–0.7)
Eosinophils Relative: 3.7 % (ref 0.0–5.0)
HCT: 41.5 % (ref 36.0–46.0)
Hemoglobin: 13.9 g/dL (ref 12.0–15.0)
Lymphocytes Relative: 30.7 % (ref 12.0–46.0)
Lymphs Abs: 1.2 K/uL (ref 0.7–4.0)
MCHC: 33.4 g/dL (ref 30.0–36.0)
MCV: 88.2 fl (ref 78.0–100.0)
Monocytes Absolute: 0.3 K/uL (ref 0.1–1.0)
Monocytes Relative: 8.3 % (ref 3.0–12.0)
Neutro Abs: 2.2 K/uL (ref 1.4–7.7)
Neutrophils Relative %: 56.4 % (ref 43.0–77.0)
Platelets: 276 K/uL (ref 150.0–400.0)
RBC: 4.7 Mil/uL (ref 3.87–5.11)
RDW: 13.7 % (ref 11.5–15.5)
WBC: 3.9 K/uL — ABNORMAL LOW (ref 4.0–10.5)

## 2015-09-10 LAB — LIPID PANEL
Cholesterol: 217 mg/dL — ABNORMAL HIGH (ref 0–200)
HDL: 47.2 mg/dL
LDL Cholesterol: 148 mg/dL — ABNORMAL HIGH (ref 0–99)
NonHDL: 169.31
Total CHOL/HDL Ratio: 5
Triglycerides: 107 mg/dL (ref 0.0–149.0)
VLDL: 21.4 mg/dL (ref 0.0–40.0)

## 2015-09-10 LAB — TSH: TSH: 1.99 u[IU]/mL (ref 0.35–4.50)

## 2015-09-15 ENCOUNTER — Encounter: Payer: Self-pay | Admitting: Internal Medicine

## 2015-09-15 ENCOUNTER — Encounter: Payer: Managed Care, Other (non HMO) | Admitting: Internal Medicine

## 2015-09-15 ENCOUNTER — Ambulatory Visit (INDEPENDENT_AMBULATORY_CARE_PROVIDER_SITE_OTHER): Payer: Managed Care, Other (non HMO) | Admitting: Internal Medicine

## 2015-09-15 VITALS — BP 120/80 | HR 69 | Temp 98.1°F | Resp 18 | Ht 60.25 in | Wt 130.0 lb

## 2015-09-15 DIAGNOSIS — J3089 Other allergic rhinitis: Secondary | ICD-10-CM | POA: Diagnosis not present

## 2015-09-15 DIAGNOSIS — Z0000 Encounter for general adult medical examination without abnormal findings: Secondary | ICD-10-CM

## 2015-09-15 DIAGNOSIS — I10 Essential (primary) hypertension: Secondary | ICD-10-CM

## 2015-09-15 DIAGNOSIS — I483 Typical atrial flutter: Secondary | ICD-10-CM | POA: Diagnosis not present

## 2015-09-15 DIAGNOSIS — Z23 Encounter for immunization: Secondary | ICD-10-CM

## 2015-09-15 NOTE — Progress Notes (Signed)
Subjective:    Patient ID: Chelsea Morgan, female    DOB: [birth year], 65 y.o.   MRN: [id number]  HPI    Subjective:    Patient ID: Chelsea Morgan, female    DOB: 02-07-1951, 65 y.o.   MRN: [id number]  HPI  Subjective:    Patient ID: Chelsea Morgan, female    DOB: 1951-02-21, 65 y.o.   MRN: [id number]  HPI   65 year-old patient who is seen today for a preventive health examination.  Medical problems include hypertension treated with diuretic therapy as well as allergic rhinitis. She has had a recent gynecologic evaluation  .  She is doing quite well and does exercise regularly.  She has a history chronic bruits. She's had 2 carotid artery ultrasounds that have been  performed with estimated stenoses of 0-39%  Approximately 2 years ago.  She was diagnosed with atrial flutter and she is status post ablation.  Evaluation included a nuclear stress test that was negative.  Doing quite well today    Last colonoscopy 2006, September 2016  Current Allergies:  No known allergies   Past Medical History:   history of rheumatic fever  Hyperlipidemia  hypertensive suspect  Allergic rhinitis   Family History:   Father died at age 81, cerebrovascular disease  mother died age 69, stomach cancer  Aunt history lung cancer  paternal aunt with colon cancer  Three sisters, one died age 51 complications of congestive heart failure   Social History:   Married  Never Smoked  Risk Factors:  Tobacco use: never     Past Medical History  Diagnosis Date  . ALLERGIC RHINITIS 01/12/2008  . CAROTID BRUITS, BILATERAL 04/01/2009  . Essential hypertension, benign 01/12/2008  . HYPERLIPIDEMIA 05/02/2007  . Rheumatic fever     History   Social History  . Marital Status: Married    Spouse Name: N/A    Number of Children: N/A  . Years of Education: N/A   Occupational History  . Not on file.   Social History Main Topics  . Smoking status: Never Smoker   . Smokeless tobacco: Never Used  .  Alcohol Use: No  . Drug Use: No  . Sexually Active: Not on file   Other Topics Concern  . Not on file   Social History Narrative  . No narrative on file    Past Surgical History  Procedure Date  . Salpingectomy     Family History  Problem Relation Age of Onset  . Cancer Mother     stomach  . Stroke Father   . Cancer Paternal Aunt     colon    No Known Allergies  Current Outpatient Prescriptions on File Prior to Visit  Medication Sig Dispense Refill  . EPINEPHrine (EPIPEN) 0.3 mg/0.3 mL DEVI Inject 0.3 mg into the muscle once.        . fexofenadine (ALLEGRA) 180 MG tablet Take 180 mg by mouth daily.        . finasteride (PROSCAR) 5 MG tablet Take 1 tablet (5 mg total) by mouth daily. 1/2 once daily  90 tablet  6  . fluticasone (FLONASE) 50 MCG/ACT nasal spray 2 sprays by Nasal route daily.  16 g  6  . hydrochlorothiazide (HYDRODIURIL) 12.5 MG tablet Take 1 tablet (12.5 mg total) by mouth daily.  90 tablet  6    BP 110/72  Pulse 66  Temp(Src) 98 F (36.7 C) (Oral)  Resp 16  Ht  (  1.549 m)  Wt 124 lb (56.246 kg)  BMI 23.43 kg/m2  SpO2 99%   Wt Readings from Last 3 Encounters:  07/30/11 124 lb (56.246 kg)  05/06/10 126 lb (57.153 kg)  01/09/10 128 lb (58.06 kg)    Review of Systems  Constitutional: Negative for fever, appetite change, fatigue and unexpected weight change.  HENT: Positive for congestion and rhinorrhea. Negative for hearing loss, ear pain, nosebleeds, sore throat, mouth sores, trouble swallowing, neck stiffness, dental problem, voice change, sinus pressure and tinnitus.   Eyes: Negative for photophobia, pain, redness and visual disturbance.  Respiratory: Negative for cough, chest tightness and shortness of breath.   Cardiovascular: Negative for chest pain, palpitations and leg swelling.  Gastrointestinal: Negative for nausea, vomiting, abdominal pain, diarrhea, constipation, blood in stool, abdominal distention and rectal pain.    Genitourinary: Negative for dysuria, urgency, frequency, hematuria, flank pain, vaginal bleeding, vaginal discharge, difficulty urinating, genital sores, vaginal pain, menstrual problem and pelvic pain.  Musculoskeletal: Negative for back pain and arthralgias.  Skin: Negative for rash.  Neurological: Negative for dizziness, syncope, speech difficulty, weakness, light-headedness, numbness and headaches.  Hematological: Negative for adenopathy. Does not bruise/bleed easily.  Psychiatric/Behavioral: Negative for suicidal ideas, behavioral problems, self-injury, dysphoric mood and agitation. The patient is not nervous/anxious.        Objective:   Physical Exam  Constitutional: She is oriented to person, place, and time. She appears well-developed and well-nourished.  HENT:  Head: Normocephalic and atraumatic.  Right Ear: External ear normal.  Left Ear: External ear normal.  Mouth/Throat: Oropharynx is clear and moist.  Eyes: Conjunctivae and EOM are normal.  Neck: Normal range of motion. Neck supple. No JVD present. No thyromegaly present.   Cardiovascular: Normal rate, regular rhythm, normal heart sounds and intact distal pulses.   No murmur heard.      Diminished right dorsalis pedis pulse  Pulmonary/Chest: Effort normal and breath sounds normal. She has no wheezes. She has no rales.  Abdominal: Soft. Bowel sounds are normal. She exhibits no distension and no mass. There is no tenderness. There is no rebound and no guarding.  Musculoskeletal: Normal range of motion. She exhibits no edema and no tenderness.  Neurological: She is alert and oriented to person, place, and time. She has normal reflexes. No cranial nerve deficit. She exhibits normal muscle tone. Coordination normal.  Skin: Skin is warm and dry. No rash noted.  Psychiatric: She has a normal mood and affect. Her behavior is normal.          Assessment & Plan:  Preventive health examination Essential hypertension. Well  controlled Allergic rhinitis stable H/o Bilateral carotid bruits (carotid duplex February 2 011)-     Review of Systems  Asym; recent gyn exam     Objective:   Physical Exam  Neck:  Left carotid bruit only   See above      Assessment & Plan:    Prev Health Status post ablation for atrial flutter Hypertension well controlled   Review of Systems     as above Objective:   Physical Exam   As above      Assessment & Plan:  Preventive health exam  Continue low-salt diet and home blood pressure monitoring Recheck one year  Rogelia [id number],PETER FRANK, MD

## 2015-09-15 NOTE — Patient Instructions (Signed)
Limit your sodium (Salt) intake  Please check your blood pressure on a regular basis.  If it is consistently greater than 150/90, please make an office appointment.    It is important that you exercise regularly, at least 20 minutes 3 to 4 times per week.  If you develop chest pain or shortness of breath seek  medical attention.  Return in one year for follow-up   Menopause is a normal process in which your reproductive ability comes to an end. This process happens gradually over a span of months to years, usually between the ages of 6 and 66. Menopause is complete when you have missed 12 consecutive menstrual periods. It is important to talk with your health care provider about some of the most common conditions that affect postmenopausal women, such as heart disease, cancer, and bone loss (osteoporosis). Adopting a healthy lifestyle and getting preventive care can help to promote your health and wellness. Those actions can also lower your chances of developing some of these common conditions. WHAT SHOULD I KNOW ABOUT MENOPAUSE? During menopause, you may experience a number of symptoms, such as:  Moderate-to-severe hot flashes.  Night sweats.  Decrease in sex drive.  Mood swings.  Headaches.  Tiredness.  Irritability.  Memory problems.  Insomnia. Choosing to treat or not to treat menopausal changes is an individual decision that you make with your health care provider. WHAT SHOULD I KNOW ABOUT HORMONE REPLACEMENT THERAPY AND SUPPLEMENTS? Hormone therapy products are effective for treating symptoms that are associated with menopause, such as hot flashes and night sweats. Hormone replacement carries certain risks, especially as you become older. If you are thinking about using estrogen or estrogen with progestin treatments, discuss the benefits and risks with your health care provider. WHAT SHOULD I KNOW ABOUT HEART DISEASE AND STROKE? Heart disease, heart attack, and stroke become  more likely as you age. This may be due, in part, to the hormonal changes that your body experiences during menopause. These can affect how your body processes dietary fats, triglycerides, and cholesterol. Heart attack and stroke are both medical emergencies. There are many things that you can do to help prevent heart disease and stroke:  Have your blood pressure checked at least every 1-2 years. High blood pressure causes heart disease and increases the risk of stroke.  If you are 39-26 years old, ask your health care provider if you should take aspirin to prevent a heart attack or a stroke.  Do not use any tobacco products, including cigarettes, chewing tobacco, or electronic cigarettes. If you need help quitting, ask your health care provider.  It is important to eat a healthy diet and maintain a healthy weight.  Be sure to include plenty of vegetables, fruits, low-fat dairy products, and lean protein.  Avoid eating foods that are high in solid fats, added sugars, or salt (sodium).  Get regular exercise. This is one of the most important things that you can do for your health.  Try to exercise for at least 150 minutes each week. The type of exercise that you do should increase your heart rate and make you sweat. This is known as moderate-intensity exercise.  Try to do strengthening exercises at least twice each week. Do these in addition to the moderate-intensity exercise.  Know your numbers.Ask your health care provider to check your cholesterol and your blood glucose. Continue to have your blood tested as directed by your health care provider. WHAT SHOULD I KNOW ABOUT CANCER SCREENING? There are several  types of cancer. Take the following steps to reduce your risk and to catch any cancer development as early as possible. Breast Cancer  Practice breast self-awareness.  This means understanding how your breasts normally appear and feel.  It also means doing regular breast  self-exams. Let your health care provider know about any changes, no matter how small.  If you are 79 or older, have a clinician do a breast exam (clinical breast exam or CBE) every year. Depending on your age, family history, and medical history, it may be recommended that you also have a yearly breast X-ray (mammogram).  If you have a family history of breast cancer, talk with your health care provider about genetic screening.  If you are at high risk for breast cancer, talk with your health care provider about having an MRI and a mammogram every year.  Breast cancer (BRCA) gene test is recommended for women who have family members with BRCA-related cancers. Results of the assessment will determine the need for genetic counseling and BRCA1 and for BRCA2 testing. BRCA-related cancers include these types:  Breast. This occurs in males or females.  Ovarian.  Tubal. This may also be called fallopian tube cancer.  Cancer of the abdominal or pelvic lining (peritoneal cancer).  Prostate.  Pancreatic. Cervical, Uterine, and Ovarian Cancer Your health care provider may recommend that you be screened regularly for cancer of the pelvic organs. These include your ovaries, uterus, and vagina. This screening involves a pelvic exam, which includes checking for microscopic changes to the surface of your cervix (Pap test).  For women ages 21-65, health care providers may recommend a pelvic exam and a Pap test every three years. For women ages 75-65, they may recommend the Pap test and pelvic exam, combined with testing for human papilloma virus (HPV), every five years. Some types of HPV increase your risk of cervical cancer. Testing for HPV may also be done on women of any age who have unclear Pap test results.  Other health care providers may not recommend any screening for nonpregnant women who are considered low risk for pelvic cancer and have no symptoms. Ask your health care provider if a screening  pelvic exam is right for you.  If you have had past treatment for cervical cancer or a condition that could lead to cancer, you need Pap tests and screening for cancer for at least 20 years after your treatment. If Pap tests have been discontinued for you, your risk factors (such as having a new sexual partner) need to be reassessed to determine if you should start having screenings again. Some women have medical problems that increase the chance of getting cervical cancer. In these cases, your health care provider may recommend that you have screening and Pap tests more often.  If you have a family history of uterine cancer or ovarian cancer, talk with your health care provider about genetic screening.  If you have vaginal bleeding after reaching menopause, tell your health care provider.  There are currently no reliable tests available to screen for ovarian cancer. Lung Cancer Lung cancer screening is recommended for adults 41-44 years old who are at high risk for lung cancer because of a history of smoking. A yearly low-dose CT scan of the lungs is recommended if you:  Currently smoke.  Have a history of at least 30 pack-years of smoking and you currently smoke or have quit within the past 15 years. A pack-year is smoking an average of one pack of  cigarettes per day for one year. Yearly screening should:  Continue until it has been 15 years since you quit.  Stop if you develop a health problem that would prevent you from having lung cancer treatment. Colorectal Cancer  This type of cancer can be detected and can often be prevented.  Routine colorectal cancer screening usually begins at age 41 and continues through age 28.  If you have risk factors for colon cancer, your health care provider may recommend that you be screened at an earlier age.  If you have a family history of colorectal cancer, talk with your health care provider about genetic screening.  Your health care provider  may also recommend using home test kits to check for hidden blood in your stool.  A small camera at the end of a tube can be used to examine your colon directly (sigmoidoscopy or colonoscopy). This is done to check for the earliest forms of colorectal cancer.  Direct examination of the colon should be repeated every 5-10 years until age 46. However, if early forms of precancerous polyps or small growths are found or if you have a family history or genetic risk for colorectal cancer, you may need to be screened more often. Skin Cancer  Check your skin from head to toe regularly.  Monitor any moles. Be sure to tell your health care provider:  About any new moles or changes in moles, especially if there is a change in a mole's shape or color.  If you have a mole that is larger than the size of a pencil eraser.  If any of your family members has a history of skin cancer, especially at a young age, talk with your health care provider about genetic screening.  Always use sunscreen. Apply sunscreen liberally and repeatedly throughout the day.  Whenever you are outside, protect yourself by wearing long sleeves, pants, a wide-brimmed hat, and sunglasses. WHAT SHOULD I KNOW ABOUT OSTEOPOROSIS? Osteoporosis is a condition in which bone destruction happens more quickly than new bone creation. After menopause, you may be at an increased risk for osteoporosis. To help prevent osteoporosis or the bone fractures that can happen because of osteoporosis, the following is recommended:  If you are 17-22 years old, get at least 1,000 mg of calcium and at least 600 mg of vitamin D per day.  If you are older than age 98 but younger than age 13, get at least 1,200 mg of calcium and at least 600 mg of vitamin D per day.  If you are older than age 17, get at least 1,200 mg of calcium and at least 800 mg of vitamin D per day. Smoking and excessive alcohol intake increase the risk of osteoporosis. Eat foods that are  rich in calcium and vitamin D, and do weight-bearing exercises several times each week as directed by your health care provider. WHAT SHOULD I KNOW ABOUT HOW MENOPAUSE AFFECTS [place]? Depression may occur at any age, but it is more common as you become older. Common symptoms of depression include:  Low or sad mood.  Changes in sleep patterns.  Changes in appetite or eating patterns.  Feeling an overall lack of motivation or enjoyment of activities that you previously enjoyed.  Frequent crying spells. Talk with your health care provider if you think that you are experiencing depression. WHAT SHOULD I KNOW ABOUT IMMUNIZATIONS? It is important that you get and maintain your immunizations. These include:  Tetanus, diphtheria, and pertussis (Tdap) booster vaccine.  Influenza every year before the flu season begins.  Pneumonia vaccine.  Shingles vaccine. Your health care provider may also recommend other immunizations.   This information is not intended to replace advice given to you by your health care provider. Make sure you discuss any questions you have with your health care provider.   Document Released: 04/16/2005 Document Revised: 03/15/2014 Document Reviewed: 10/25/2013 Elsevier Interactive Patient Education [employer].

## 2015-09-15 NOTE — Progress Notes (Signed)
Pre visit review using our clinic review tool, if applicable. No additional management support is needed unless otherwise documented below in the visit note. 

## 2015-09-19 ENCOUNTER — Telehealth: Payer: Self-pay | Admitting: Internal Medicine

## 2015-09-19 DIAGNOSIS — I4892 Unspecified atrial flutter: Secondary | ICD-10-CM

## 2015-09-19 MED ORDER — FINASTERIDE 5 MG PO TABS: 2.5000 mg | ORAL_TABLET | Freq: Every day | ORAL | Status: DC

## 2015-09-19 MED ORDER — CARVEDILOL 12.5 MG PO TABS: 6.2500 mg | ORAL_TABLET | Freq: Two times a day (BID) | ORAL | Status: DC

## 2015-09-19 MED ORDER — HYDROCHLOROTHIAZIDE 25 MG PO TABS: 25.0000 mg | ORAL_TABLET | Freq: Every day | ORAL | Status: DC

## 2015-09-19 NOTE — Telephone Encounter (Signed)
Pt has cpe on 7/10 and her meds are not at the pharmacy. Can you call in:  carvedilol (COREG) 12.5 MG tablet hydrochlorothiazide (HYDRODIURIL) 25 MG tablet finasteride (PROSCAR) 5 MG tablet  90 day [facility]/ [place]

## 2015-09-19 NOTE — Telephone Encounter (Signed)
Refills sent to pharmacy. 

## 2015-09-26 ENCOUNTER — Other Ambulatory Visit: Payer: Self-pay | Admitting: Emergency Medicine

## 2015-12-08 ENCOUNTER — Other Ambulatory Visit: Payer: Self-pay | Admitting: *Deleted

## 2015-12-08 DIAGNOSIS — I4892 Unspecified atrial flutter: Secondary | ICD-10-CM

## 2015-12-08 MED ORDER — CARVEDILOL 12.5 MG PO TABS: 6.2500 mg | ORAL_TABLET | Freq: Two times a day (BID) | ORAL | 3 refills | Status: DC

## 2016-04-05 ENCOUNTER — Telehealth: Payer: Self-pay | Admitting: Internal Medicine

## 2016-04-05 NOTE — Telephone Encounter (Signed)
Okay to transfer but no immediate retirement plans

## 2016-04-05 NOTE — Telephone Encounter (Signed)
Pt asking to transfer care from Dr. Amador CunasKwiatkowski to Dr. Beverely Lowabori, due to Dr. Amador CunasKwiatkowski retiring. Pt advise ok to schedule.

## 2016-04-06 ENCOUNTER — Telehealth: Payer: Self-pay | Admitting: Internal Medicine

## 2016-04-06 NOTE — Telephone Encounter (Signed)
OK to switch to Dr Beverely Lowabori

## 2016-04-06 NOTE — Telephone Encounter (Signed)
LM advising pt of Dr. Rennis Goldenabori's decision to decline pt transfer of care.

## 2016-04-06 NOTE — Telephone Encounter (Signed)
Pt would like to switch to Dr Beverely Lowabori from DR K. Pt's husband sees Dr Beverely Lowabori, and they live right down the street from the [place] office. Pt states she knows she has been seeing Dr Kirtland BouchardK a long time, but it is much more convenient for her and she is looking at the long term picture. Ok for pt to switch? She is not in a hurry either.

## 2016-04-06 NOTE — Telephone Encounter (Signed)
I decline to accept at this time as pt's current provider has no immediate plans to retire

## 2016-04-07 NOTE — Telephone Encounter (Signed)
Since pt's husband is here, we will see her as well

## 2016-04-09 ENCOUNTER — Ambulatory Visit: Payer: Managed Care, Other (non HMO) | Admitting: Internal Medicine

## 2016-04-09 IMAGING — CR DG CHEST 2V
2 series · 2 of 2 positions shown · non-contrast
Comparison: 12/25/2009

CLINICAL DATA: Tachycardia.

EXAM:
CHEST  2 VIEW

[w chest pa]
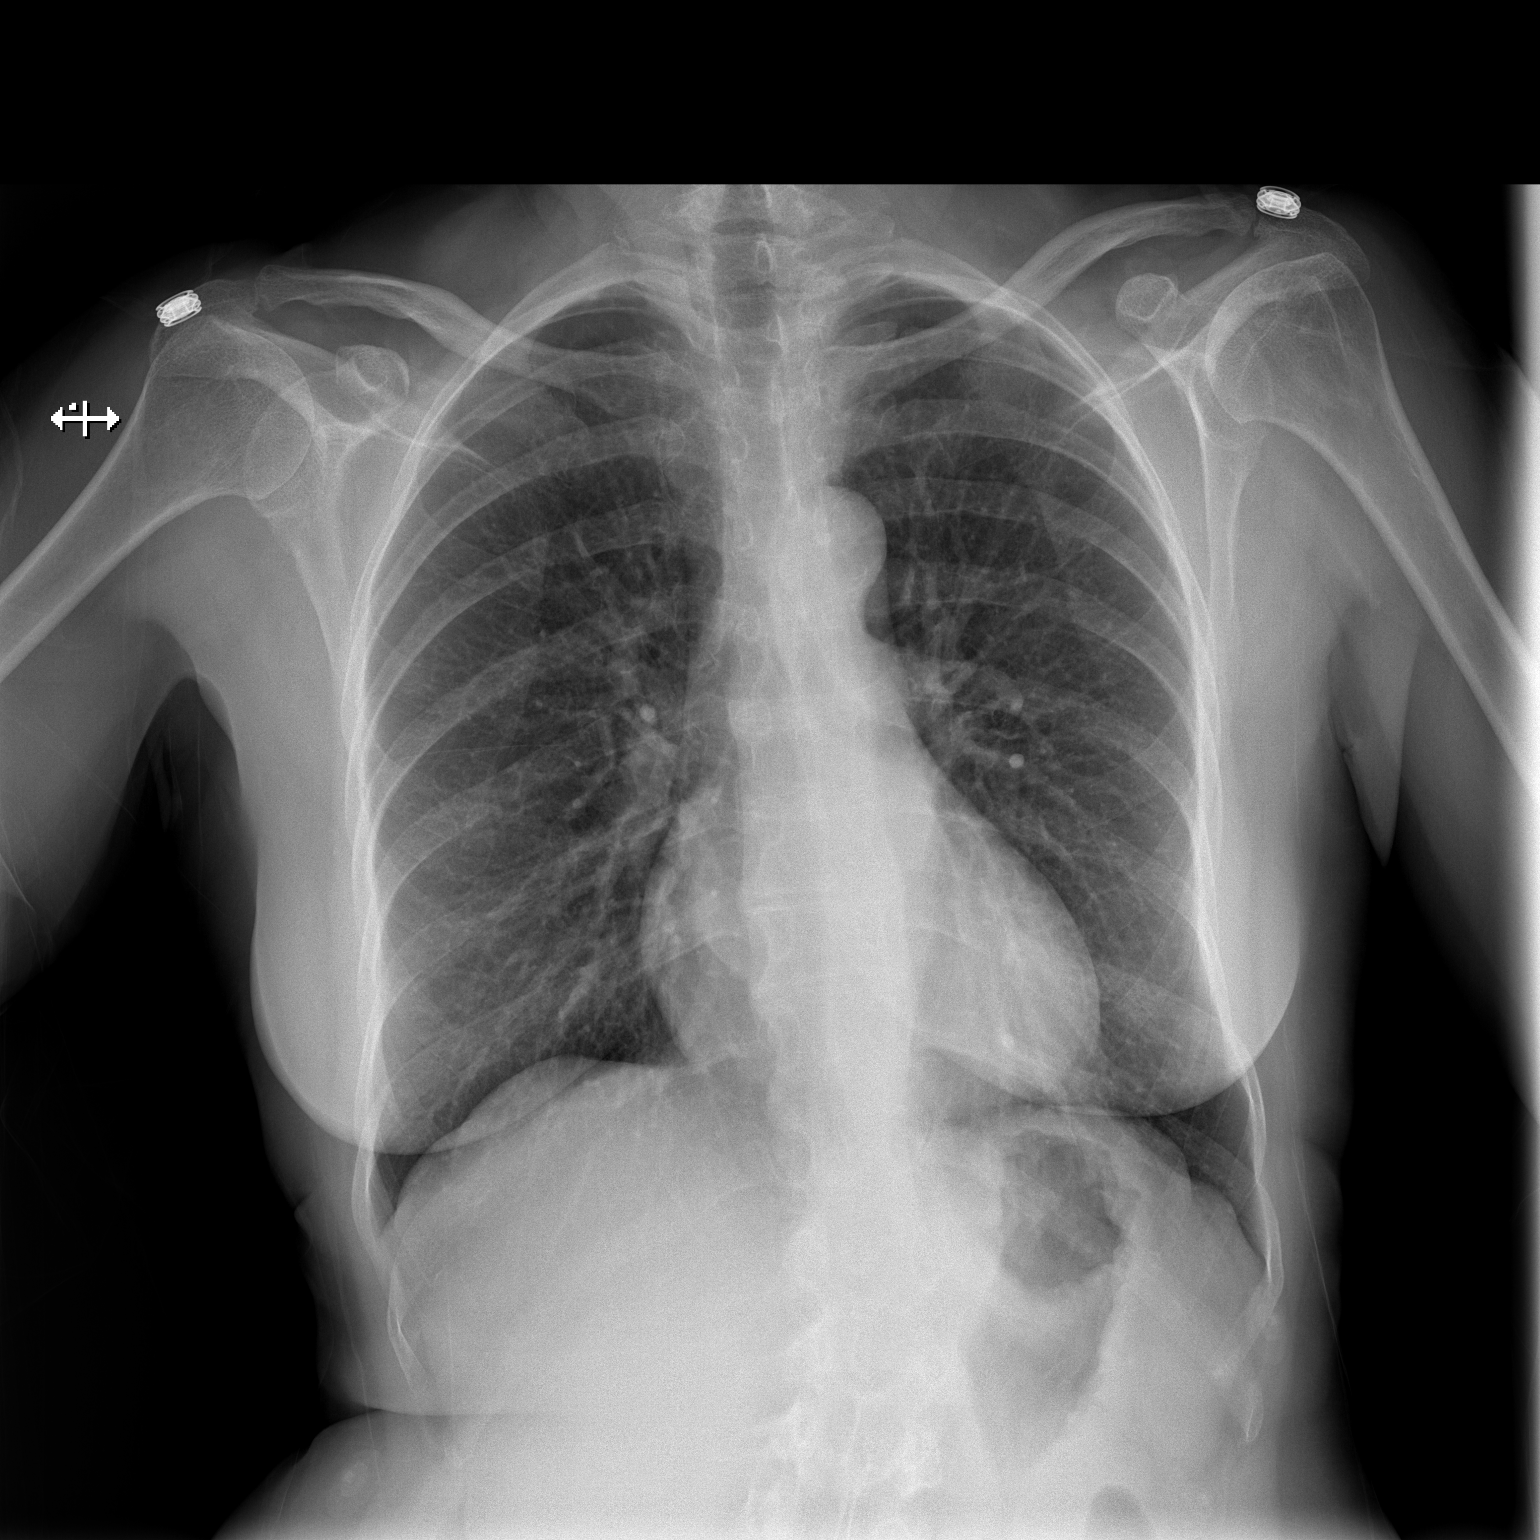

[w chest lat]
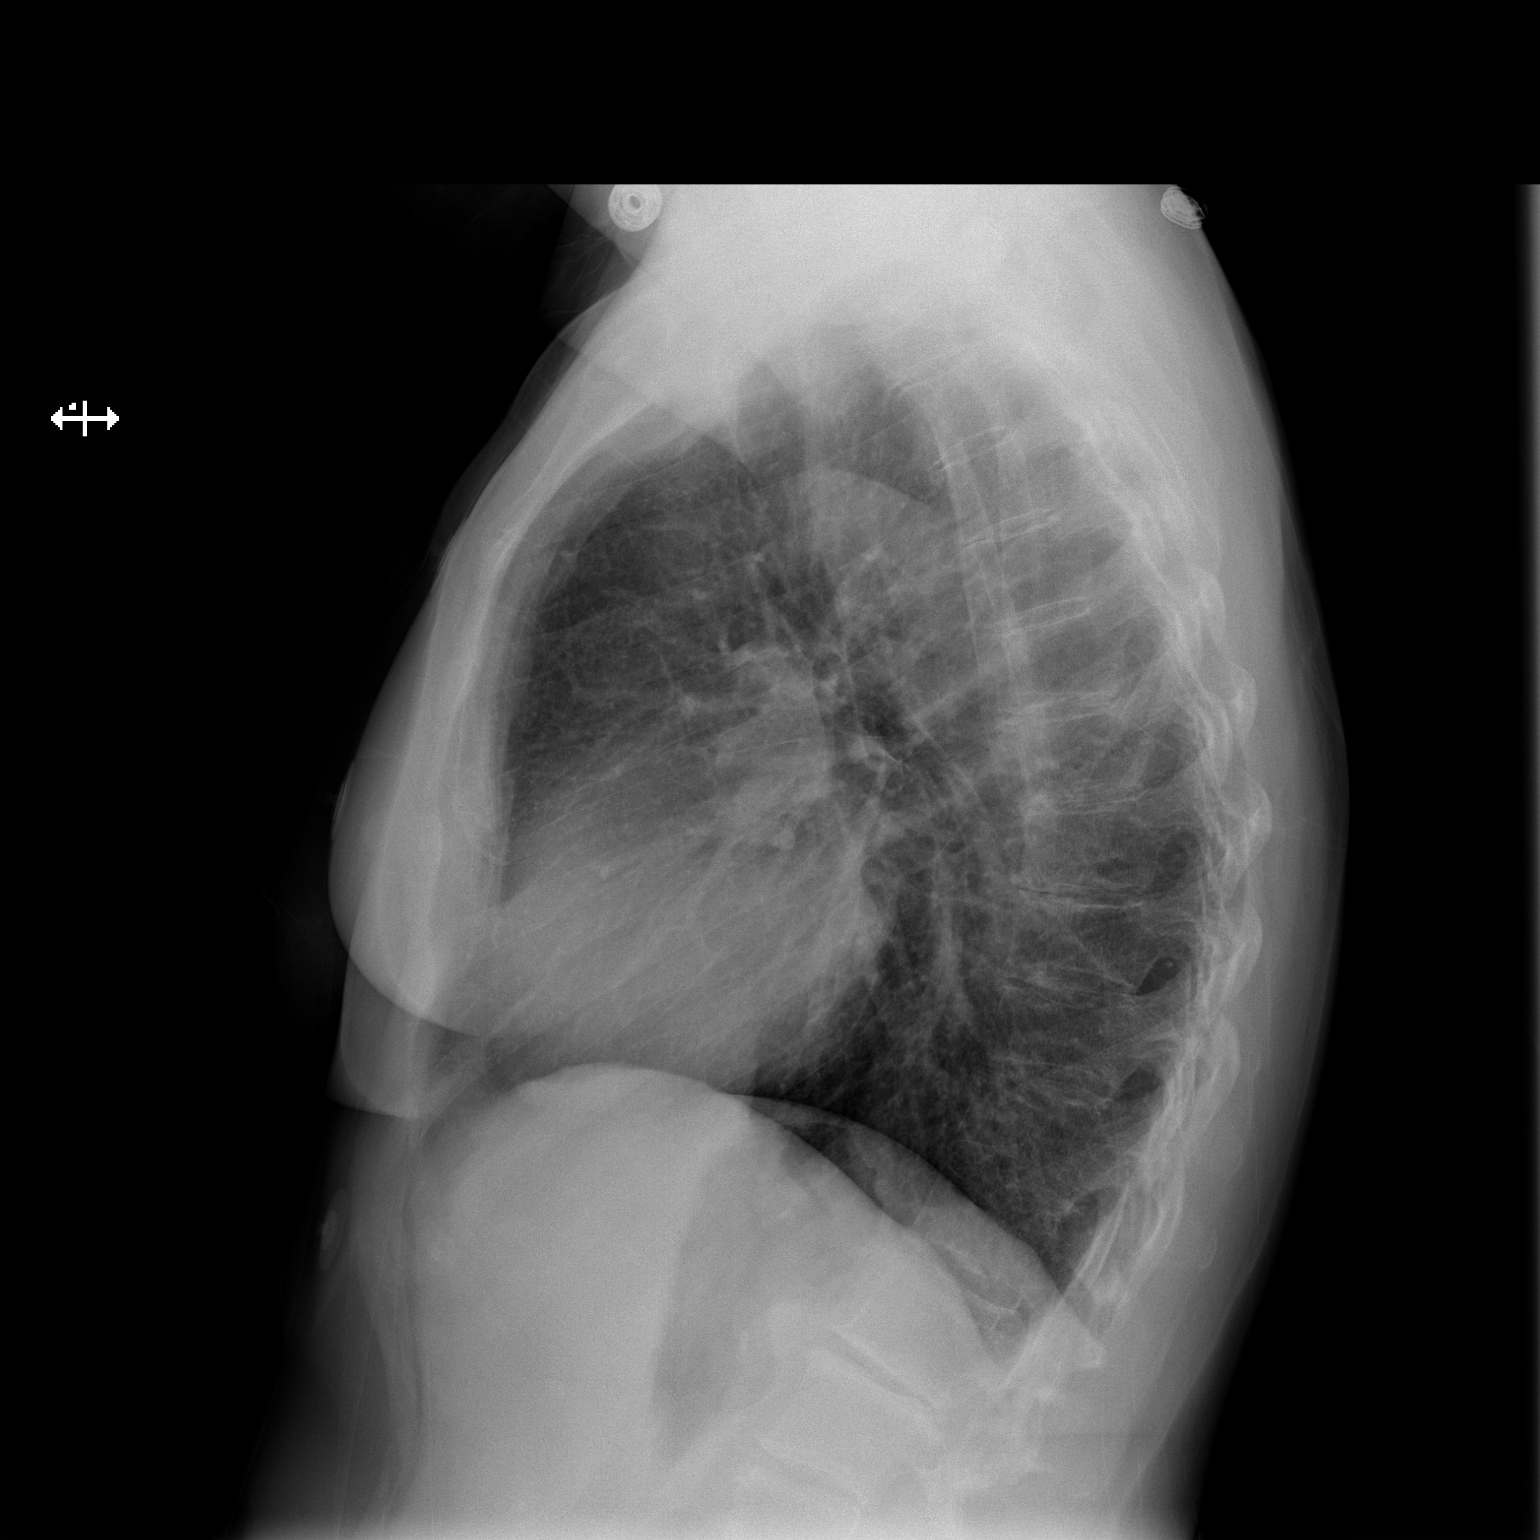

[2 of 2 positions shown; findings below may reference images not displayed]

FINDINGS: The heart size and mediastinal contours are within normal limits.
Both lungs are clear. There is a moderate thoracolumbar scoliosis.
IMPRESSION: No active cardiopulmonary disease.

## 2016-04-15 NOTE — Telephone Encounter (Signed)
Pt aware and very pleased. Thank you!

## 2016-09-06 LAB — HM MAMMOGRAPHY: HM MAMMO: NORMAL (ref 0–4)

## 2016-09-15 ENCOUNTER — Ambulatory Visit (INDEPENDENT_AMBULATORY_CARE_PROVIDER_SITE_OTHER): Payer: Managed Care, Other (non HMO) | Admitting: Family Medicine

## 2016-09-15 ENCOUNTER — Encounter: Payer: Self-pay | Admitting: Family Medicine

## 2016-09-15 VITALS — BP 123/83 | HR 80 | Temp 98.1°F | Resp 16 | Ht 60.0 in | Wt 126.0 lb

## 2016-09-15 DIAGNOSIS — I4892 Unspecified atrial flutter: Secondary | ICD-10-CM | POA: Diagnosis not present

## 2016-09-15 DIAGNOSIS — K219 Gastro-esophageal reflux disease without esophagitis: Secondary | ICD-10-CM

## 2016-09-15 DIAGNOSIS — I10 Essential (primary) hypertension: Secondary | ICD-10-CM | POA: Diagnosis not present

## 2016-09-15 DIAGNOSIS — Z0000 Encounter for general adult medical examination without abnormal findings: Secondary | ICD-10-CM | POA: Diagnosis not present

## 2016-09-15 DIAGNOSIS — L659 Nonscarring hair loss, unspecified: Secondary | ICD-10-CM | POA: Diagnosis not present

## 2016-09-15 LAB — LIPID PANEL
CHOLESTEROL: 243 mg/dL — AB (ref 0–200)
HDL: 53.5 mg/dL (ref >39.00)
LDL Cholesterol: 157 mg/dL — ABNORMAL HIGH (ref 0–99)
NonHDL: 189.64
TRIGLYCERIDES: 165 mg/dL — AB (ref 0.0–149.0)
Total CHOL/HDL Ratio: 5
VLDL: 33 mg/dL (ref 0.0–40.0)

## 2016-09-15 LAB — CBC WITH DIFFERENTIAL/PLATELET
Basophils Absolute: 0 K/uL (ref 0.0–0.1)
Basophils Relative: 1.1 % (ref 0.0–3.0)
Eosinophils Absolute: 0.2 K/uL (ref 0.0–0.7)
Eosinophils Relative: 5.2 % — ABNORMAL HIGH (ref 0.0–5.0)
HCT: 41.7 % (ref 36.0–46.0)
Hemoglobin: 13.8 g/dL (ref 12.0–15.0)
Lymphocytes Relative: 39 % (ref 12.0–46.0)
Lymphs Abs: 1.7 K/uL (ref 0.7–4.0)
MCHC: 33 g/dL (ref 30.0–36.0)
MCV: 89.4 fl (ref 78.0–100.0)
Monocytes Absolute: 0.3 K/uL (ref 0.1–1.0)
Monocytes Relative: 7.7 % (ref 3.0–12.0)
Neutro Abs: 2 K/uL (ref 1.4–7.7)
Neutrophils Relative %: 47 % (ref 43.0–77.0)
Platelets: 255 K/uL (ref 150.0–400.0)
RBC: 4.66 Mil/uL (ref 3.87–5.11)
RDW: 13.6 % (ref 11.5–15.5)
WBC: 4.3 K/uL (ref 4.0–10.5)

## 2016-09-15 LAB — BASIC METABOLIC PANEL
BUN: 11 mg/dL (ref 6–23)
CHLORIDE: 102 meq/L (ref 96–112)
CO2: 29 mEq/L (ref 19–32)
CREATININE: 0.68 mg/dL (ref 0.40–1.20)
Calcium: 9.7 mg/dL (ref 8.4–10.5)
GFR: 91.45 mL/min (ref >60.00)
GLUCOSE: 94 mg/dL (ref 70–99)
POTASSIUM: 3.6 meq/L (ref 3.5–5.1)
Sodium: 140 mEq/L (ref 135–145)

## 2016-09-15 LAB — TSH: TSH: 2.43 u[IU]/mL (ref 0.35–4.50)

## 2016-09-15 LAB — HEPATIC FUNCTION PANEL
ALT: 19 U/L (ref 0–35)
AST: 17 U/L (ref 0–37)
Albumin: 4.2 g/dL (ref 3.5–5.2)
Alkaline Phosphatase: 84 U/L (ref 39–117)
Bilirubin, Direct: 0.2 mg/dL (ref 0.0–0.3)
Total Bilirubin: 1.3 mg/dL — ABNORMAL HIGH (ref 0.2–1.2)
Total Protein: 6.7 g/dL (ref 6.0–8.3)

## 2016-09-15 LAB — VITAMIN D 25 HYDROXY (VIT D DEFICIENCY, FRACTURES): VITD: 82.37 ng/mL (ref 30.00–100.00)

## 2016-09-15 NOTE — Progress Notes (Signed)
Pre visit review using our clinic review tool, if applicable. No additional management support is needed unless otherwise documented below in the visit note. 

## 2016-09-15 NOTE — Assessment & Plan Note (Signed)
Pt's PE WNL.  UTD on colonoscopy, mammo, immunizations.  Check labs.  Anticipatory guidance provided.  

## 2016-09-15 NOTE — Assessment & Plan Note (Signed)
Chronic problem.  Well controlled.  Asymptomatic.  On HCTZ and Coreg.  Check labs.  No anticipated med changes.  Will follow.

## 2016-09-15 NOTE — Assessment & Plan Note (Signed)
New to provider, ongoing for pt.  On Proscar daily.

## 2016-09-15 NOTE — Patient Instructions (Signed)
Follow up in 6 months to recheck BP and cholesterol We'll notify you of your lab results and make any changes if needed Continue to work on healthy diet and regular exercise- you look great! You are up to date on colonoscopy, mammo, and immunizations- yay!!! Call with any questions or concerns Welcome!  We're glad to have you!!!

## 2016-09-15 NOTE — Assessment & Plan Note (Signed)
Chronic problem.  Asymptomatic.  On ASA 81mg  daily and Coreg BID.  No longer seeing Dr Ladona Ridgelaylor.  Will continue to monitor.

## 2016-09-15 NOTE — Progress Notes (Signed)
   Subjective:    Patient ID: Chelsea KitchensVickie S Morgan, female    DOB: 12/23/1950, 66 y.o.   MRN: [id number]  HPI New to establish.  Previous MD- Dr Kirtland BouchardK.  Cards- Dr Ladona Ridgelaylor (last seen 2016)  UTD on colonoscopy until 2026.  UTD on mammo.  UTD on immunizations.  Paroxysmal A flutter- chronic problem, was following w/ Dr Ladona Ridgelaylor but has not been seen since 2016.  On ASA 81 mg and Coreg BID w/ good control.  Denies CP, SOB, edema.  Hair loss- pt is taking finasteride daily.  Also on Biotin.  HTN- chronic problem, on HCTZ 25mg  daily and Coreg twice daily w/ good control.  No CP, SOB, some intermittent palpitations that seem to correlate w/ reflux.  No swelling of hands/feet.  GERD- ongoing issue for pt.  She is taking OTC PPI or Tums prn.  Pt notes worsening sxs after overeating or bending over after eating.  CPE- pt is interested in CPE   Review of Systems Patient reports no vision/ hearing changes, adenopathy,fever, weight change,  persistant/recurrent hoarseness , swallowing issues, chest pain, palpitations, edema, persistant/recurrent cough, hemoptysis, dyspnea (rest/exertional/paroxysmal nocturnal), gastrointestinal bleeding (melena, rectal bleeding), abdominal pain, significant heartburn, bowel changes, GU symptoms (dysuria, hematuria, incontinence), Gyn symptoms (abnormal  bleeding, pain),  syncope, focal weakness, memory loss, numbness & tingling, skin/hair/nail changes, abnormal bruising or bleeding, anxiety, or depression.     Objective:   Physical Exam General Appearance:    Alert, cooperative, no distress, appears stated age  Head:    Normocephalic, without obvious abnormality, atraumatic  Eyes:    PERRL, conjunctiva/corneas clear, EOM's intact, fundi    benign, both eyes  Ears:    Normal TM's and external ear canals, both ears  Nose:   Nares normal, septum midline, mucosa normal, no drainage    or sinus tenderness  Throat:   Lips, mucosa, and tongue normal; teeth and gums normal  Neck:    Supple, symmetrical, trachea midline, no adenopathy;    Thyroid: no enlargement/tenderness/nodules  Back:     Symmetric, no curvature, ROM normal, no CVA tenderness  Lungs:     Clear to auscultation bilaterally, respirations unlabored  Chest Wall:    No tenderness or deformity   Heart:    Regular rate and rhythm, S1 and S2 normal, no murmur, rub   or gallop  Breast Exam:    Deferred to GYN  Abdomen:     Soft, non-tender, bowel sounds active all four quadrants,    no masses, no organomegaly  Genitalia:    Deferred to GYN  Rectal:    Extremities:   Extremities normal, atraumatic, no cyanosis or edema  Pulses:   2+ and symmetric all extremities  Skin:   Skin color, texture, turgor normal, no rashes or lesions  Lymph nodes:   Cervical, supraclavicular, and axillary nodes normal  Neurologic:   CNII-XII intact, normal strength, sensation and reflexes    throughout          Assessment & Plan:

## 2016-09-15 NOTE — Assessment & Plan Note (Signed)
New.  Pt has hx of similar.  Reports sxs are adequately controlled w/ OTC Tums/PPI.  Reviewed lifestyle and dietary modifications.  Discussed that if pt continues to have breakthrough sxs, will need to start daily PPI.  Will follow.

## 2016-09-17 ENCOUNTER — Telehealth: Payer: Self-pay | Admitting: Internal Medicine

## 2016-09-17 ENCOUNTER — Telehealth: Payer: Self-pay | Admitting: Family Medicine

## 2016-09-17 NOTE — Telephone Encounter (Signed)
Called pt back and LMOVM to return call 

## 2016-09-17 NOTE — Telephone Encounter (Signed)
error 

## 2016-09-17 NOTE — Progress Notes (Signed)
Called pt and lmovm to return call.

## 2016-09-17 NOTE — Telephone Encounter (Signed)
Patient requesting call back with lab results.

## 2016-09-20 ENCOUNTER — Other Ambulatory Visit: Payer: Self-pay | Admitting: Family Medicine

## 2016-09-20 DIAGNOSIS — E785 Hyperlipidemia, unspecified: Secondary | ICD-10-CM

## 2016-10-11 ENCOUNTER — Other Ambulatory Visit: Payer: Self-pay | Admitting: Family Medicine

## 2016-10-11 DIAGNOSIS — I10 Essential (primary) hypertension: Secondary | ICD-10-CM

## 2016-10-11 DIAGNOSIS — I4892 Unspecified atrial flutter: Secondary | ICD-10-CM

## 2016-10-11 DIAGNOSIS — L659 Nonscarring hair loss, unspecified: Secondary | ICD-10-CM

## 2016-10-11 MED ORDER — HYDROCHLOROTHIAZIDE 25 MG PO TABS: 25.0000 mg | ORAL_TABLET | Freq: Every day | ORAL | 3 refills | Status: DC

## 2016-10-11 MED ORDER — FINASTERIDE 5 MG PO TABS: 2.5000 mg | ORAL_TABLET | Freq: Every day | ORAL | 3 refills | Status: DC

## 2016-10-11 MED ORDER — CARVEDILOL 12.5 MG PO TABS: 6.2500 mg | ORAL_TABLET | Freq: Two times a day (BID) | ORAL | 3 refills | Status: DC

## 2016-10-11 NOTE — Telephone Encounter (Signed)
Reviewed chart and saw that patient does currently take these medications.  Medications have been called in and patient is aware that they have been sent to the pharmacy.

## 2016-10-11 NOTE — Telephone Encounter (Signed)
Patient called stating that she was supposed to get a refill on her meds when she was at her CPE, but did not. She needs a refill on her Carvedilol, Hydrochlorothiazide and her Finasteride. [facility] [place] [facility] [address] and requests 90 day refill. Please advise.

## 2016-12-07 ENCOUNTER — Other Ambulatory Visit (INDEPENDENT_AMBULATORY_CARE_PROVIDER_SITE_OTHER): Payer: Managed Care, Other (non HMO)

## 2016-12-07 DIAGNOSIS — E785 Hyperlipidemia, unspecified: Secondary | ICD-10-CM

## 2016-12-07 LAB — HEPATIC FUNCTION PANEL
ALBUMIN: 4.2 g/dL (ref 3.5–5.2)
ALK PHOS: 73 U/L (ref 39–117)
ALT: 16 U/L (ref 0–35)
AST: 17 U/L (ref 0–37)
Bilirubin, Direct: 0.1 mg/dL (ref 0.0–0.3)
TOTAL PROTEIN: 6.6 g/dL (ref 6.0–8.3)
Total Bilirubin: 0.9 mg/dL (ref 0.2–1.2)

## 2016-12-07 LAB — LIPID PANEL
Cholesterol: 215 mg/dL — ABNORMAL HIGH (ref 0–200)
HDL: 51.9 mg/dL (ref >39.00)
LDL Cholesterol: 142 mg/dL — ABNORMAL HIGH (ref 0–99)
NONHDL: 162.73
Total CHOL/HDL Ratio: 4
Triglycerides: 106 mg/dL (ref 0.0–149.0)
VLDL: 21.2 mg/dL (ref 0.0–40.0)

## 2016-12-08 NOTE — Progress Notes (Signed)
Called pt and lmovm to return call.

## 2016-12-09 ENCOUNTER — Other Ambulatory Visit: Payer: Self-pay | Admitting: General Practice

## 2016-12-09 DIAGNOSIS — E785 Hyperlipidemia, unspecified: Secondary | ICD-10-CM

## 2016-12-09 MED ORDER — ATORVASTATIN CALCIUM 20 MG PO TABS: 20.0000 mg | ORAL_TABLET | Freq: Every day | ORAL | 3 refills | Status: DC

## 2017-03-16 ENCOUNTER — Encounter: Payer: Self-pay | Admitting: General Practice

## 2017-03-16 ENCOUNTER — Other Ambulatory Visit: Payer: Self-pay

## 2017-03-16 ENCOUNTER — Ambulatory Visit: Payer: Managed Care, Other (non HMO) | Admitting: Family Medicine

## 2017-03-16 ENCOUNTER — Encounter: Payer: Self-pay | Admitting: Family Medicine

## 2017-03-16 VITALS — BP 108/68 | HR 63 | Temp 98.0°F | Resp 16 | Ht 60.0 in | Wt 121.2 lb

## 2017-03-16 DIAGNOSIS — E785 Hyperlipidemia, unspecified: Secondary | ICD-10-CM | POA: Diagnosis not present

## 2017-03-16 DIAGNOSIS — I10 Essential (primary) hypertension: Secondary | ICD-10-CM

## 2017-03-16 DIAGNOSIS — J309 Allergic rhinitis, unspecified: Secondary | ICD-10-CM

## 2017-03-16 LAB — LIPID PANEL
CHOLESTEROL: 177 mg/dL (ref 0–200)
HDL: 60.8 mg/dL (ref >39.00)
LDL Cholesterol: 105 mg/dL — ABNORMAL HIGH (ref 0–99)
NonHDL: 116.51
Total CHOL/HDL Ratio: 3
Triglycerides: 59 mg/dL (ref 0.0–149.0)
VLDL: 11.8 mg/dL (ref 0.0–40.0)

## 2017-03-16 LAB — CBC WITH DIFFERENTIAL/PLATELET
BASOS ABS: 0 10*3/uL (ref 0.0–0.1)
Basophils Relative: 0.7 % (ref 0.0–3.0)
Eosinophils Absolute: 0.2 10*3/uL (ref 0.0–0.7)
Eosinophils Relative: 4.2 % (ref 0.0–5.0)
HEMATOCRIT: 42 % (ref 36.0–46.0)
Hemoglobin: 13.7 g/dL (ref 12.0–15.0)
LYMPHS ABS: 1.2 10*3/uL (ref 0.7–4.0)
Lymphocytes Relative: 27 % (ref 12.0–46.0)
MCHC: 32.8 g/dL (ref 30.0–36.0)
MCV: 90.9 fl (ref 78.0–100.0)
MONO ABS: 0.3 10*3/uL (ref 0.1–1.0)
Monocytes Relative: 7.6 % (ref 3.0–12.0)
NEUTROS ABS: 2.7 10*3/uL (ref 1.4–7.7)
NEUTROS PCT: 60.5 % (ref 43.0–77.0)
PLATELETS: 262 10*3/uL (ref 150.0–400.0)
RBC: 4.62 Mil/uL (ref 3.87–5.11)
RDW: 13.4 % (ref 11.5–15.5)
WBC: 4.5 10*3/uL (ref 4.0–10.5)

## 2017-03-16 LAB — BASIC METABOLIC PANEL
BUN: 11 mg/dL (ref 6–23)
CHLORIDE: 103 meq/L (ref 96–112)
CO2: 30 meq/L (ref 19–32)
Calcium: 9.6 mg/dL (ref 8.4–10.5)
Creatinine, Ser: 0.66 mg/dL (ref 0.40–1.20)
GFR: 94.52 mL/min (ref >60.00)
GLUCOSE: 98 mg/dL (ref 70–99)
Potassium: 4 mEq/L (ref 3.5–5.1)
Sodium: 140 mEq/L (ref 135–145)

## 2017-03-16 LAB — HEPATIC FUNCTION PANEL
ALK PHOS: 78 U/L (ref 39–117)
ALT: 22 U/L (ref 0–35)
AST: 19 U/L (ref 0–37)
Albumin: 4.3 g/dL (ref 3.5–5.2)
BILIRUBIN TOTAL: 0.9 mg/dL (ref 0.2–1.2)
Bilirubin, Direct: 0.2 mg/dL (ref 0.0–0.3)
TOTAL PROTEIN: 6.4 g/dL (ref 6.0–8.3)

## 2017-03-16 LAB — TSH: TSH: 2.36 u[IU]/mL (ref 0.35–4.50)

## 2017-03-16 NOTE — Assessment & Plan Note (Signed)
Chronic problem.  Well controlled today.  Asymptomatic.  Check labs.  No anticipated med changes.  Will follow. 

## 2017-03-16 NOTE — Patient Instructions (Signed)
Schedule your complete physical in 6 months We'll notify you of your lab results and make any changes if needed Keep up the good work on healthy diet and regular exercise- you look great!!! Add a daily Claritin or Zyrtec to help dry up the post nasal drip and help w/ the cough Drink plenty of fluids Call with any questions or concerns Happy New Year!!!

## 2017-03-16 NOTE — Assessment & Plan Note (Signed)
Deteriorated.  Pt is having increased PND which is subsequently causing cough.  Encouraged her to start daily antihistamine.  Pt expressed understanding and is in agreement w/ plan.

## 2017-03-16 NOTE — Progress Notes (Signed)
   Subjective:    Patient ID: Chelsea Morgan, female    DOB: 12/05/1950, 67 y.o.   MRN: [id number]  HPI HTN- chronic problem, on Coreg BID, HCTZ daily w/ excellent control.  Pt has lost 5 lbs since last visit.  No CP, SOB, HAs, visual changes, edema.  Hyperlipidemia- chronic problem, pt never started her cholesterol medication.  Pt reports she has been much more vigilant about her eating habits.  Exercising regularly.  Started Red Yeast Rice daily  Cough- sxs started ~1 month ago.  'a lot' of drainage.  No fevers.  + hoarseness.  Not currently on allergy meds.   Review of Systems For ROS see HPI     Objective:   Physical Exam  Constitutional: She is oriented to person, place, and time. She appears well-developed and well-nourished. No distress.  HENT:  Head: Normocephalic and atraumatic.  Right Ear: Tympanic membrane normal.  Left Ear: Tympanic membrane normal.  Nose: Mucosal edema and rhinorrhea present. Right sinus exhibits no maxillary sinus tenderness and no frontal sinus tenderness. Left sinus exhibits no maxillary sinus tenderness and no frontal sinus tenderness.  Mouth/Throat: Mucous membranes are normal. Posterior oropharyngeal erythema (w/ PND) present.  Eyes: Conjunctivae and EOM are normal. Pupils are equal, round, and reactive to light.  Neck: Normal range of motion. Neck supple. No thyromegaly present.  Cardiovascular: Normal rate, regular rhythm, normal heart sounds and intact distal pulses.  No murmur heard. Pulmonary/Chest: Effort normal and breath sounds normal. No respiratory distress. She has no wheezes. She has no rales.  Abdominal: Soft. She exhibits no distension. There is no tenderness.  Musculoskeletal: She exhibits no edema.  Lymphadenopathy:    She has no cervical adenopathy.  Neurological: She is alert and oriented to person, place, and time.  Skin: Skin is warm and dry.  Psychiatric: She has a normal mood and affect. Her behavior is normal.  Vitals  reviewed.         Assessment & Plan:

## 2017-03-16 NOTE — Assessment & Plan Note (Signed)
Pt is much more strict on her diet and is exercising regularly in addition to taking Red Yeast Rice.  Check labs.  Adjust tx prn

## 2017-03-18 ENCOUNTER — Telehealth: Payer: Self-pay | Admitting: Family Medicine

## 2017-03-18 NOTE — Telephone Encounter (Signed)
Copied from CRM [phone]. Topic: Quick Communication - See Telephone Encounter >> Mar 18, 2017  9:53 AM Diana EvesHoyt, Maryann B wrote: CRM for notification. See Telephone encounter for:  Pt needing Dr. Rennis Goldenabori's nurse to call about something that Earleen Reapermychart is stating she needs lab work done due to being on cholesterol medication and she doesn't take that. Please call work number first  03/18/17.

## 2017-03-18 NOTE — Telephone Encounter (Signed)
Clarified with patient - she was not looking at her most recent lab results. She was looking at some from  October.  Patient is understanding of her current labs.

## 2017-09-20 ENCOUNTER — Encounter: Payer: Managed Care, Other (non HMO) | Admitting: Family Medicine

## 2017-09-21 ENCOUNTER — Encounter: Payer: Managed Care, Other (non HMO) | Admitting: Family Medicine

## 2017-10-10 ENCOUNTER — Other Ambulatory Visit: Payer: Self-pay | Admitting: Family Medicine

## 2017-10-10 DIAGNOSIS — I10 Essential (primary) hypertension: Secondary | ICD-10-CM

## 2017-10-27 DIAGNOSIS — Z01419 Encounter for gynecological examination (general) (routine) without abnormal findings: Secondary | ICD-10-CM | POA: Diagnosis not present

## 2017-10-27 DIAGNOSIS — Z1231 Encounter for screening mammogram for malignant neoplasm of breast: Secondary | ICD-10-CM | POA: Diagnosis not present

## 2017-10-28 LAB — HM MAMMOGRAPHY: HM Mammogram: NORMAL (ref 0–4)

## 2017-11-01 ENCOUNTER — Encounter: Payer: Self-pay | Admitting: General Practice

## 2017-12-21 DIAGNOSIS — R69 Illness, unspecified: Secondary | ICD-10-CM | POA: Diagnosis not present

## 2018-01-05 ENCOUNTER — Other Ambulatory Visit: Payer: Self-pay | Admitting: *Deleted

## 2018-01-05 DIAGNOSIS — I10 Essential (primary) hypertension: Secondary | ICD-10-CM

## 2018-01-09 ENCOUNTER — Other Ambulatory Visit (INDEPENDENT_AMBULATORY_CARE_PROVIDER_SITE_OTHER): Payer: Medicare HMO

## 2018-01-09 DIAGNOSIS — I10 Essential (primary) hypertension: Secondary | ICD-10-CM

## 2018-01-09 LAB — CBC WITH DIFFERENTIAL/PLATELET
Basophils Absolute: 0 10*3/uL (ref 0.0–0.1)
Basophils Relative: 0.9 % (ref 0.0–3.0)
EOS PCT: 4.3 % (ref 0.0–5.0)
Eosinophils Absolute: 0.2 10*3/uL (ref 0.0–0.7)
HCT: 44.4 % (ref 36.0–46.0)
Hemoglobin: 14.6 g/dL (ref 12.0–15.0)
Lymphocytes Relative: 29.1 % (ref 12.0–46.0)
Lymphs Abs: 1.3 10*3/uL (ref 0.7–4.0)
MCHC: 32.8 g/dL (ref 30.0–36.0)
MCV: 90.8 fl (ref 78.0–100.0)
MONOS PCT: 7.4 % (ref 3.0–12.0)
Monocytes Absolute: 0.3 10*3/uL (ref 0.1–1.0)
NEUTROS ABS: 2.6 10*3/uL (ref 1.4–7.7)
NEUTROS PCT: 58.3 % (ref 43.0–77.0)
PLATELETS: 252 10*3/uL (ref 150.0–400.0)
RBC: 4.89 Mil/uL (ref 3.87–5.11)
RDW: 13.5 % (ref 11.5–15.5)
WBC: 4.4 10*3/uL (ref 4.0–10.5)

## 2018-01-09 LAB — LIPID PANEL
CHOLESTEROL: 164 mg/dL (ref 0–200)
HDL: 62.3 mg/dL (ref >39.00)
LDL CALC: 82 mg/dL (ref 0–99)
NONHDL: 101.7
Total CHOL/HDL Ratio: 3
Triglycerides: 99 mg/dL (ref 0.0–149.0)
VLDL: 19.8 mg/dL (ref 0.0–40.0)

## 2018-01-09 LAB — HEPATIC FUNCTION PANEL
ALBUMIN: 4.5 g/dL (ref 3.5–5.2)
ALT: 25 U/L (ref 0–35)
AST: 30 U/L (ref 0–37)
Alkaline Phosphatase: 74 U/L (ref 39–117)
BILIRUBIN DIRECT: 0.2 mg/dL (ref 0.0–0.3)
TOTAL PROTEIN: 7.4 g/dL (ref 6.0–8.3)
Total Bilirubin: 1.1 mg/dL (ref 0.2–1.2)

## 2018-01-09 LAB — BASIC METABOLIC PANEL
BUN: 18 mg/dL (ref 6–23)
CALCIUM: 9.9 mg/dL (ref 8.4–10.5)
CO2: 31 mEq/L (ref 19–32)
CREATININE: 0.82 mg/dL (ref 0.40–1.20)
Chloride: 102 mEq/L (ref 96–112)
GFR: 73.4 mL/min (ref >60.00)
Glucose, Bld: 108 mg/dL — ABNORMAL HIGH (ref 70–99)
Potassium: 4.4 mEq/L (ref 3.5–5.1)
Sodium: 140 mEq/L (ref 135–145)

## 2018-01-09 LAB — TSH: TSH: 2.92 u[IU]/mL (ref 0.35–4.50)

## 2018-01-10 ENCOUNTER — Other Ambulatory Visit: Payer: Self-pay | Admitting: Family Medicine

## 2018-01-10 DIAGNOSIS — I4892 Unspecified atrial flutter: Secondary | ICD-10-CM

## 2018-01-10 NOTE — Telephone Encounter (Signed)
Medication filled to pharmacy as requested.   

## 2018-01-12 ENCOUNTER — Encounter: Payer: Self-pay | Admitting: Family Medicine

## 2018-01-12 ENCOUNTER — Encounter

## 2018-01-12 ENCOUNTER — Other Ambulatory Visit: Payer: Self-pay

## 2018-01-12 ENCOUNTER — Ambulatory Visit (INDEPENDENT_AMBULATORY_CARE_PROVIDER_SITE_OTHER): Payer: Medicare HMO | Admitting: Family Medicine

## 2018-01-12 VITALS — BP 110/70 | HR 65 | Temp 98.0°F | Resp 16 | Ht 60.0 in | Wt 130.0 lb

## 2018-01-12 DIAGNOSIS — E785 Hyperlipidemia, unspecified: Secondary | ICD-10-CM

## 2018-01-12 DIAGNOSIS — K219 Gastro-esophageal reflux disease without esophagitis: Secondary | ICD-10-CM | POA: Diagnosis not present

## 2018-01-12 DIAGNOSIS — I4892 Unspecified atrial flutter: Secondary | ICD-10-CM | POA: Diagnosis not present

## 2018-01-12 DIAGNOSIS — Z0000 Encounter for general adult medical examination without abnormal findings: Secondary | ICD-10-CM

## 2018-01-12 MED ORDER — OMEPRAZOLE 40 MG PO CPDR: 40.0000 mg | DELAYED_RELEASE_CAPSULE | Freq: Every day | ORAL | 3 refills | Status: DC

## 2018-01-12 NOTE — Progress Notes (Signed)
   Subjective:    Patient ID: Chelsea Morgan, female    DOB: 1950-05-21, 67 y.o.   MRN: [id number]  HPI CPE- UTD on mammo, colonoscopy, immunizations.     Review of Systems Patient reports no vision/ hearing changes, adenopathy,fever, weight change, swallowing issues, chest pain, palpitations, edema, hemoptysis, dyspnea (rest/exertional/paroxysmal nocturnal), gastrointestinal bleeding (melena, rectal bleeding), abdominal pain, bowel changes, GU symptoms (dysuria, hematuria, incontinence), Gyn symptoms (abnormal  bleeding, pain),  syncope, focal weakness, memory loss, numbness & tingling, skin/hair/nail changes, abnormal bruising or bleeding, anxiety, or depression.   + recurrent hoarseness and cough- pt feels strongly this is due to GERD.  Mom died of esophageal cancer.    Objective:   Physical Exam General Appearance:    Alert, cooperative, no distress, appears stated age  Head:    Normocephalic, without obvious abnormality, atraumatic  Eyes:    PERRL, conjunctiva/corneas clear, EOM's intact, fundi    benign, both eyes  Ears:    Normal TM's and external ear canals, both ears  Nose:   Nares normal, septum midline, mucosa normal, no drainage    or sinus tenderness  Throat:   Lips, mucosa, and tongue normal; teeth and gums normal  Neck:   Supple, symmetrical, trachea midline, no adenopathy;    Thyroid: no enlargement/tenderness/nodules  Back:     Symmetric, no curvature, ROM normal, no CVA tenderness  Lungs:     Clear to auscultation bilaterally, respirations unlabored  Chest Wall:    No tenderness or deformity   Heart:    Regular rate and rhythm, S1 and S2 normal, no murmur, rub   or gallop  Breast Exam:    Deferred to mammo  Abdomen:     Soft, non-tender, bowel sounds active all four quadrants,    no masses, no organomegaly  Genitalia:    Deferred  Rectal:    Extremities:   Extremities normal, atraumatic, no cyanosis or edema  Pulses:   2+ and symmetric all extremities  Skin:   Skin  color, texture, turgor normal, no rashes or lesions  Lymph nodes:   Cervical, supraclavicular, and axillary nodes normal  Neurologic:   CNII-XII intact, normal strength, sensation and reflexes    throughout          Assessment & Plan:

## 2018-01-12 NOTE — Assessment & Plan Note (Signed)
Pt's PE WNL.  UTD on mammo, colonoscopy, immunizations.  Reviewed recent labs- things look great!  Anticipatory guidance provided.

## 2018-01-12 NOTE — Assessment & Plan Note (Signed)
Pt reports some muscle aches on 20mg  Lipitor.  Asking to decrease to 10mg  daily.  Based on recent labs, ok to start 10mg  daily.

## 2018-01-12 NOTE — Assessment & Plan Note (Signed)
Chronic problem.  Rate controlled on Coreg BID and she is on ASA 81mg  daily.  No longer seeing Dr Ladona Ridgel.

## 2018-01-12 NOTE — Assessment & Plan Note (Signed)
Deteriorated.  Start daily PPI and monitor for improvement in hoarseness and cough.  If no improvement will need ENT/GI work up.

## 2018-01-12 NOTE — Patient Instructions (Signed)
Follow up in 6 months to recheck BP and cholesterol Your labs look great!  Keep up the good work! Decrease the Atorvastatin to 10mg  daily (1/2 tab) START the Omeprazole once daily to improve reflux.  If hoarseness and cough don't improve, let me know! Call with any questions or concerns Happy Holidays!

## 2018-01-16 ENCOUNTER — Other Ambulatory Visit: Payer: Managed Care, Other (non HMO)

## 2018-01-17 ENCOUNTER — Other Ambulatory Visit: Payer: Self-pay | Admitting: Family Medicine

## 2018-01-17 DIAGNOSIS — L659 Nonscarring hair loss, unspecified: Secondary | ICD-10-CM

## 2018-01-19 ENCOUNTER — Encounter: Payer: Managed Care, Other (non HMO) | Admitting: Family Medicine

## 2018-04-22 ENCOUNTER — Other Ambulatory Visit: Payer: Self-pay | Admitting: Family Medicine

## 2018-05-16 DIAGNOSIS — R69 Illness, unspecified: Secondary | ICD-10-CM | POA: Diagnosis not present

## 2018-05-18 ENCOUNTER — Other Ambulatory Visit: Payer: Self-pay | Admitting: Family Medicine

## 2018-07-13 ENCOUNTER — Other Ambulatory Visit: Payer: Self-pay

## 2018-07-13 ENCOUNTER — Encounter: Payer: Self-pay | Admitting: Family Medicine

## 2018-07-13 ENCOUNTER — Ambulatory Visit (INDEPENDENT_AMBULATORY_CARE_PROVIDER_SITE_OTHER): Payer: Medicare HMO | Admitting: Family Medicine

## 2018-07-13 VITALS — BP 145/88 | HR 63 | Ht 61.0 in | Wt 130.0 lb

## 2018-07-13 DIAGNOSIS — M65342 Trigger finger, left ring finger: Secondary | ICD-10-CM

## 2018-07-13 DIAGNOSIS — I10 Essential (primary) hypertension: Secondary | ICD-10-CM | POA: Diagnosis not present

## 2018-07-13 DIAGNOSIS — E785 Hyperlipidemia, unspecified: Secondary | ICD-10-CM

## 2018-07-13 DIAGNOSIS — B351 Tinea unguium: Secondary | ICD-10-CM

## 2018-07-13 MED ORDER — CICLOPIROX 8 % EX SOLN: Freq: Every day | CUTANEOUS | 1 refills | Status: DC

## 2018-07-13 NOTE — Progress Notes (Signed)
Virtual Visit via Video   I connected with patient on 07/13/18 at  9:00 AM EDT by a video enabled telemedicine application and verified that I am speaking with the correct person using two identifiers.  Location patient: Home Location provider: [facility], Office Persons participating in the virtual visit: Patient, Provider, CMA (Jess B)  I discussed the limitations of evaluation and management by telemedicine and the availability of in person appointments. The patient expressed understanding and agreed to proceed.  Subjective:   HPI:   HTN- chronic problem, on Coreg 6.25 BID, HCTZ 25mg  daily.  BP is mildly elevated today, pt reports being anxious about this video visit and was drinking coffee at time of reading. Reports home BPs have been 'good'.  Denies CP, SOB, HAs, visual changes, edema.  Hyperlipidemia- chronic problem, on Lipitor 20mg  daily.  No abd pain, N/V.  Walking regularly.  Nail fungus- pt reports she has this yearly and the nail starts to lift away from the bed.  She is applying OTC medication w/o relief.  Would like prescription sent in.  Trigger finger- L 4th finger.  Pt has been icing and wearing finger splint.  Remains painful.  Pt has been to [facility].  ROS:   See pertinent positives and negatives per HPI.  Patient Active Problem List   Diagnosis Date Noted  . Hair loss 09/15/2016  . GERD (gastroesophageal reflux disease) 09/15/2016  . Physical exam 09/15/2016  . Paroxysmal atrial flutter (HCC) 10/31/2013    Class: Diagnosis of  . Essential hypertension, benign 01/12/2008  . Allergic rhinitis 01/12/2008  . Dyslipidemia 05/02/2007    Social History   Tobacco Use  . Smoking status: Never Smoker  . Smokeless tobacco: Never Used  Substance Use Topics  . Alcohol use: No    Alcohol/week: 0.0 standard drinks    Current Outpatient Medications:  .  aspirin 81 MG tablet, Take 81 mg by mouth daily., Disp: , Rfl:  .  atorvastatin (LIPITOR) 20 MG  tablet, TAKE 1 TABLET BY MOUTH ONCE DAILY, Disp: 90 tablet, Rfl: 0 .  BIOTIN 5000 PO, Take 1 tablet by mouth daily. , Disp: , Rfl:  .  carvedilol (COREG) 12.5 MG tablet, TAKE 1/2 (ONE-HALF) TABLET BY MOUTH TWICE DAILY, Disp: 90 tablet, Rfl: 1 .  Cholecalciferol (VITAMIN D) 2000 UNITS CAPS, Take 4,000-6,000 Units by mouth daily. , Disp: , Rfl:  .  EPINEPHrine (EPIPEN) 0.3 mg/0.3 mL DEVI, Inject 0.3 mg into the muscle once.  , Disp: , Rfl:  .  finasteride (PROSCAR) 5 MG tablet, TAKE 1/2 (ONE-HALF) TABLET BY MOUTH ONCE DAILY, Disp: 45 tablet, Rfl: 1 .  hydrochlorothiazide (HYDRODIURIL) 25 MG tablet, TAKE 1 TABLET BY MOUTH ONCE DAILY, Disp: 90 tablet, Rfl: 3 .  Magnesium 250 MG TABS, Take 1 tablet by mouth 2 (two) times daily., Disp: , Rfl:  .  mometasone (NASONEX) 50 MCG/ACT nasal spray, Place 2 sprays into the nose daily as needed (congestion)., Disp: 17 g, Rfl: 2 .  Multiple Vitamins-Minerals (ZINC PO), Take by mouth., Disp: , Rfl:  .  Omega-3 Fatty Acids (FISH OIL) 1360 MG CAPS, Take by mouth daily., Disp: , Rfl:  .  omeprazole (PRILOSEC) 40 MG capsule, Take 1 capsule by mouth once daily, Disp: 30 capsule, Rfl: 0 .  POTASSIUM PO, Take 1 capsule by mouth daily. OTC, Disp: , Rfl:   No Known Allergies  Objective:   BP (!) 145/88   Pulse 63   Ht 5\' 1"  (1.549 m)  Wt 130 lb (59 kg)   BMI 24.56 kg/m   AAOx3, NAD NCAT, EOMI No obvious CN deficits Coloring WNL Pt is able to speak clearly, coherently without shortness of breath or increased work of breathing.  Thought process is linear.  Mood is appropriate.   Assessment and Plan:   HTN- chronic problem.  BP is elevated today but pt is drinking coffee and admits to being nervous for the video visit, 'i've never done anything like this'.  Check labs.  No anticipated med changes.  Will follow.  Hyperlipidemia- chronic problem.  Tolerating statin w/o difficulty.  Check labs.  Adjust meds prn   Nail Fungus- pt reports recurrent problem.   Using OTC product w/o improvement.  Start Ciclopirox and monitor for improvement.  Trigger Finger- painful for pt.  Refer to hand specialist for evaluation and tx   Neena RhymesKatherine Kitrina Maurin, MD 07/13/2018

## 2018-07-13 NOTE — Progress Notes (Signed)
I have discussed the procedure for the virtual visit with the patient who has given consent to proceed with assessment and treatment.   Cortez Flippen L Hieu Herms, CMA     

## 2018-07-17 ENCOUNTER — Other Ambulatory Visit (INDEPENDENT_AMBULATORY_CARE_PROVIDER_SITE_OTHER): Payer: Medicare HMO

## 2018-07-17 DIAGNOSIS — E785 Hyperlipidemia, unspecified: Secondary | ICD-10-CM | POA: Diagnosis not present

## 2018-07-17 DIAGNOSIS — I10 Essential (primary) hypertension: Secondary | ICD-10-CM | POA: Diagnosis not present

## 2018-07-17 LAB — CBC WITH DIFFERENTIAL/PLATELET
Basophils Absolute: 0.1 10*3/uL (ref 0.0–0.1)
Basophils Relative: 1.1 % (ref 0.0–3.0)
Eosinophils Absolute: 0.3 10*3/uL (ref 0.0–0.7)
Eosinophils Relative: 5.1 % — ABNORMAL HIGH (ref 0.0–5.0)
HCT: 40.9 % (ref 36.0–46.0)
Hemoglobin: 13.4 g/dL (ref 12.0–15.0)
Lymphocytes Relative: 32.7 % (ref 12.0–46.0)
Lymphs Abs: 1.6 10*3/uL (ref 0.7–4.0)
MCHC: 32.9 g/dL (ref 30.0–36.0)
MCV: 87.4 fl (ref 78.0–100.0)
Monocytes Absolute: 0.4 10*3/uL (ref 0.1–1.0)
Monocytes Relative: 8.9 % (ref 3.0–12.0)
Neutro Abs: 2.6 10*3/uL (ref 1.4–7.7)
Neutrophils Relative %: 52.2 % (ref 43.0–77.0)
Platelets: 244 10*3/uL (ref 150.0–400.0)
RBC: 4.67 Mil/uL (ref 3.87–5.11)
RDW: 14.6 % (ref 11.5–15.5)
WBC: 5 10*3/uL (ref 4.0–10.5)

## 2018-07-17 LAB — LIPID PANEL
Cholesterol: 155 mg/dL (ref 0–200)
HDL: 53.8 mg/dL (ref >39.00)
LDL Cholesterol: 80 mg/dL (ref 0–99)
NonHDL: 101.06
Total CHOL/HDL Ratio: 3
Triglycerides: 107 mg/dL (ref 0.0–149.0)
VLDL: 21.4 mg/dL (ref 0.0–40.0)

## 2018-07-17 LAB — HEPATIC FUNCTION PANEL
ALT: 20 U/L (ref 0–35)
AST: 20 U/L (ref 0–37)
Albumin: 4.4 g/dL (ref 3.5–5.2)
Alkaline Phosphatase: 81 U/L (ref 39–117)
Bilirubin, Direct: 0.2 mg/dL (ref 0.0–0.3)
Total Bilirubin: 1.3 mg/dL — ABNORMAL HIGH (ref 0.2–1.2)
Total Protein: 6.7 g/dL (ref 6.0–8.3)

## 2018-07-17 LAB — TSH: TSH: 2.97 u[IU]/mL (ref 0.35–4.50)

## 2018-07-17 LAB — BASIC METABOLIC PANEL
BUN: 11 mg/dL (ref 6–23)
CO2: 30 mEq/L (ref 19–32)
Calcium: 9.7 mg/dL (ref 8.4–10.5)
Chloride: 102 mEq/L (ref 96–112)
Creatinine, Ser: 0.73 mg/dL (ref 0.40–1.20)
GFR: 78.85 mL/min (ref >60.00)
Glucose, Bld: 89 mg/dL (ref 70–99)
Potassium: 4.3 mEq/L (ref 3.5–5.1)
Sodium: 140 mEq/L (ref 135–145)

## 2018-07-18 ENCOUNTER — Encounter: Payer: Self-pay | Admitting: General Practice

## 2018-07-20 DIAGNOSIS — M65342 Trigger finger, left ring finger: Secondary | ICD-10-CM | POA: Diagnosis not present

## 2018-07-20 DIAGNOSIS — G5602 Carpal tunnel syndrome, left upper limb: Secondary | ICD-10-CM | POA: Diagnosis not present

## 2018-08-03 ENCOUNTER — Other Ambulatory Visit: Payer: Self-pay | Admitting: Family Medicine

## 2018-08-29 DIAGNOSIS — M65342 Trigger finger, left ring finger: Secondary | ICD-10-CM | POA: Diagnosis not present

## 2018-08-29 DIAGNOSIS — G5602 Carpal tunnel syndrome, left upper limb: Secondary | ICD-10-CM | POA: Diagnosis not present

## 2018-08-30 DIAGNOSIS — I8312 Varicose veins of left lower extremity with inflammation: Secondary | ICD-10-CM | POA: Diagnosis not present

## 2018-08-30 DIAGNOSIS — I8311 Varicose veins of right lower extremity with inflammation: Secondary | ICD-10-CM | POA: Diagnosis not present

## 2018-09-25 DIAGNOSIS — I8311 Varicose veins of right lower extremity with inflammation: Secondary | ICD-10-CM | POA: Diagnosis not present

## 2018-09-25 DIAGNOSIS — I8312 Varicose veins of left lower extremity with inflammation: Secondary | ICD-10-CM | POA: Diagnosis not present

## 2018-10-06 DIAGNOSIS — I8312 Varicose veins of left lower extremity with inflammation: Secondary | ICD-10-CM | POA: Diagnosis not present

## 2018-10-06 DIAGNOSIS — I8311 Varicose veins of right lower extremity with inflammation: Secondary | ICD-10-CM | POA: Diagnosis not present

## 2018-10-24 DIAGNOSIS — I8311 Varicose veins of right lower extremity with inflammation: Secondary | ICD-10-CM | POA: Diagnosis not present

## 2018-10-29 LAB — HM MAMMOGRAPHY: HM Mammogram: NORMAL (ref 0–4)

## 2018-11-06 DIAGNOSIS — Z1231 Encounter for screening mammogram for malignant neoplasm of breast: Secondary | ICD-10-CM | POA: Diagnosis not present

## 2018-11-07 DIAGNOSIS — I8312 Varicose veins of left lower extremity with inflammation: Secondary | ICD-10-CM | POA: Diagnosis not present

## 2018-11-15 ENCOUNTER — Other Ambulatory Visit: Payer: Self-pay | Admitting: Family Medicine

## 2018-11-15 DIAGNOSIS — L659 Nonscarring hair loss, unspecified: Secondary | ICD-10-CM

## 2018-11-15 DIAGNOSIS — I4892 Unspecified atrial flutter: Secondary | ICD-10-CM

## 2018-11-15 DIAGNOSIS — I10 Essential (primary) hypertension: Secondary | ICD-10-CM

## 2018-11-23 DIAGNOSIS — R69 Illness, unspecified: Secondary | ICD-10-CM | POA: Diagnosis not present

## 2018-11-28 DIAGNOSIS — M7981 Nontraumatic hematoma of soft tissue: Secondary | ICD-10-CM | POA: Diagnosis not present

## 2018-11-28 DIAGNOSIS — I8311 Varicose veins of right lower extremity with inflammation: Secondary | ICD-10-CM | POA: Diagnosis not present

## 2018-12-12 DIAGNOSIS — M7981 Nontraumatic hematoma of soft tissue: Secondary | ICD-10-CM | POA: Diagnosis not present

## 2018-12-12 DIAGNOSIS — I8312 Varicose veins of left lower extremity with inflammation: Secondary | ICD-10-CM | POA: Diagnosis not present

## 2018-12-20 DIAGNOSIS — R69 Illness, unspecified: Secondary | ICD-10-CM | POA: Diagnosis not present

## 2018-12-26 DIAGNOSIS — M7981 Nontraumatic hematoma of soft tissue: Secondary | ICD-10-CM | POA: Diagnosis not present

## 2018-12-26 DIAGNOSIS — I8311 Varicose veins of right lower extremity with inflammation: Secondary | ICD-10-CM | POA: Diagnosis not present

## 2019-01-16 DIAGNOSIS — M7981 Nontraumatic hematoma of soft tissue: Secondary | ICD-10-CM | POA: Diagnosis not present

## 2019-01-16 DIAGNOSIS — I8312 Varicose veins of left lower extremity with inflammation: Secondary | ICD-10-CM | POA: Diagnosis not present

## 2019-01-25 ENCOUNTER — Encounter: Payer: Medicare HMO | Admitting: Family Medicine

## 2019-01-29 ENCOUNTER — Other Ambulatory Visit: Payer: Self-pay

## 2019-01-29 ENCOUNTER — Encounter: Payer: Self-pay | Admitting: Family Medicine

## 2019-01-29 ENCOUNTER — Ambulatory Visit (INDEPENDENT_AMBULATORY_CARE_PROVIDER_SITE_OTHER): Payer: Medicare HMO | Admitting: Family Medicine

## 2019-01-29 VITALS — BP 126/86 | HR 79 | Temp 97.9°F | Resp 16 | Ht 61.0 in | Wt 126.4 lb

## 2019-01-29 DIAGNOSIS — I10 Essential (primary) hypertension: Secondary | ICD-10-CM | POA: Diagnosis not present

## 2019-01-29 DIAGNOSIS — I4892 Unspecified atrial flutter: Secondary | ICD-10-CM | POA: Diagnosis not present

## 2019-01-29 DIAGNOSIS — R490 Dysphonia: Secondary | ICD-10-CM

## 2019-01-29 DIAGNOSIS — Z0000 Encounter for general adult medical examination without abnormal findings: Secondary | ICD-10-CM | POA: Diagnosis not present

## 2019-01-29 DIAGNOSIS — E785 Hyperlipidemia, unspecified: Secondary | ICD-10-CM

## 2019-01-29 LAB — HEPATIC FUNCTION PANEL
ALT: 31 U/L (ref 0–35)
AST: 24 U/L (ref 0–37)
Albumin: 4 g/dL (ref 3.5–5.2)
Alkaline Phosphatase: 92 U/L (ref 39–117)
Bilirubin, Direct: 0.3 mg/dL (ref 0.0–0.3)
Total Bilirubin: 1.7 mg/dL — ABNORMAL HIGH (ref 0.2–1.2)
Total Protein: 6.8 g/dL (ref 6.0–8.3)

## 2019-01-29 LAB — CBC WITH DIFFERENTIAL/PLATELET
Basophils Absolute: 0.1 10*3/uL (ref 0.0–0.1)
Basophils Relative: 1 % (ref 0.0–3.0)
Eosinophils Absolute: 0.3 10*3/uL (ref 0.0–0.7)
Eosinophils Relative: 4.6 % (ref 0.0–5.0)
HCT: 44.3 % (ref 36.0–46.0)
Hemoglobin: 14.6 g/dL (ref 12.0–15.0)
Lymphocytes Relative: 28.9 % (ref 12.0–46.0)
Lymphs Abs: 1.6 10*3/uL (ref 0.7–4.0)
MCHC: 32.9 g/dL (ref 30.0–36.0)
MCV: 89.9 fl (ref 78.0–100.0)
Monocytes Absolute: 0.4 10*3/uL (ref 0.1–1.0)
Monocytes Relative: 7.7 % (ref 3.0–12.0)
Neutro Abs: 3.3 10*3/uL (ref 1.4–7.7)
Neutrophils Relative %: 57.8 % (ref 43.0–77.0)
Platelets: 281 10*3/uL (ref 150.0–400.0)
RBC: 4.93 Mil/uL (ref 3.87–5.11)
RDW: 14.3 % (ref 11.5–15.5)
WBC: 5.7 10*3/uL (ref 4.0–10.5)

## 2019-01-29 LAB — LIPID PANEL
Cholesterol: 166 mg/dL (ref 0–200)
HDL: 55.8 mg/dL (ref >39.00)
LDL Cholesterol: 92 mg/dL (ref 0–99)
NonHDL: 110.26
Total CHOL/HDL Ratio: 3
Triglycerides: 92 mg/dL (ref 0.0–149.0)
VLDL: 18.4 mg/dL (ref 0.0–40.0)

## 2019-01-29 LAB — BASIC METABOLIC PANEL
BUN: 13 mg/dL (ref 6–23)
CO2: 30 mEq/L (ref 19–32)
Calcium: 9.9 mg/dL (ref 8.4–10.5)
Chloride: 101 mEq/L (ref 96–112)
Creatinine, Ser: 0.72 mg/dL (ref 0.40–1.20)
GFR: 79.99 mL/min (ref >60.00)
Glucose, Bld: 91 mg/dL (ref 70–99)
Potassium: 4.2 mEq/L (ref 3.5–5.1)
Sodium: 140 mEq/L (ref 135–145)

## 2019-01-29 LAB — TSH: TSH: 2.17 u[IU]/mL (ref 0.35–4.50)

## 2019-01-29 NOTE — Progress Notes (Signed)
This visit occurred during the SARS-CoV-2 public health emergency.  Safety protocols were in place, including screening questions prior to the visit, additional usage of staff PPE, and extensive cleaning of exam room while observing appropriate contact time as indicated for disinfecting solutions.  

## 2019-01-29 NOTE — Assessment & Plan Note (Signed)
Ongoing issue for pt.  Has seen both ENT and someone at [facility].  She reports sxs are worsening.  Will refer for 2nd opinion

## 2019-01-29 NOTE — Assessment & Plan Note (Signed)
Well controlled today.  Asymptomatic.  Check labs.  No anticipated med changes.  Will follow. 

## 2019-01-29 NOTE — Progress Notes (Signed)
   Subjective:    Patient ID: Chelsea Morgan, female    DOB: 01-22-1951, 68 y.o.   MRN: [id number]  HPI Here today for CPE and MWV.  Risk Factors: Hyperlipidemia- chronic problem, on Lipitor 20mg  daily HTN- chronic problem, on Coreg 6.25mg  BID, HCTZ 25mg  daily w/ good control PAF- on ASA daily and rate controlled on Coreg.  S/p ablation Physical Activity: walking 3x/week Fall Risk: low risk Depression: denies Hearing: normal to conversational tones and whispered voice at 6 ft ADL's: independent Cognitive: normal linear thought process, memory and attention intact Home Safety: safe at home, lives w/ husband Height, Weight, BMI, Visual Acuity: see vitals, vision corrected to 20/20 w/ glasses Counseling: UTD on colonoscopy, mammo, immunizations. Labs Ordered: See A&P Care Plan: See A&P   Patient Care Team    Relationship Specialty Notifications Start End  Midge Minium, MD PCP - General Family Medicine  09/15/16   Elza Rafter, MD Consulting Physician Family Medicine  01/29/19   Iran Planas, MD Consulting Physician Orthopedic Surgery  01/29/19   Jerelyn Charles, MD Consulting Physician Obstetrics  01/29/19       Review of Systems Patient reports no vision/ hearing changes, adenopathy,fever, weight change, swallowing issues, chest pain, palpitations, edema, persistant/recurrent cough, hemoptysis, dyspnea (rest/exertional/paroxysmal nocturnal), gastrointestinal bleeding (melena, rectal bleeding), abdominal pain, significant heartburn, bowel changes, GU symptoms (dysuria, hematuria, incontinence), Gyn symptoms (abnormal  bleeding, pain),  syncope, focal weakness, memory loss, skin/hair/nail changes, abnormal bruising or bleeding, anxiety, or depression.   + recurrent hoarseness- pt is concerned b/c mom had esophageal cancer.  Has been to ENT and [facility] w/o answers.    Bilateral hand numbness- sees Dr Caralyn Guile    Objective:   Physical Exam General Appearance:    Alert,  cooperative, no distress, appears stated age  Head:    Normocephalic, without obvious abnormality, atraumatic  Eyes:    PERRL, conjunctiva/corneas clear, EOM's intact, fundi    benign, both eyes  Ears:    Normal TM's and external ear canals, both ears  Nose:   Deferred due to COVID  Throat:   Neck:   Supple, symmetrical, trachea midline, no adenopathy;    Thyroid: no enlargement/tenderness/nodules  Back:     Symmetric, no curvature, ROM normal, no CVA tenderness  Lungs:     Clear to auscultation bilaterally, respirations unlabored  Chest Wall:    No tenderness or deformity   Heart:    Regular rate and rhythm, S1 and S2 normal, no murmur, rub   or gallop  Breast Exam:    Deferred to GYN  Abdomen:     Soft, non-tender, bowel sounds active all four quadrants,    no masses, no organomegaly  Genitalia:    Deferred to GYN  Rectal:    Extremities:   Extremities normal, atraumatic, no cyanosis or edema  Pulses:   2+ and symmetric all extremities  Skin:   Skin color, texture, turgor normal, no rashes or lesions  Lymph nodes:   Cervical, supraclavicular, and axillary nodes normal  Neurologic:   CNII-XII intact, normal strength, sensation and reflexes    throughout          Assessment & Plan:

## 2019-01-29 NOTE — Assessment & Plan Note (Signed)
Chronic problem.  Tolerating statin w/o difficulty.  Applauded her efforts at diet and exercise.  Check labs.  Adjust meds prn  

## 2019-01-29 NOTE — Assessment & Plan Note (Signed)
Pt's PE WNL.  UTD on mammo, colonoscopy, immunizations.  Check labs.  Anticipatory guidance provided.  

## 2019-01-29 NOTE — Patient Instructions (Addendum)
Follow up in 6 months to recheck BP and cholesterol We'll notify you of your lab results and make any changes if needed Continue to work on healthy diet and regular exercise- you look great! We'll call you with your ENT appt regarding the hoarseness Call with any questions or concerns Stay Safe!  Stay Healthy! Happy Thanksgiving!!   Preventive Care 21 Years and Older, Female Preventive care refers to lifestyle choices and visits with your health care provider that can promote health and wellness. This includes:  A yearly physical exam. This is also called an annual well check.  Regular dental and eye exams.  Immunizations.  Screening for certain conditions.  Healthy lifestyle choices, such as diet and exercise. What can I expect for my preventive care visit? Physical exam Your health care provider will check:  Height and weight. These may be used to calculate body mass index (BMI), which is a measurement that tells if you are at a healthy weight.  Heart rate and blood pressure.  Your skin for abnormal spots. Counseling Your health care provider may ask you questions about:  Alcohol, tobacco, and drug use.  Emotional well-being.  Home and relationship well-being.  Sexual activity.  Eating habits.  History of falls.  Memory and ability to understand (cognition).  Work and work Statistician.  Pregnancy and menstrual history. What immunizations do I need?  Influenza (flu) vaccine  This is recommended every year. Tetanus, diphtheria, and pertussis (Tdap) vaccine  You may need a Td booster every 10 years. Varicella (chickenpox) vaccine  You may need this vaccine if you have not already been vaccinated. Zoster (shingles) vaccine  You may need this after age 7. Pneumococcal conjugate (PCV13) vaccine  One dose is recommended after age 17. Pneumococcal polysaccharide (PPSV23) vaccine  One dose is recommended after age 27. Measles, mumps, and rubella (MMR)  vaccine  You may need at least one dose of MMR if you were born in 1957 or later. You may also need a second dose. Meningococcal conjugate (MenACWY) vaccine  You may need this if you have certain conditions. Hepatitis A vaccine  You may need this if you have certain conditions or if you travel or work in places where you may be exposed to hepatitis A. Hepatitis B vaccine  You may need this if you have certain conditions or if you travel or work in places where you may be exposed to hepatitis B. Haemophilus influenzae type b (Hib) vaccine  You may need this if you have certain conditions. You may receive vaccines as individual doses or as more than one vaccine together in one shot (combination vaccines). Talk with your health care provider about the risks and benefits of combination vaccines. What tests do I need? Blood tests  Lipid and cholesterol levels. These may be checked every 5 years, or more frequently depending on your overall health.  Hepatitis C test.  Hepatitis B test. Screening  Lung cancer screening. You may have this screening every year starting at age 35 if you have a 30-pack-year history of smoking and currently smoke or have quit within the past 15 years.  Colorectal cancer screening. All adults should have this screening starting at age 82 and continuing until age 37. Your health care provider may recommend screening at age 70 if you are at increased risk. You will have tests every 1-10 years, depending on your results and the type of screening test.  Diabetes screening. This is done by checking your blood sugar (glucose) after  you have not eaten for a while (fasting). You may have this done every 1-3 years.  Mammogram. This may be done every 1-2 years. Talk with your health care provider about how often you should have regular mammograms.  BRCA-related cancer screening. This may be done if you have a family history of breast, ovarian, tubal, or peritoneal  cancers. Other tests  Sexually transmitted disease (STD) testing.  Bone density scan. This is done to screen for osteoporosis. You may have this done starting at age 41. Follow these instructions at home: Eating and drinking  Eat a diet that includes fresh fruits and vegetables, whole grains, lean protein, and low-fat dairy products. Limit your intake of foods with high amounts of sugar, saturated fats, and salt.  Take vitamin and mineral supplements as recommended by your health care provider.  Do not drink alcohol if your health care provider tells you not to drink.  If you drink alcohol: ? Limit how much you have to 0-1 drink a day. ? Be aware of how much alcohol is in your drink. In the [place] one drink equals one 12 oz bottle of beer (355 mL), one 5 oz glass of wine (148 mL), or one 1 oz glass of hard liquor (44 mL). Lifestyle  Take daily care of your teeth and gums.  Stay active. Exercise for at least 30 minutes on 5 or more days each week.  Do not use any products that contain nicotine or tobacco, such as cigarettes, e-cigarettes, and chewing tobacco. If you need help quitting, ask your health care provider.  If you are sexually active, practice safe sex. Use a condom or other form of protection in order to prevent STIs (sexually transmitted infections).  Talk with your health care provider about taking a low-dose aspirin or statin. What's next?  Go to your health care provider once a year for a well check visit.  Ask your health care provider how often you should have your eyes and teeth checked.  Stay up to date on all vaccines. This information is not intended to replace advice given to you by your health care provider. Make sure you discuss any questions you have with your health care provider. Document Released: 03/21/2015 Document Revised: 02/16/2018 Document Reviewed: 02/16/2018 Elsevier Patient Education  2020 [employer].

## 2019-01-29 NOTE — Assessment & Plan Note (Signed)
S/p ablation.  Currently asymptomatic.  On ASA and beta blocker

## 2019-01-30 ENCOUNTER — Encounter: Payer: Self-pay | Admitting: General Practice

## 2019-02-12 DIAGNOSIS — J383 Other diseases of vocal cords: Secondary | ICD-10-CM | POA: Diagnosis not present

## 2019-02-12 DIAGNOSIS — R490 Dysphonia: Secondary | ICD-10-CM | POA: Diagnosis not present

## 2019-02-14 DIAGNOSIS — I8312 Varicose veins of left lower extremity with inflammation: Secondary | ICD-10-CM | POA: Diagnosis not present

## 2019-02-27 DIAGNOSIS — I8312 Varicose veins of left lower extremity with inflammation: Secondary | ICD-10-CM | POA: Diagnosis not present

## 2019-03-05 ENCOUNTER — Other Ambulatory Visit: Payer: Self-pay | Admitting: Family Medicine

## 2019-03-05 DIAGNOSIS — L659 Nonscarring hair loss, unspecified: Secondary | ICD-10-CM

## 2019-03-05 DIAGNOSIS — I10 Essential (primary) hypertension: Secondary | ICD-10-CM

## 2019-03-05 DIAGNOSIS — I4892 Unspecified atrial flutter: Secondary | ICD-10-CM

## 2019-03-19 DIAGNOSIS — R490 Dysphonia: Secondary | ICD-10-CM | POA: Diagnosis not present

## 2019-03-19 DIAGNOSIS — J387 Other diseases of larynx: Secondary | ICD-10-CM | POA: Diagnosis not present

## 2019-03-19 DIAGNOSIS — J385 Laryngeal spasm: Secondary | ICD-10-CM | POA: Diagnosis not present

## 2019-03-19 DIAGNOSIS — J383 Other diseases of vocal cords: Secondary | ICD-10-CM | POA: Diagnosis not present

## 2019-03-19 DIAGNOSIS — J384 Edema of larynx: Secondary | ICD-10-CM | POA: Diagnosis not present

## 2019-04-02 DIAGNOSIS — R490 Dysphonia: Secondary | ICD-10-CM | POA: Diagnosis not present

## 2019-04-02 DIAGNOSIS — J383 Other diseases of vocal cords: Secondary | ICD-10-CM | POA: Diagnosis not present

## 2019-04-02 DIAGNOSIS — Q313 Laryngocele: Secondary | ICD-10-CM | POA: Diagnosis not present

## 2019-04-03 ENCOUNTER — Ambulatory Visit: Payer: Medicare HMO

## 2019-04-12 ENCOUNTER — Ambulatory Visit: Payer: Medicare HMO | Attending: Internal Medicine

## 2019-04-12 DIAGNOSIS — Z23 Encounter for immunization: Secondary | ICD-10-CM | POA: Insufficient documentation

## 2019-04-12 NOTE — Progress Notes (Signed)
   Covid-19 Vaccination Clinic  Name:  Chelsea Morgan    MRN: [id number] DOB: Mar 19, 1950  04/12/2019  [name] was observed post Covid-19 immunization for 15 minutes without incidence. She was provided with Vaccine Information Sheet and instruction to access the V-Safe system.   [name] was instructed to call 911 with any severe reactions post vaccine: Marland Kitchen Difficulty breathing  . Swelling of your face and throat  . A fast heartbeat  . A bad rash all over your body  . Dizziness and weakness    Immunizations Administered    Name Date Dose VIS Date Route   Pfizer COVID-19 Vaccine 04/12/2019  2:05 PM 0.3 mL 02/16/2019 Intramuscular   Manufacturer: [employer], [employer]   Lot: [id number]   NDC: [address]-7

## 2019-04-24 ENCOUNTER — Ambulatory Visit: Payer: Medicare HMO

## 2019-05-07 DIAGNOSIS — J385 Laryngeal spasm: Secondary | ICD-10-CM | POA: Diagnosis not present

## 2019-05-07 DIAGNOSIS — R490 Dysphonia: Secondary | ICD-10-CM | POA: Diagnosis not present

## 2019-05-08 ENCOUNTER — Ambulatory Visit: Payer: Medicare HMO | Attending: Internal Medicine

## 2019-05-08 DIAGNOSIS — Z23 Encounter for immunization: Secondary | ICD-10-CM | POA: Insufficient documentation

## 2019-05-08 NOTE — Progress Notes (Signed)
   Covid-19 Vaccination Clinic  Name:  Chelsea Morgan    MRN: [id number] DOB: 1951-03-01  05/08/2019  Chelsea Morgan was observed post Covid-19 immunization for 15 minutes without incident. She was provided with Vaccine Information Sheet and instruction to access the V-Safe system.   Chelsea Morgan was instructed to call 911 with any severe reactions post vaccine: Marland Kitchen Difficulty breathing  . Swelling of face and throat  . A fast heartbeat  . A bad rash all over body  . Dizziness and weakness   Immunizations Administered    Name Date Dose VIS Date Route   Pfizer COVID-19 Vaccine 05/08/2019  9:49 AM 0.3 mL 02/16/2019 Intramuscular   Manufacturer: [employer], [employer]   Lot: [id number]   NDC: [address]-7

## 2019-06-04 DIAGNOSIS — J383 Other diseases of vocal cords: Secondary | ICD-10-CM | POA: Diagnosis not present

## 2019-06-04 DIAGNOSIS — Q313 Laryngocele: Secondary | ICD-10-CM | POA: Diagnosis not present

## 2019-06-04 DIAGNOSIS — J387 Other diseases of larynx: Secondary | ICD-10-CM | POA: Diagnosis not present

## 2019-06-04 DIAGNOSIS — R490 Dysphonia: Secondary | ICD-10-CM | POA: Diagnosis not present

## 2019-06-04 DIAGNOSIS — J384 Edema of larynx: Secondary | ICD-10-CM | POA: Diagnosis not present

## 2019-06-18 DIAGNOSIS — R69 Illness, unspecified: Secondary | ICD-10-CM | POA: Diagnosis not present

## 2019-07-05 DIAGNOSIS — Z01810 Encounter for preprocedural cardiovascular examination: Secondary | ICD-10-CM | POA: Diagnosis not present

## 2019-07-05 DIAGNOSIS — I4891 Unspecified atrial fibrillation: Secondary | ICD-10-CM | POA: Diagnosis not present

## 2019-07-12 DIAGNOSIS — R490 Dysphonia: Secondary | ICD-10-CM | POA: Diagnosis not present

## 2019-07-12 DIAGNOSIS — I4891 Unspecified atrial fibrillation: Secondary | ICD-10-CM | POA: Diagnosis not present

## 2019-07-12 DIAGNOSIS — J383 Other diseases of vocal cords: Secondary | ICD-10-CM | POA: Diagnosis not present

## 2019-07-12 DIAGNOSIS — Q313 Laryngocele: Secondary | ICD-10-CM | POA: Diagnosis not present

## 2019-07-30 ENCOUNTER — Encounter: Payer: Self-pay | Admitting: Family Medicine

## 2019-07-30 ENCOUNTER — Ambulatory Visit (INDEPENDENT_AMBULATORY_CARE_PROVIDER_SITE_OTHER): Payer: Medicare HMO | Admitting: Family Medicine

## 2019-07-30 ENCOUNTER — Other Ambulatory Visit: Payer: Self-pay

## 2019-07-30 VITALS — BP 128/82 | HR 76 | Temp 97.8°F | Resp 16 | Ht 61.0 in | Wt 129.1 lb

## 2019-07-30 DIAGNOSIS — E859 Amyloidosis, unspecified: Secondary | ICD-10-CM | POA: Insufficient documentation

## 2019-07-30 DIAGNOSIS — I4891 Unspecified atrial fibrillation: Secondary | ICD-10-CM

## 2019-07-30 DIAGNOSIS — I480 Paroxysmal atrial fibrillation: Secondary | ICD-10-CM | POA: Diagnosis not present

## 2019-07-30 DIAGNOSIS — I10 Essential (primary) hypertension: Secondary | ICD-10-CM

## 2019-07-30 DIAGNOSIS — E785 Hyperlipidemia, unspecified: Secondary | ICD-10-CM | POA: Diagnosis not present

## 2019-07-30 LAB — CBC WITH DIFFERENTIAL/PLATELET
Basophils Absolute: 0 10*3/uL (ref 0.0–0.1)
Basophils Relative: 0.9 % (ref 0.0–3.0)
Eosinophils Absolute: 0.1 10*3/uL (ref 0.0–0.7)
Eosinophils Relative: 2.6 % (ref 0.0–5.0)
HCT: 45.2 % (ref 36.0–46.0)
Hemoglobin: 15 g/dL (ref 12.0–15.0)
Lymphocytes Relative: 24.1 % (ref 12.0–46.0)
Lymphs Abs: 1.2 10*3/uL (ref 0.7–4.0)
MCHC: 33.2 g/dL (ref 30.0–36.0)
MCV: 91.2 fl (ref 78.0–100.0)
Monocytes Absolute: 0.4 10*3/uL (ref 0.1–1.0)
Monocytes Relative: 8.6 % (ref 3.0–12.0)
Neutro Abs: 3.1 10*3/uL (ref 1.4–7.7)
Neutrophils Relative %: 63.8 % (ref 43.0–77.0)
Platelets: 323 10*3/uL (ref 150.0–400.0)
RBC: 4.95 Mil/uL (ref 3.87–5.11)
RDW: 13.8 % (ref 11.5–15.5)
WBC: 4.8 10*3/uL (ref 4.0–10.5)

## 2019-07-30 LAB — HEPATIC FUNCTION PANEL
ALT: 18 U/L (ref 0–35)
AST: 14 U/L (ref 0–37)
Albumin: 4.4 g/dL (ref 3.5–5.2)
Alkaline Phosphatase: 88 U/L (ref 39–117)
Bilirubin, Direct: 0.3 mg/dL (ref 0.0–0.3)
Total Bilirubin: 1.6 mg/dL — ABNORMAL HIGH (ref 0.2–1.2)
Total Protein: 6.8 g/dL (ref 6.0–8.3)

## 2019-07-30 LAB — LIPID PANEL
Cholesterol: 162 mg/dL (ref 0–200)
HDL: 51.7 mg/dL (ref >39.00)
LDL Cholesterol: 96 mg/dL (ref 0–99)
NonHDL: 110.47
Total CHOL/HDL Ratio: 3
Triglycerides: 74 mg/dL (ref 0.0–149.0)
VLDL: 14.8 mg/dL (ref 0.0–40.0)

## 2019-07-30 LAB — BASIC METABOLIC PANEL
BUN: 13 mg/dL (ref 6–23)
CO2: 30 mEq/L (ref 19–32)
Calcium: 9.9 mg/dL (ref 8.4–10.5)
Chloride: 101 mEq/L (ref 96–112)
Creatinine, Ser: 0.69 mg/dL (ref 0.40–1.20)
GFR: 83.9 mL/min (ref >60.00)
Glucose, Bld: 95 mg/dL (ref 70–99)
Potassium: 4 mEq/L (ref 3.5–5.1)
Sodium: 139 mEq/L (ref 135–145)

## 2019-07-30 LAB — TSH: TSH: 1.41 u[IU]/mL (ref 0.35–4.50)

## 2019-07-30 NOTE — Patient Instructions (Signed)
Schedule your complete physical in 6 months (but I am here if you need me sooner!) We'll notify you of your lab results and make any changes if needed We'll call you with your Cardiology appt No med changes at this time You are allowed to feel ALL the feelings right now.  Don't minimize them! Call with any questions or concerns Hang in there!!!

## 2019-07-30 NOTE — Assessment & Plan Note (Signed)
New.  Pt's pre-op EKG showed Afib despite pt being asymptomatic.  At time of surgery was NSR.  Today in office, rhythm is irregular but rate controlled on Coreg.  On ASA daily.  Will refer back to Cards for complete evaluation and tx prn.

## 2019-07-30 NOTE — Assessment & Plan Note (Signed)
Chronic problem.  Tolerating statin w/o difficulty.  Check labs.  Adjust meds prn  

## 2019-07-30 NOTE — Assessment & Plan Note (Signed)
Chronic problem.  Currently well controlled.  Asymptomatic.  Check labs.  No anticipated med changes.  Will follow. 

## 2019-07-30 NOTE — Assessment & Plan Note (Signed)
New.  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  ENT referred her to Hematology and she has appt upcoming on 6/7.  She is understandably anxious about this given what she has read online.  Discussed that her emotions are completely appropriate given the situation.  Will follow closely and assist as able.

## 2019-07-30 NOTE — Progress Notes (Signed)
   Subjective:    Patient ID: Chelsea Morgan, female    DOB: [birth year], 69 y.o.   MRN: [id number]  HPI Afib- noted on prep-op EKG done 4/29 at [facility].  Has hx of Aflutter and had an ablation done 2015.  On Coreg 6.25mg  BID and ASA 81mg  daily  Hyperlipidemia- chronic problem, on Lipitor 20mg  daily and Fish Oil 1360mg .  Denies abd pain, N/V.  HTN- chronic problem, on Coreg BID and HCTZ 25mg  daily w/ good control.  Denies CP, SOB, HAs, visual changes, edema.  Amyloidosis- when ENT sent fragment of false vocal cord it stained + for amyloid.  Pt has done some online research and she is fearful.   Review of Systems For ROS see HPI   This visit occurred during the SARS-CoV-2 public health emergency.  Safety protocols were in place, including screening questions prior to the visit, additional usage of staff PPE, and extensive cleaning of exam room while observing appropriate contact time as indicated for disinfecting solutions.       Objective:   Physical Exam Vitals reviewed.  Constitutional:      General: She is not in acute distress.    Appearance: Normal appearance. She is well-developed.  HENT:     Head: Normocephalic and atraumatic.  Eyes:     Conjunctiva/sclera: Conjunctivae normal.     Pupils: Pupils are equal, round, and reactive to light.  Neck:     Thyroid: No thyromegaly.  Cardiovascular:     Rate and Rhythm: Normal rate. Rhythm irregular.     Heart sounds: Normal heart sounds. No murmur.  Pulmonary:     Effort: Pulmonary effort is normal. No respiratory distress.     Breath sounds: Normal breath sounds.  Abdominal:     General: There is no distension.     Palpations: Abdomen is soft.     Tenderness: There is no abdominal tenderness.  Musculoskeletal:     Cervical back: Normal range of motion and neck supple.  Lymphadenopathy:     Cervical: No cervical adenopathy.  Skin:    General: Skin is warm and dry.  Neurological:     Mental Status: She is alert and oriented  to person, place, and time.  Psychiatric:        Behavior: Behavior normal.           Assessment & Plan:

## 2019-07-31 ENCOUNTER — Encounter: Payer: Self-pay | Admitting: General Practice

## 2019-08-13 DIAGNOSIS — Q313 Laryngocele: Secondary | ICD-10-CM | POA: Diagnosis not present

## 2019-08-13 DIAGNOSIS — J383 Other diseases of vocal cords: Secondary | ICD-10-CM | POA: Diagnosis not present

## 2019-08-13 DIAGNOSIS — J384 Edema of larynx: Secondary | ICD-10-CM | POA: Diagnosis not present

## 2019-08-13 DIAGNOSIS — Z48813 Encounter for surgical aftercare following surgery on the respiratory system: Secondary | ICD-10-CM | POA: Diagnosis not present

## 2019-08-21 ENCOUNTER — Ambulatory Visit: Payer: Medicare HMO | Admitting: Internal Medicine

## 2019-08-21 ENCOUNTER — Other Ambulatory Visit: Payer: Self-pay

## 2019-08-21 ENCOUNTER — Encounter: Payer: Self-pay | Admitting: Internal Medicine

## 2019-08-21 VITALS — BP 132/70 | HR 90 | Ht 61.0 in | Wt 130.6 lb

## 2019-08-21 DIAGNOSIS — I480 Paroxysmal atrial fibrillation: Secondary | ICD-10-CM

## 2019-08-21 DIAGNOSIS — I10 Essential (primary) hypertension: Secondary | ICD-10-CM

## 2019-08-21 MED ORDER — RIVAROXABAN 20 MG PO TABS
20.0000 mg | ORAL_TABLET | Freq: Every day | ORAL | 11 refills | Status: DC
Start: 2019-08-21 — End: 2020-02-19

## 2019-08-21 NOTE — Patient Instructions (Addendum)
Medication Instructions:  Your physician has recommended you make the following change in your medication:   1.  STOP taking aspirin  2.  Start taking Xarelto 20 mg-  Take one tablet by mouth daily with your largest meal   Labwork: None ordered.  Testing/Procedures: None ordered.  Follow-Up: Your physician wants you to follow-up in: 6 months with Dr. Lovena Le.     February 19, 2020 at 9:30 am at the [facility] office  Any Other Special Instructions Will Be Listed Below (If Applicable).  If you need a refill on your cardiac medications before your next appointment, please call your pharmacy.   Rivaroxaban oral tablets What is this medicine? RIVAROXABAN (ri va ROX a ban) is an anticoagulant (blood thinner). It is used to treat blood clots in the lungs or in the veins. It is also used to prevent blood clots in the lungs or in the veins. It is also used to lower the chance of stroke in people with a medical condition called atrial fibrillation. This medicine may be used for other purposes; ask your health care provider or pharmacist if you have questions. COMMON BRAND NAME(S): Xarelto, Xarelto Starter Pack What should I tell my health care provider before I take this medicine? They need to know if you have any of these conditions:  antiphospholipid antibody syndrome  artificial heart valve  bleeding disorders  bleeding in the brain  blood in your stools (black or tarry stools) or if you have blood in your vomit  history of blood clots  history of stomach bleeding  kidney disease  liver disease  low blood counts, like low white cell, platelet, or red cell counts  recent or planned spinal or epidural procedure  take medicines that treat or prevent blood clots  an unusual or allergic reaction to rivaroxaban, other medicines, foods, dyes, or preservatives  pregnant or trying to get pregnant  breast-feeding How should I use this medicine? Take this medicine by mouth  with a glass of water. Follow the directions on the prescription label. Take your medicine at regular intervals. Do not take it more often than directed. Do not stop taking except on your doctor's advice. Stopping this medicine may increase your risk of a blood clot. Be sure to refill your prescription before you run out of medicine. If you are taking this medicine after hip or knee replacement surgery, take it with or without food. If you are taking this medicine for atrial fibrillation, take it with your evening meal. If you are taking this medicine to treat blood clots, take it with food at the same time each day. If you are unable to swallow your tablet, you may crush the tablet and mix it in applesauce. Then, immediately eat the applesauce. You should eat more food right after you eat the applesauce containing the crushed tablet. Talk to your pediatrician regarding the use of this medicine in children. Special care may be needed. Overdosage: If you think you have taken too much of this medicine contact a poison control center or emergency room at once. NOTE: This medicine is only for you. Do not share this medicine with others. What if I miss a dose? If you take your medicine once a day and miss a dose, take the missed dose as soon as you remember. If it is almost time for your next dose, take only that dose. Do not take double or extra doses. If you take your medicine twice a day and miss a  dose, take the missed dose immediately. In this instance, 2 tablets may be taken at the same time. The next day you should take 1 tablet twice a day as directed. What may interact with this medicine? Do not take this medicine with any of the following medications:  defibrotide This medicine may also interact with the following medications:  aspirin and aspirin-like medicines  certain antibiotics like erythromycin, azithromycin, and clarithromycin  certain medicines for fungal infections like ketoconazole  and itraconazole  certain medicines for irregular heart beat like amiodarone, quinidine, dronedarone  certain medicines for seizures like carbamazepine, phenytoin  certain medicines that treat or prevent blood clots like warfarin, enoxaparin, and dalteparin  conivaptan  felodipine  indinavir  lopinavir; ritonavir  NSAIDS, medicines for pain and inflammation, like ibuprofen or naproxen  ranolazine  rifampin  ritonavir  SNRIs, medicines for depression, like desvenlafaxine, duloxetine, levomilnacipran, venlafaxine  SSRIs, medicines for depression, like citalopram, escitalopram, fluoxetine, fluvoxamine, paroxetine, sertraline  St. John's wort  verapamil This list may not describe all possible interactions. Give your health care provider a list of all the medicines, herbs, non-prescription drugs, or dietary supplements you use. Also tell them if you smoke, drink alcohol, or use illegal drugs. Some items may interact with your medicine. What should I watch for while using this medicine? Visit your healthcare professional for regular checks on your progress. You may need blood work done while you are taking this medicine. Your condition will be monitored carefully while you are receiving this medicine. It is important not to miss any appointments. Avoid sports and activities that might cause injury while you are using this medicine. Severe falls or injuries can cause unseen bleeding. Be careful when using sharp tools or knives. Consider using an Neurosurgeon. Take special care brushing or flossing your teeth. Report any injuries, bruising, or red spots on the skin to your healthcare professional. If you are going to need surgery or other procedure, tell your healthcare professional that you are taking this medicine. Wear a medical ID bracelet or chain. Carry a card that describes your disease and details of your medicine and dosage times. What side effects may I notice from receiving  this medicine? Side effects that you should report to your doctor or health care professional as soon as possible:  allergic reactions like skin rash, itching or hives, swelling of the face, lips, or tongue  back pain  redness, blistering, peeling or loosening of the skin, including inside the mouth  signs and symptoms of bleeding such as bloody or black, tarry stools; red or dark-brown urine; spitting up blood or brown material that looks like coffee grounds; red spots on the skin; unusual bruising or bleeding from the eye, gums, or nose  signs and symptoms of a blood clot such as chest pain; shortness of breath; pain, swelling, or warmth in the leg  signs and symptoms of a stroke such as changes in vision; confusion; trouble speaking or understanding; severe headaches; sudden numbness or weakness of the face, arm or leg; trouble walking; dizziness; loss of coordination Side effects that usually do not require medical attention (report to your doctor or health care professional if they continue or are bothersome):  dizziness  muscle pain This list may not describe all possible side effects. Call your doctor for medical advice about side effects. You may report side effects to FDA at 1-800-FDA-1088. Where should I keep my medicine? Keep out of the reach of children. Store at room temperature between 15  and 30 degrees C (59 and 86 degrees F). Throw away any unused medicine after the expiration date. NOTE: This sheet is a summary. It may not cover all possible information. If you have questions about this medicine, talk to your doctor, pharmacist, or health care provider.  2020 Elsevier/Gold Standard (2018-05-22 09:45:59)

## 2019-08-21 NOTE — Progress Notes (Signed)
HPI Chelsea Morgan returns today for followup after a 5 year absence from our EP clinic. She is a pleasant 69 yo woman with atrial flutter who underwent EP study and catheter ablation about 6years ago for atrial flutter. She has done well in the interim but was found on random medical eval to have developed atrial fib. She was asymptomatic. She has not gone on systemic anti-coagulation. She denies palpitations and states that she feels great. No edema and no sob.  No Known Allergies   Current Outpatient Medications  Medication Sig Dispense Refill  . aspirin 81 MG tablet Take 81 mg by mouth daily.    Marland Kitchen atorvastatin (LIPITOR) 20 MG tablet Take 1 tablet by mouth once daily 90 tablet 1  . BIOTIN 5000 PO Take 1 tablet by mouth daily.     . carvedilol (COREG) 12.5 MG tablet Take 1/2 (one-half) tablet by mouth twice daily 90 tablet 1  . Cholecalciferol (VITAMIN D) 2000 UNITS CAPS Take 4,000-6,000 Units by mouth daily.     Marland Kitchen EPINEPHrine (EPIPEN) 0.3 mg/0.3 mL DEVI Inject 0.3 mg into the muscle once.      . finasteride (PROSCAR) 5 MG tablet Take 1/2 (one-half) tablet by mouth once daily 45 tablet 1  . hydrochlorothiazide (HYDRODIURIL) 25 MG tablet Take 1 tablet by mouth once daily 90 tablet 1  . Magnesium 250 MG TABS Take 1 tablet by mouth 2 (two) times daily.    . mometasone (NASONEX) 50 MCG/ACT nasal spray Place 2 sprays into the nose daily as needed (congestion). 17 g 2  . Multiple Vitamins-Minerals (ZINC PO) Take by mouth.    . Omega-3 Fatty Acids (FISH OIL) 1360 MG CAPS Take by mouth daily.    Marland Kitchen omeprazole (PRILOSEC) 40 MG capsule Take 1 capsule by mouth once daily 30 capsule 0  . POTASSIUM PO Take 1 capsule by mouth daily. OTC     No current facility-administered medications for this visit.     Past Medical History:  Diagnosis Date  . ALLERGIC RHINITIS 01/12/2008  . Atrial flutter ([place])   . Blood transfusion without reported diagnosis 1981   after ectopic pregnancy  . CAROTID BRUITS,  BILATERAL 04/01/2009  . Essential hypertension, benign 01/12/2008  . HYPERLIPIDEMIA 05/02/2007  . Rheumatic fever     ROS:   All systems reviewed and negative except as noted in the HPI.   Past Surgical History:  Procedure Laterality Date  . ABLATION  12-28-13   atrial flutter ablation by Dr Lovena Le  . ATRIAL FLUTTER ABLATION N/A 12/28/2013   Procedure: ATRIAL FLUTTER ABLATION;  Surgeon: Evans Lance, MD;  Location: [facility] CATH LAB;  Service: Cardiovascular;  Laterality: N/A;  . CARPAL TUNNEL RELEASE Right 2007  . SALPINGECTOMY  1981     Family History  Problem Relation Age of Onset  . Cancer Mother        stomach  . Esophageal cancer Mother 61       [facility] pt esophageal ca [facility]  . Stroke Father   . Cancer Paternal Aunt        colon  . Colon cancer Paternal Aunt 49  . Rectal cancer Neg Hx   . Stomach cancer Neg Hx      Social History   Socioeconomic History  . Marital status: Married    Spouse name: Not on file  . Number of children: Not on file  . Years of education: Not on file  . Highest education  level: Not on file  Occupational History  . Not on file  Tobacco Use  . Smoking status: Never Smoker  . Smokeless tobacco: Never Used  Substance and Sexual Activity  . Alcohol use: No    Alcohol/week: 0.0 standard drinks  . Drug use: Yes    Comment: rare  . Sexual activity: Not on file  Other Topics Concern  . Not on file  Social History Narrative  . Not on file   Social Determinants of Health   Financial Resource Strain:   . Difficulty of Paying Living Expenses:   Food Insecurity:   . Worried About Programme researcher, broadcasting/film/video in the Last Year:   . Barista in the Last Year:   Transportation Needs:   . Freight forwarder (Medical):   Marland Kitchen Lack of Transportation (Non-Medical):   Physical Activity:   . Days of Exercise per Week:   . Minutes of Exercise per Session:   Stress:   . Feeling of Stress :   Social Connections:   . Frequency of  Communication with Friends and Family:   . Frequency of Social Gatherings with Friends and Family:   . Attends Religious Services:   . Active Member of Clubs or Organizations:   . Attends Banker Meetings:   Marland Kitchen Marital Status:   Intimate Partner Violence:   . Fear of Current or Ex-Partner:   . Emotionally Abused:   Marland Kitchen Physically Abused:   . Sexually Abused:      BP 132/70   Pulse 90   Ht 5\' 1"  (1.549 m)   Wt 130 lb 9.6 oz (59.2 kg)   SpO2 98%   BMI 24.68 kg/m   Physical Exam:  Well appearing NAD HEENT: Unremarkable Neck:  No JVD, no thyromegally Lymphatics:  No adenopathy Back:  No CVA tenderness Lungs:  Clear with no wheezes HEART:  IRegular rate rhythm, no murmurs, no rubs, no clicks Abd:  soft, positive bowel sounds, no organomegally, no rebound, no guarding Ext:  2 plus pulses, no edema, no cyanosis, no clubbing Skin:  No rashes no nodules Neuro:  CN II through XII intact, motor grossly intact  EKG =- atrial fib with a controlled VR  Assess/Plan: 1. New onset atrial fib - she is completely asymptomatic and her rate appears to be controlled. I discussed the treatment opions. Her CHADSVASC is 3. I have recommended she continue with rate control and anti-coag with a  NoAC. She will try xarelto as she only wants to take her medication once daily.  2. Atrial flutter - she is s/p ablation with no recurrence. 3. HTN - she is controlled on medical therapy.  .D.

## 2019-08-27 DIAGNOSIS — E859 Amyloidosis, unspecified: Secondary | ICD-10-CM | POA: Diagnosis not present

## 2019-08-27 DIAGNOSIS — Z79899 Other long term (current) drug therapy: Secondary | ICD-10-CM | POA: Diagnosis not present

## 2019-09-19 DIAGNOSIS — E859 Amyloidosis, unspecified: Secondary | ICD-10-CM | POA: Diagnosis not present

## 2019-09-24 ENCOUNTER — Other Ambulatory Visit: Payer: Self-pay | Admitting: Family Medicine

## 2019-09-24 DIAGNOSIS — L659 Nonscarring hair loss, unspecified: Secondary | ICD-10-CM

## 2019-09-24 DIAGNOSIS — I10 Essential (primary) hypertension: Secondary | ICD-10-CM

## 2019-09-24 DIAGNOSIS — I4892 Unspecified atrial flutter: Secondary | ICD-10-CM

## 2019-10-03 DIAGNOSIS — E8581 Light chain (AL) amyloidosis: Secondary | ICD-10-CM | POA: Diagnosis not present

## 2019-10-03 DIAGNOSIS — E859 Amyloidosis, unspecified: Secondary | ICD-10-CM | POA: Diagnosis not present

## 2019-10-08 ENCOUNTER — Telehealth: Payer: Self-pay | Admitting: Family Medicine

## 2019-10-08 ENCOUNTER — Encounter: Payer: Self-pay | Admitting: Family Medicine

## 2019-10-08 ENCOUNTER — Other Ambulatory Visit: Payer: Self-pay | Admitting: Nurse Practitioner

## 2019-10-08 ENCOUNTER — Telehealth: Payer: Self-pay | Admitting: Nurse Practitioner

## 2019-10-08 DIAGNOSIS — I480 Paroxysmal atrial fibrillation: Secondary | ICD-10-CM

## 2019-10-08 DIAGNOSIS — I10 Essential (primary) hypertension: Secondary | ICD-10-CM

## 2019-10-08 DIAGNOSIS — U071 COVID-19: Secondary | ICD-10-CM | POA: Insufficient documentation

## 2019-10-08 NOTE — Telephone Encounter (Signed)
Pt should be quarantining at home and she needs to notify all of her recent close contacts.  What day did she test +?  Since she was vaccinated, we are going to report this to the state as a breakthrough infection  She meets criteria for antibody infusion based on age so we will call the infusion center and they can reach out to her to determine if this is something she wants to move forward with  Drink plenty of fluids, REST, Vit D, Vit C, Zinc.  Tylenol or ibuprofen as needed for body aches/fever.  If any shortness of breath or inability to stay hydrated, she needs to go to ER.

## 2019-10-08 NOTE — Telephone Encounter (Signed)
Spoke with pt and advised of PCP recommendations. Pt states that the test she used was from [facility] lab and she tested positive yesterday.

## 2019-10-08 NOTE — Telephone Encounter (Signed)
Patient did an at home covid test and tested positive - she is not feeling well and wants to know what restrictions she should be under - please advise

## 2019-10-08 NOTE — Telephone Encounter (Signed)
Please advise 

## 2019-10-08 NOTE — Telephone Encounter (Signed)
Completed Breakthrough Case Report to the state

## 2019-10-08 NOTE — Telephone Encounter (Signed)
Called to discuss with Chelsea Morgan about Covid symptoms and the use of casirivimab/imdevimab, a combination monoclonal antibody infusion for those with mild to moderate Covid symptoms and at a high risk of hospitalization.     Pt is qualified for this infusion at the [facility] infusion center due to co-morbid conditions (hypertension, a-fib). She reports symptom onset of 10/05/19 with cough, congestion, fatigue.   Appointment scheduled as requested for 10/10/19 @ 1030. Patient verbalized understanding of infusion and appointment details. MyChart message also sent.    Patient Active Problem List   Diagnosis Date Noted  . COVID-19 virus infection 10/08/2019  . Amyloidosis (HCC) 07/30/2019  . Hoarseness, chronic 01/29/2019  . Hair loss 09/15/2016  . GERD (gastroesophageal reflux disease) 09/15/2016  . Physical exam 09/15/2016  . Paroxysmal A-fib (HCC) 10/31/2013  . Essential hypertension, benign 01/12/2008  . Allergic rhinitis 01/12/2008  . Dyslipidemia 05/02/2007   Willette Alma, AGPCNP-BC

## 2019-10-08 NOTE — Progress Notes (Signed)
I connected by phone with Chelsea Morgan on 10/08/2019 at 2:05 PM to discuss the potential use of a new treatment for mild to moderate COVID-19 viral infection in non-hospitalized patients.  This patient is a 69 y.o. female that meets the FDA criteria for Emergency Use Authorization of COVID monoclonal antibody casirivimab/imdevimab.  Has a (+) direct SARS-CoV-2 viral test result  Has mild or moderate COVID-19   Is NOT hospitalized due to COVID-19  Is within 10 days of symptom onset  Has at least one of the high risk factor(s) for progression to severe COVID-19 and/or hospitalization as defined in EUA.  Specific high risk criteria : Cardiovascular disease or hypertension   I have spoken and communicated the following to the patient or parent/caregiver regarding COVID monoclonal antibody treatment:  1. FDA has authorized the emergency use for the treatment of mild to moderate COVID-19 in adults and pediatric patients with positive results of direct SARS-CoV-2 viral testing who are 71 years of age and older weighing at least 40 kg, and who are at high risk for progressing to severe COVID-19 and/or hospitalization.  2. The significant known and potential risks and benefits of COVID monoclonal antibody, and the extent to which such potential risks and benefits are unknown.  3. Information on available alternative treatments and the risks and benefits of those alternatives, including clinical trials.  4. Patients treated with COVID monoclonal antibody should continue to self-isolate and use infection control measures (e.g., wear mask, isolate, social distance, avoid sharing personal items, clean and disinfect "high touch" surfaces, and frequent handwashing) according to CDC guidelines.   5. The patient or parent/caregiver has the option to accept or refuse COVID monoclonal antibody treatment.  After reviewing this information with the patient, The patient agreed to proceed with receiving  casirivimab\imdevimab infusion and will be provided a copy of the Fact sheet prior to receiving the infusion. Jake Samples Pickenpack-Cousar 10/08/2019 2:05 PM

## 2019-10-09 MED ORDER — SODIUM CHLORIDE 0.9 % IV SOLN
Freq: Once | INTRAVENOUS | Status: AC
  Filled 2019-10-09: qty 600

## 2019-10-10 ENCOUNTER — Ambulatory Visit (HOSPITAL_COMMUNITY)
Admission: RE | Admit: 2019-10-10 | Discharge: 2019-10-10 | Disposition: A | Discharge status: Home / Self Care | Payer: Medicare Other | Source: Ambulatory Visit | Attending: Pulmonary Disease | Admitting: Pulmonary Disease

## 2019-10-10 DIAGNOSIS — U071 COVID-19: Secondary | ICD-10-CM

## 2019-10-10 DIAGNOSIS — I480 Paroxysmal atrial fibrillation: Secondary | ICD-10-CM | POA: Diagnosis present

## 2019-10-10 DIAGNOSIS — I10 Essential (primary) hypertension: Secondary | ICD-10-CM | POA: Diagnosis present

## 2019-10-10 DIAGNOSIS — Z23 Encounter for immunization: Secondary | ICD-10-CM | POA: Insufficient documentation

## 2019-10-10 MED ORDER — FAMOTIDINE IN NACL 20-0.9 MG/50ML-% IV SOLN: 20.0000 mg | Freq: Once | INTRAVENOUS | Status: DC | PRN

## 2019-10-10 MED ORDER — ALBUTEROL SULFATE HFA 108 (90 BASE) MCG/ACT IN AERS: 2.0000 | INHALATION_SPRAY | Freq: Once | RESPIRATORY_TRACT | Status: DC | PRN

## 2019-10-10 MED ORDER — METHYLPREDNISOLONE SODIUM SUCC 125 MG IJ SOLR: 125.0000 mg | Freq: Once | INTRAMUSCULAR | Status: DC | PRN

## 2019-10-10 MED ORDER — DIPHENHYDRAMINE HCL 50 MG/ML IJ SOLN: 50.0000 mg | Freq: Once | INTRAMUSCULAR | Status: DC | PRN

## 2019-10-10 MED ORDER — EPINEPHRINE 0.3 MG/0.3ML IJ SOAJ: 0.3000 mg | Freq: Once | INTRAMUSCULAR | Status: DC | PRN

## 2019-10-10 MED ORDER — SODIUM CHLORIDE 0.9 % IV SOLN: INTRAVENOUS | Status: DC | PRN

## 2019-10-10 NOTE — Discharge Instructions (Signed)

## 2019-10-10 NOTE — Progress Notes (Signed)
  Diagnosis: COVID-19  Physician:Dr Wright  Procedure: Covid Infusion Clinic Med: casirivimab\imdevimab infusion - Provided patient with casirivimab\imdevimab fact sheet for patients, parents and caregivers prior to infusion.  Complications: No immediate complications noted.  Discharge: Discharged home   Chelsea Morgan 10/10/2019  

## 2019-10-29 DIAGNOSIS — E859 Amyloidosis, unspecified: Secondary | ICD-10-CM | POA: Diagnosis not present

## 2019-10-29 DIAGNOSIS — I517 Cardiomegaly: Secondary | ICD-10-CM | POA: Diagnosis not present

## 2019-11-08 DIAGNOSIS — Z1231 Encounter for screening mammogram for malignant neoplasm of breast: Secondary | ICD-10-CM | POA: Diagnosis not present

## 2019-11-08 DIAGNOSIS — Z01419 Encounter for gynecological examination (general) (routine) without abnormal findings: Secondary | ICD-10-CM | POA: Diagnosis not present

## 2019-11-08 LAB — HM MAMMOGRAPHY: HM Mammogram: NORMAL (ref 0–4)

## 2019-11-08 LAB — HM PAP SMEAR

## 2019-11-16 ENCOUNTER — Encounter: Payer: Self-pay | Admitting: General Practice

## 2019-11-26 DIAGNOSIS — J384 Edema of larynx: Secondary | ICD-10-CM | POA: Diagnosis not present

## 2019-11-26 DIAGNOSIS — J383 Other diseases of vocal cords: Secondary | ICD-10-CM | POA: Diagnosis not present

## 2019-11-26 DIAGNOSIS — Z9889 Other specified postprocedural states: Secondary | ICD-10-CM | POA: Diagnosis not present

## 2019-11-26 DIAGNOSIS — R490 Dysphonia: Secondary | ICD-10-CM | POA: Diagnosis not present

## 2019-12-19 ENCOUNTER — Other Ambulatory Visit: Payer: Self-pay | Admitting: Family Medicine

## 2019-12-19 DIAGNOSIS — I4892 Unspecified atrial flutter: Secondary | ICD-10-CM

## 2019-12-19 DIAGNOSIS — I10 Essential (primary) hypertension: Secondary | ICD-10-CM

## 2019-12-19 DIAGNOSIS — L659 Nonscarring hair loss, unspecified: Secondary | ICD-10-CM

## 2019-12-21 DIAGNOSIS — R69 Illness, unspecified: Secondary | ICD-10-CM | POA: Diagnosis not present

## 2020-01-18 ENCOUNTER — Telehealth: Payer: Self-pay | Admitting: Family Medicine

## 2020-01-18 NOTE — Telephone Encounter (Signed)
Spoke with patient to scheduled her AWV.  She did not understand doing the AWV.  She stated she will see her pcp on 02/13/20.  She clearly did not understand what I was asking about scheduling the appointment for the AWV.

## 2020-01-21 DIAGNOSIS — R69 Illness, unspecified: Secondary | ICD-10-CM | POA: Diagnosis not present

## 2020-02-04 DIAGNOSIS — R490 Dysphonia: Secondary | ICD-10-CM | POA: Diagnosis not present

## 2020-02-04 DIAGNOSIS — E8581 Light chain (AL) amyloidosis: Secondary | ICD-10-CM | POA: Diagnosis not present

## 2020-02-11 ENCOUNTER — Encounter: Payer: Medicare HMO | Admitting: Family Medicine

## 2020-02-13 ENCOUNTER — Encounter: Payer: Medicare HMO | Admitting: Family Medicine

## 2020-02-18 ENCOUNTER — Other Ambulatory Visit: Payer: Self-pay

## 2020-02-18 ENCOUNTER — Ambulatory Visit (INDEPENDENT_AMBULATORY_CARE_PROVIDER_SITE_OTHER): Payer: Medicare HMO | Admitting: Family Medicine

## 2020-02-18 ENCOUNTER — Encounter: Payer: Self-pay | Admitting: Family Medicine

## 2020-02-18 VITALS — BP 120/70 | HR 80 | Temp 98.0°F | Resp 16 | Ht 61.0 in | Wt 133.6 lb

## 2020-02-18 DIAGNOSIS — Z780 Asymptomatic menopausal state: Secondary | ICD-10-CM

## 2020-02-18 DIAGNOSIS — Z0000 Encounter for general adult medical examination without abnormal findings: Secondary | ICD-10-CM

## 2020-02-18 DIAGNOSIS — I10 Essential (primary) hypertension: Secondary | ICD-10-CM

## 2020-02-18 DIAGNOSIS — I480 Paroxysmal atrial fibrillation: Secondary | ICD-10-CM | POA: Diagnosis not present

## 2020-02-18 LAB — CBC WITH DIFFERENTIAL/PLATELET
Basophils Absolute: 0 10*3/uL (ref 0.0–0.1)
Basophils Relative: 0.8 % (ref 0.0–3.0)
Eosinophils Absolute: 0.2 10*3/uL (ref 0.0–0.7)
Eosinophils Relative: 3.9 % (ref 0.0–5.0)
HCT: 41.3 % (ref 36.0–46.0)
Hemoglobin: 13.6 g/dL (ref 12.0–15.0)
Lymphocytes Relative: 24.8 % (ref 12.0–46.0)
Lymphs Abs: 1.1 10*3/uL (ref 0.7–4.0)
MCHC: 33 g/dL (ref 30.0–36.0)
MCV: 90 fl (ref 78.0–100.0)
Monocytes Absolute: 0.4 10*3/uL (ref 0.1–1.0)
Monocytes Relative: 8.3 % (ref 3.0–12.0)
Neutro Abs: 2.8 10*3/uL (ref 1.4–7.7)
Neutrophils Relative %: 62.2 % (ref 43.0–77.0)
Platelets: 246 10*3/uL (ref 150.0–400.0)
RBC: 4.58 Mil/uL (ref 3.87–5.11)
RDW: 13.7 % (ref 11.5–15.5)
WBC: 4.5 10*3/uL (ref 4.0–10.5)

## 2020-02-18 LAB — HEPATIC FUNCTION PANEL
ALT: 26 U/L (ref 0–35)
AST: 20 U/L (ref 0–37)
Albumin: 4.2 g/dL (ref 3.5–5.2)
Alkaline Phosphatase: 77 U/L (ref 39–117)
Bilirubin, Direct: 0.2 mg/dL (ref 0.0–0.3)
Total Bilirubin: 1.2 mg/dL (ref 0.2–1.2)
Total Protein: 6.7 g/dL (ref 6.0–8.3)

## 2020-02-18 LAB — BASIC METABOLIC PANEL
BUN: 12 mg/dL (ref 6–23)
CO2: 30 mEq/L (ref 19–32)
Calcium: 9.7 mg/dL (ref 8.4–10.5)
Chloride: 103 mEq/L (ref 96–112)
Creatinine, Ser: 0.75 mg/dL (ref 0.40–1.20)
GFR: 80.09 mL/min (ref >60.00)
Glucose, Bld: 99 mg/dL (ref 70–99)
Potassium: 3.8 mEq/L (ref 3.5–5.1)
Sodium: 141 mEq/L (ref 135–145)

## 2020-02-18 LAB — LIPID PANEL
Cholesterol: 165 mg/dL (ref 0–200)
HDL: 60.8 mg/dL (ref >39.00)
LDL Cholesterol: 88 mg/dL (ref 0–99)
NonHDL: 104.67
Total CHOL/HDL Ratio: 3
Triglycerides: 81 mg/dL (ref 0.0–149.0)
VLDL: 16.2 mg/dL (ref 0.0–40.0)

## 2020-02-18 LAB — TSH: TSH: 2.98 u[IU]/mL (ref 0.35–4.50)

## 2020-02-18 NOTE — Patient Instructions (Signed)
Follow up in 6 months to recheck BP and cholesterol We'll notify you of your lab results and make any changes if needed Continue to work on healthy diet and regular exercise- you're doing great! We'll call you with your bone density appt Call with any questions or concerns Stay Safe!  Stay Healthy! Happy Holidays!!! 

## 2020-02-18 NOTE — Assessment & Plan Note (Signed)
Chronic problem.  Well controlled today.  Asymptomatic.  Check labs.

## 2020-02-18 NOTE — Assessment & Plan Note (Signed)
Pt's PE WNL w/ exception of known Afib and chronic hoarseness.  UTD on pap, mammo, colonoscopy, immunizations.  DEXA ordered.  Check labs.  Anticipatory guidance provided.

## 2020-02-18 NOTE — Progress Notes (Signed)
   Subjective:    Patient ID: Chelsea Morgan, female    DOB: 1950-05-10, 69 y.o.   MRN: [id number]  HPI CPE- UTD on colonoscopy, mammogram, pap, COVID vaccines, pneumonia vaccines, flu shot, Tdap.   Reviewed past medical, surgical, family and social histories.   Health Maintenance  Topic Date Due  . COVID-19 Vaccine (3 - Booster for Pfizer series) 03/05/2020 (Originally 11/08/2019)  . Hepatitis C Screening  07/29/2020 (Originally November 10, 1950)  . MAMMOGRAM  11/07/2020  . TETANUS/TDAP  08/08/2023  . COLONOSCOPY  11/27/2024  . INFLUENZA VACCINE  Completed  . DEXA SCAN  Completed  . PNA vac Low Risk Adult  Completed     Patient Care Team    Relationship Specialty Notifications Start End  Sheliah Hatch, MD PCP - General Family Medicine  09/15/16   Coralie Carpen, MD Consulting Physician Family Medicine  01/29/19   Bradly Bienenstock, MD Consulting Physician Orthopedic Surgery  01/29/19   Marlow Baars, MD Consulting Physician Obstetrics  01/29/19       Review of Systems Patient reports no vision/ hearing changes, adenopathy,fever, weight change, chest pain, palpitations, edema, persistant/recurrent cough, hemoptysis, dyspnea (rest/exertional/paroxysmal nocturnal), gastrointestinal bleeding (melena, rectal bleeding), abdominal pain, significant heartburn, bowel changes, GU symptoms (dysuria, hematuria, incontinence), Gyn symptoms (abnormal  bleeding, pain),  syncope, focal weakness, memory loss, skin/hair/nail changes, abnormal bruising or bleeding, anxiety, or depression.   + hoarseness- due to amyloid + residual dysphagia + carpal tunnel L  This visit occurred during the SARS-CoV-2 public health emergency.  Safety protocols were in place, including screening questions prior to the visit, additional usage of staff PPE, and extensive cleaning of exam room while observing appropriate contact time as indicated for disinfecting solutions.       Objective:   Physical Exam General  Appearance:    Alert, cooperative, no distress, appears stated age  Head:    Normocephalic, without obvious abnormality, atraumatic  Eyes:    PERRL, conjunctiva/corneas clear, EOM's intact, fundi    benign, both eyes  Ears:    Normal TM's and external ear canals, both ears  Nose:   Deferred due to COVID  Throat:   Neck:   Supple, symmetrical, trachea midline, no adenopathy;    Thyroid: no enlargement/tenderness/nodules  Back:     Symmetric, no curvature, ROM normal, no CVA tenderness  Lungs:     Clear to auscultation bilaterally, respirations unlabored  Chest Wall:    No tenderness or deformity   Heart:    Irregular S1 and S2, no murmur, rub, or gallop  Breast Exam:    Deferred to GYN  Abdomen:     Soft, non-tender, bowel sounds active all four quadrants,    no masses, no organomegaly  Genitalia:    Deferred to GYN  Rectal:    Extremities:   Extremities normal, atraumatic, no cyanosis or edema  Pulses:   2+ and symmetric all extremities  Skin:   Skin color, texture, turgor normal, no rashes or lesions  Lymph nodes:   Cervical, supraclavicular, and axillary nodes normal  Neurologic:   CNII-XII intact, normal strength, sensation and reflexes    throughout          Assessment & Plan:

## 2020-02-18 NOTE — Assessment & Plan Note (Signed)
Pt is again in Afib.  Has appt w/ Dr Graciela Husbands tomorrow.  Will follow along.

## 2020-02-19 ENCOUNTER — Other Ambulatory Visit: Payer: Self-pay

## 2020-02-19 ENCOUNTER — Encounter: Payer: Self-pay | Admitting: Internal Medicine

## 2020-02-19 ENCOUNTER — Ambulatory Visit: Payer: Medicare HMO | Admitting: Internal Medicine

## 2020-02-19 ENCOUNTER — Telehealth: Payer: Self-pay | Admitting: Family Medicine

## 2020-02-19 VITALS — BP 130/78 | HR 79 | Ht 61.0 in | Wt 133.2 lb

## 2020-02-19 DIAGNOSIS — I480 Paroxysmal atrial fibrillation: Secondary | ICD-10-CM

## 2020-02-19 DIAGNOSIS — I10 Essential (primary) hypertension: Secondary | ICD-10-CM | POA: Diagnosis not present

## 2020-02-19 NOTE — Telephone Encounter (Signed)
Patient called and said that someone had called about her lab results - please advise

## 2020-02-19 NOTE — Patient Instructions (Signed)

## 2020-02-19 NOTE — Progress Notes (Signed)
HPI Chelsea Morgan (pronounced Chelsea Morgan) returns today for followup. She is a pleasant 69 yo woman with a h/o atrial flutter who underwent catheter ablation years ago and then developed atrial fib for which she was asymptomatic. When I saw her I recommended Xarelto (CHADSVASC 3), but she has declined. She has not been taking. She was double vaccinated but still got Covid in July. She has recovered. She does not have palpitations and her atrial fib is controlled. Allergies  Allergen Reactions  . Sulfa Antibiotics Swelling    Hospitalized x 3 days 20001     Current Outpatient Medications  Medication Sig Dispense Refill  . atorvastatin (LIPITOR) 20 MG tablet Take 1 tablet by mouth once daily 90 tablet 0  . BIOTIN 5000 PO Take 1 tablet by mouth daily.     . carvedilol (COREG) 12.5 MG tablet Take 1/2 (one-half) tablet by mouth twice daily 90 tablet 0  . Cholecalciferol (VITAMIN D) 2000 UNITS CAPS Take 4,000-6,000 Units by mouth daily.     Marland Kitchen EPINEPHrine (EPI-PEN) 0.3 mg/0.3 mL DEVI Inject 0.3 mg into the muscle once.    . finasteride (PROSCAR) 5 MG tablet Take 1/2 (one-half) tablet by mouth once daily 45 tablet 0  . hydrochlorothiazide (HYDRODIURIL) 25 MG tablet Take 1 tablet by mouth once daily 90 tablet 0  . Magnesium 250 MG TABS Take 1 tablet by mouth 2 (two) times daily.    . mometasone (NASONEX) 50 MCG/ACT nasal spray Place 2 sprays into the nose daily as needed (congestion). 17 g 2  . Multiple Vitamins-Minerals (ZINC PO) Take by mouth.    . Omega-3 Fatty Acids (FISH OIL) 1360 MG CAPS Take by mouth daily.    Marland Kitchen omeprazole (PRILOSEC) 40 MG capsule Take 1 capsule by mouth once daily 30 capsule 0  . POTASSIUM PO Take 1 capsule by mouth daily. OTC    . rivaroxaban (XARELTO) 20 MG TABS tablet Take 1 tablet (20 mg total) by mouth daily with supper. 30 tablet 11   No current facility-administered medications for this visit.     Past Medical History:  Diagnosis Date  . ALLERGIC RHINITIS  01/12/2008  . Atrial flutter (HCC)   . Blood transfusion without reported diagnosis 1981   after ectopic pregnancy  . CAROTID BRUITS, BILATERAL 04/01/2009  . Essential hypertension, benign 01/12/2008  . HYPERLIPIDEMIA 05/02/2007  . Rheumatic fever     ROS:   All systems reviewed and negative except as noted in the HPI.   Past Surgical History:  Procedure Laterality Date  . ABLATION  12-28-13   atrial flutter ablation by Dr Ladona Ridgel  . ATRIAL FLUTTER ABLATION N/A 12/28/2013   Procedure: ATRIAL FLUTTER ABLATION;  Surgeon: Marinus Maw, MD;  Location: [facility] CATH LAB;  Service: Cardiovascular;  Laterality: N/A;  . CARPAL TUNNEL RELEASE Right 2007  . SALPINGECTOMY  1981     Family History  Problem Relation Age of Onset  . Cancer Mother        stomach  . Esophageal cancer Mother 8       per pt esophageal ca at [facility]  . Stroke Father   . Cancer Paternal Aunt        colon  . Colon cancer Paternal Aunt 91  . Rectal cancer Neg Hx   . Stomach cancer Neg Hx      Social History   Socioeconomic History  . Marital status: Married    Spouse name: Not on file  .  Number of children: Not on file  . Years of education: Not on file  . Highest education level: Not on file  Occupational History  . Not on file  Tobacco Use  . Smoking status: Never Smoker  . Smokeless tobacco: Never Used  Substance and Sexual Activity  . Alcohol use: No    Alcohol/week: 0.0 standard drinks  . Drug use: Yes    Comment: rare  . Sexual activity: Not on file  Other Topics Concern  . Not on file  Social History Narrative  . Not on file   Social Determinants of Health   Financial Resource Strain: Not on file  Food Insecurity: Not on file  Transportation Needs: Not on file  Physical Activity: Not on file  Stress: Not on file  Social Connections: Not on file  Intimate Partner Violence: Not on file     BP 130/78   Pulse 79   Ht 5\' 1"  (1.549 m)   Wt 133 lb 3.2 oz (60.4 kg)   SpO2 99%    BMI 25.17 kg/m   Physical Exam:  Well appearing NAD HEENT: Unremarkable Neck:  No JVD, no thyromegally Lymphatics:  No adenopathy Back:  No CVA tenderness Lungs:  Clear with no wheezes HEART:  Regular rate rhythm, no murmurs, no rubs, no clicks Abd:  soft, positive bowel sounds, no organomegally, no rebound, no guarding Ext:  2 plus pulses, no edema, no cyanosis, no clubbing Skin:  No rashes no nodules Neuro:  CN II through XII intact, motor grossly intact  EKG - atrial fib with a CVR   Assess/Plan: 1. Atrial fib - her rates are controlled. No change in her meds.  2. Coags - she decided not to take Xarelto. I explained the risk/benefit but she prefers to avoid systemic anti-coagulation. 3. 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 She has had a 2D echo which was reassuring. 4. Atrial flutter - she is s/p ablation and has had no recurrent flutter.  Chelsea Rupert,MD

## 2020-02-21 NOTE — Telephone Encounter (Signed)
Spoke with patient regarding lab results. 

## 2020-03-08 HISTORY — PX: CATARACT EXTRACTION: SUR2

## 2020-03-11 ENCOUNTER — Other Ambulatory Visit: Payer: Self-pay | Admitting: Family Medicine

## 2020-03-11 DIAGNOSIS — I4892 Unspecified atrial flutter: Secondary | ICD-10-CM

## 2020-03-11 DIAGNOSIS — L659 Nonscarring hair loss, unspecified: Secondary | ICD-10-CM

## 2020-03-11 DIAGNOSIS — I10 Essential (primary) hypertension: Secondary | ICD-10-CM

## 2020-04-07 ENCOUNTER — Other Ambulatory Visit: Payer: Self-pay

## 2020-04-07 ENCOUNTER — Ambulatory Visit (INDEPENDENT_AMBULATORY_CARE_PROVIDER_SITE_OTHER)
Admission: RE | Admit: 2020-04-07 | Discharge: 2020-04-07 | Disposition: A | Discharge status: Home / Self Care | Payer: Medicare HMO | Source: Ambulatory Visit | Attending: Family Medicine | Admitting: Family Medicine

## 2020-04-07 DIAGNOSIS — Z780 Asymptomatic menopausal state: Secondary | ICD-10-CM

## 2020-05-05 ENCOUNTER — Other Ambulatory Visit: Payer: Self-pay | Admitting: Family Medicine

## 2020-05-31 ENCOUNTER — Other Ambulatory Visit: Payer: Self-pay | Admitting: Family Medicine

## 2020-05-31 DIAGNOSIS — L659 Nonscarring hair loss, unspecified: Secondary | ICD-10-CM

## 2020-05-31 DIAGNOSIS — I4892 Unspecified atrial flutter: Secondary | ICD-10-CM

## 2020-05-31 DIAGNOSIS — I10 Essential (primary) hypertension: Secondary | ICD-10-CM

## 2020-06-04 DIAGNOSIS — Z8616 Personal history of COVID-19: Secondary | ICD-10-CM | POA: Diagnosis not present

## 2020-06-04 DIAGNOSIS — R490 Dysphonia: Secondary | ICD-10-CM | POA: Diagnosis not present

## 2020-06-04 DIAGNOSIS — J384 Edema of larynx: Secondary | ICD-10-CM | POA: Diagnosis not present

## 2020-07-14 ENCOUNTER — Other Ambulatory Visit: Payer: Self-pay

## 2020-07-14 ENCOUNTER — Ambulatory Visit (INDEPENDENT_AMBULATORY_CARE_PROVIDER_SITE_OTHER): Payer: Medicare HMO

## 2020-07-14 VITALS — BP 138/80 | HR 83 | Temp 98.1°F | Resp 16 | Ht 61.0 in | Wt 137.0 lb

## 2020-07-14 DIAGNOSIS — Z0000 Encounter for general adult medical examination without abnormal findings: Secondary | ICD-10-CM | POA: Diagnosis not present

## 2020-07-14 NOTE — Patient Instructions (Signed)
Chelsea Morgan , Thank you for taking time to come for your Medicare Wellness Visit. I appreciate your ongoing commitment to your health goals. Please review the following plan we discussed and let me know if I can assist you in the future.   Screening recommendations/referrals: Colonoscopy: Completed 11/28/2014-Due 11/27/2024 Mammogram: Completed 11/08/2019-Due 11/07/2020  Bone Density: Completed 04/07/2020-Due 04/07/2022 Recommended yearly ophthalmology/optometry visit for glaucoma screening and checkup Recommended yearly dental visit for hygiene and checkup  Vaccinations: Influenza vaccine: up to date Pneumococcal vaccine: completed vaccines Tdap vaccine: up to date-due-08/08/2023 Shingles vaccine:completed vaccines   Covid-19:booster due  Advanced directives: Please bring a copy for your chart  Conditions/risks identified: See problem list  Next appointment: Follow up in one year for your annual wellness visit 07/20/21 @10 :30   Preventive Care 65 Years and Older, Female Preventive care refers to lifestyle choices and visits with your health care provider that can promote health and wellness. What does preventive care include?  A yearly physical exam. This is also called an annual well check.  Dental exams once or twice a year.  Routine eye exams. Ask your health care provider how often you should have your eyes checked.  Personal lifestyle choices, including:  Daily care of your teeth and gums.  Regular physical activity.  Eating a healthy diet.  Avoiding tobacco and drug use.  Limiting alcohol use.  Practicing safe sex.  Taking low-dose aspirin every day.  Taking vitamin and mineral supplements as recommended by your health care provider. What happens during an annual well check? The services and screenings done by your health care provider during your annual well check will depend on your age, overall health, lifestyle risk factors, and family history of disease. Counseling   Your health care provider may ask you questions about your:  Alcohol use.  Tobacco use.  Drug use.  Emotional well-being.  Home and relationship well-being.  Sexual activity.  Eating habits.  History of falls.  Memory and ability to understand (cognition).  Work and work .  Reproductive health. Screening  You may have the following tests or measurements:  Height, weight, and BMI.  Blood pressure.  Lipid and cholesterol levels. These may be checked every 5 years, or more frequently if you are over 56 years old.  Skin check.  Lung cancer screening. You may have this screening every year starting at age 1 if you have a 30-pack-year history of smoking and currently smoke or have quit within the past 15 years.  Fecal occult blood test (FOBT) of the stool. You may have this test every year starting at age 47.  Flexible sigmoidoscopy or colonoscopy. You may have a sigmoidoscopy every 5 years or a colonoscopy every 10 years starting at age 83.  Hepatitis C blood test.  Hepatitis B blood test.  Sexually transmitted disease (STD) testing.  Diabetes screening. This is done by checking your blood sugar (glucose) after you have not eaten for a while (fasting). You may have this done every 1-3 years.  Bone density scan. This is done to screen for osteoporosis. You may have this done starting at age 39.  Mammogram. This may be done every 1-2 years. Talk to your health care provider about how often you should have regular mammograms. Talk with your health care provider about your test results, treatment options, and if necessary, the need for more tests. Vaccines  Your health care provider may recommend certain vaccines, such as:  Influenza vaccine. This is recommended every year.  Tetanus,  diphtheria, and acellular pertussis (Tdap, Td) vaccine. You may need a Td booster every 10 years.  Zoster vaccine. You may need this after age 75.  Pneumococcal 13-valent  conjugate (PCV13) vaccine. One dose is recommended after age 67.  Pneumococcal polysaccharide (PPSV23) vaccine. One dose is recommended after age 58. Talk to your health care provider about which screenings and vaccines you need and how often you need them. This information is not intended to replace advice given to you by your health care provider. Make sure you discuss any questions you have with your health care provider. Document Released: 03/21/2015 Document Revised: 11/12/2015 Document Reviewed: 12/24/2014 Elsevier Interactive Patient Education  2017 [place] Prevention in the Home Falls can cause injuries. They can happen to people of all ages. There are many things you can do to make your home safe and to help prevent falls. What can I do on the outside of my home?  Regularly fix the edges of walkways and driveways and fix any cracks.  Remove anything that might make you trip as you walk through a door, such as a raised step or threshold.  Trim any bushes or trees on the path to your home.  Use bright outdoor lighting.  Clear any walking paths of anything that might make someone trip, such as rocks or tools.  Regularly check to see if handrails are loose or broken. Make sure that both sides of any steps have handrails.  Any raised decks and porches should have guardrails on the edges.  Have any leaves, snow, or ice cleared regularly.  Use sand or salt on walking paths during winter.  Clean up any spills in your garage right away. This includes oil or grease spills. What can I do in the bathroom?  Use night lights.  Install grab bars by the toilet and in the tub and shower. Do not use towel bars as grab bars.  Use non-skid mats or decals in the tub or shower.  If you need to sit down in the shower, use a plastic, non-slip stool.  Keep the floor dry. Clean up any water that spills on the floor as soon as it happens.  Remove soap buildup in the tub or  shower regularly.  Attach bath mats securely with double-sided non-slip rug tape.  Do not have throw rugs and other things on the floor that can make you trip. What can I do in the bedroom?  Use night lights.  Make sure that you have a light by your bed that is easy to reach.  Do not use any sheets or blankets that are too big for your bed. They should not hang down onto the floor.  Have a firm chair that has side arms. You can use this for support while you get dressed.  Do not have throw rugs and other things on the floor that can make you trip. What can I do in the kitchen?  Clean up any spills right away.  Avoid walking on wet floors.  Keep items that you use a lot in easy-to-reach places.  If you need to reach something above you, use a strong step stool that has a grab bar.  Keep electrical cords out of the way.  Do not use floor polish or wax that makes floors slippery. If you must use wax, use non-skid floor wax.  Do not have throw rugs and other things on the floor that can make you trip. What can I do with  my stairs?  Do not leave any items on the stairs.  Make sure that there are handrails on both sides of the stairs and use them. Fix handrails that are broken or loose. Make sure that handrails are as long as the stairways.  Check any carpeting to make sure that it is firmly attached to the stairs. Fix any carpet that is loose or worn.  Avoid having throw rugs at the top or bottom of the stairs. If you do have throw rugs, attach them to the floor with carpet tape.  Make sure that you have a light switch at the top of the stairs and the bottom of the stairs. If you do not have them, ask someone to add them for you. What else can I do to help prevent falls?  Wear shoes that:  Do not have high heels.  Have rubber bottoms.  Are comfortable and fit you well.  Are closed at the toe. Do not wear sandals.  If you use a stepladder:  Make sure that it is fully  opened. Do not climb a closed stepladder.  Make sure that both sides of the stepladder are locked into place.  Ask someone to hold it for you, if possible.  Clearly mark and make sure that you can see:  Any grab bars or handrails.  First and last steps.  Where the edge of each step is.  Use tools that help you move around (mobility aids) if they are needed. These include:  Canes.  Walkers.  Scooters.  Crutches.  Turn on the lights when you go into a dark area. Replace any light bulbs as soon as they burn out.  Set up your furniture so you have a clear path. Avoid moving your furniture around.  If any of your floors are uneven, fix them.  If there are any pets around you, be aware of where they are.  Review your medicines with your doctor. Some medicines can make you feel dizzy. This can increase your chance of falling. Ask your doctor what other things that you can do to help prevent falls. This information is not intended to replace advice given to you by your health care provider. Make sure you discuss any questions you have with your health care provider. Document Released: 12/19/2008 Document Revised: 07/31/2015 Document Reviewed: 03/29/2014 Elsevier Interactive Patient Education  2017 [employer].

## 2020-07-14 NOTE — Progress Notes (Signed)
Subjective:   Chelsea Morgan is a 70 y.o. female who presents for Medicare Annual (Subsequent) preventive examination.  Review of Systems     Cardiac Risk Factors include: advanced age (>59men, >69 women);hypertension;dyslipidemia     Objective:    Today's Vitals   07/14/20 1243  BP: 138/80  Pulse: 83  Resp: 16  Temp: 98.1 F (36.7 C)  TempSrc: Temporal  SpO2: 98%  Weight: 137 lb (62.1 kg)  Height: 5\' 1"  (1.549 m)   Body mass index is 25.89 kg/m.  Advanced Directives 07/14/2020 11/14/2014 12/28/2013  Does Patient Have a Medical Advance Directive? Yes Yes No  Type of 12/30/2013 of [place];Living will Living will;Healthcare Power of Attorney -  Copy of Healthcare Power of Attorney in Chart? No - copy requested - -    Current Medications (verified) Outpatient Encounter Medications as of 07/14/2020  Medication Sig  . atorvastatin (LIPITOR) 20 MG tablet Take 1 tablet by mouth once daily  . BIOTIN 5000 PO Take 1 tablet by mouth daily.   . carvedilol (COREG) 12.5 MG tablet Take 1/2 (one-half) tablet by mouth twice daily  . Cholecalciferol (VITAMIN D) 2000 UNITS CAPS Take 4,000-6,000 Units by mouth daily.   09/13/2020 EPINEPHrine (EPI-PEN) 0.3 mg/0.3 mL DEVI Inject 0.3 mg into the muscle once.  . finasteride (PROSCAR) 5 MG tablet Take 1/2 (one-half) tablet by mouth once daily  . hydrochlorothiazide (HYDRODIURIL) 25 MG tablet Take 1 tablet by mouth once daily  . Magnesium 250 MG TABS Take 1 tablet by mouth 2 (two) times daily.  . mometasone (NASONEX) 50 MCG/ACT nasal spray Place 2 sprays into the nose daily as needed (congestion).  . Multiple Vitamins-Minerals (ZINC PO) Take by mouth.  . Omega-3 Fatty Acids (FISH OIL) 1360 MG CAPS Take by mouth daily.  Marland Kitchen omeprazole (PRILOSEC) 40 MG capsule Take 1 capsule by mouth once daily  . POTASSIUM PO Take 1 capsule by mouth daily. OTC   No facility-administered encounter medications on file as of 07/14/2020.    Allergies  (verified) Sulfa antibiotics   History: Past Medical History:  Diagnosis Date  . ALLERGIC RHINITIS 01/12/2008  . Atrial flutter (HCC)   . Blood transfusion without reported diagnosis 1981   after ectopic pregnancy  . CAROTID BRUITS, BILATERAL 04/01/2009  . Essential hypertension, benign 01/12/2008  . HYPERLIPIDEMIA 05/02/2007  . Rheumatic fever    Past Surgical History:  Procedure Laterality Date  . ABLATION  12-28-13   atrial flutter ablation by Dr 12-30-13  . ATRIAL FLUTTER ABLATION N/A 12/28/2013   Procedure: ATRIAL FLUTTER ABLATION;  Surgeon: 12/30/2013, MD;  Location: [facility] CATH LAB;  Service: Cardiovascular;  Laterality: N/A;  . CARPAL TUNNEL RELEASE Right 2007  . SALPINGECTOMY  1981   Family History  Problem Relation Age of Onset  . Cancer Mother        stomach  . Esophageal cancer Mother 60       per pt esophageal ca at [facility]  . Stroke Father   . Cancer Paternal Aunt        colon  . Colon cancer Paternal Aunt 30  . Rectal cancer Neg Hx   . Stomach cancer Neg Hx    Social History   Socioeconomic History  . Marital status: Married    Spouse name: Not on file  . Number of children: Not on file  . Years of education: Not on file  . Highest education level: Not on file  Occupational  History  . Not on file  Tobacco Use  . Smoking status: Never Smoker  . Smokeless tobacco: Never Used  Substance and Sexual Activity  . Alcohol use: Yes    Alcohol/week: 0.0 standard drinks    Comment: occasionally  . Drug use: Yes    Comment: rare  . Sexual activity: Not on file  Other Topics Concern  . Not on file  Social History Narrative  . Not on file   Social Determinants of Health   Financial Resource Strain: Low Risk   . Difficulty of Paying Living Expenses: Not hard at all  Food Insecurity: No Food Insecurity  . Worried About Programme researcher, broadcasting/film/videounning Out of Food in the Last Year: Never true  . Ran Out of Food in the Last Year: Never true  Transportation Needs: No  Transportation Needs  . Lack of Transportation (Medical): No  . Lack of Transportation (Non-Medical): No  Physical Activity: Sufficiently Active  . Days of Exercise per Week: 7 days  . Minutes of Exercise per Session: 30 min  Stress: No Stress Concern Present  . Feeling of Stress : Not at all  Social Connections: Moderately Integrated  . Frequency of Communication with Friends and Family: More than three times a week  . Frequency of Social Gatherings with Friends and Family: More than three times a week  . Attends Religious Services: More than 4 times per year  . Active Member of Clubs or Organizations: No  . Attends BankerClub or Organization Meetings: Never  . Marital Status: Married    Tobacco Counseling Counseling given: Not Answered   Clinical Intake:  Pre-visit preparation completed: Yes  Pain : No/denies pain     Nutritional Status: BMI 25 -29 Overweight Nutritional Risks: None Diabetes: No  How often do you need to have someone help you when you read instructions, pamphlets, or other written materials from your doctor or pharmacy?: 1 - Never  Diabetic?No  Interpreter Needed?: No  Information entered by :: Thomasenia SalesMartha Aly Hauser LPN   Activities of Daily Living In your present state of health, do you have any difficulty performing the following activities: 07/14/2020 02/18/2020  Hearing? N N  Vision? N N  Difficulty concentrating or making decisions? N N  Walking or climbing stairs? N N  Dressing or bathing? N N  Doing errands, shopping? N N  Preparing Food and eating ? N -  Using the Toilet? N -  In the past six months, have you accidently leaked urine? N -  Do you have problems with loss of bowel control? N -  Managing your Medications? N -  Managing your Finances? N -  Housekeeping or managing your Housekeeping? N -  Some recent data might be hidden    Patient Care Team: Sheliah Hatchabori, Katherine E, MD as PCP - General (Family Medicine) Coralie CarpenGreenberg, Brent, MD as  Consulting Physician (Family Medicine) Bradly Bienenstockrtmann, Fred, MD as Consulting Physician (Orthopedic Surgery) Marlow Baarslark, Dyanna, MD as Consulting Physician (Obstetrics)  Indicate any recent Medical Services you may have received from other than [facility] providers in the past year (date may be approximate).     Assessment:   This is a routine wellness examination for Braeleigh.  Hearing/Vision screen  Hearing Screening   125Hz  250Hz  500Hz  1000Hz  2000Hz  3000Hz  4000Hz  6000Hz  8000Hz   Right ear:           Left ear:           Comments: No issues  Vision Screening Comments: Reading glasses Last eye exam-2020-[facility]  Dietary issues and exercise activities discussed: Current Exercise Habits: Home exercise routine, Type of exercise: walking, Time (Minutes): 30, Frequency (Times/Week): 7, Weekly Exercise (Minutes/Week): 210, Exercise limited by: None identified  Goals Addressed            This Visit's Progress   . Patient Stated       Increase activity & drink more water      Depression Screen PHQ 2/9 Scores 07/14/2020 02/18/2020 07/30/2019 01/29/2019 07/13/2018 01/12/2018 03/16/2017  PHQ - 2 Score 0 0 0 0 0 0 0  PHQ- 9 Score - 0 0 0 0 0 0    Fall Risk Fall Risk  07/14/2020 02/18/2020 07/30/2019 01/29/2019 01/29/2019  Falls in the past year? 0 0 0 1 1  Number falls in past yr: 0 0 0 0 0  Injury with Fall? 0 0 0 0 0  Risk for fall due to : - No Fall Risks - - -  Follow up Falls prevention discussed - Falls evaluation completed Falls evaluation completed Falls evaluation completed    FALL RISK PREVENTION PERTAINING TO THE HOME:  Any stairs in or around the home? Yes  If so, are there any without handrails? No  Home free of loose throw rugs in walkways, pet beds, electrical cords, etc? Yes  Adequate lighting in your home to reduce risk of falls? Yes   ASSISTIVE DEVICES UTILIZED TO PREVENT FALLS:  Life alert? No  Use of a cane, walker or w/c? No  Grab bars in the bathroom? No  Shower  chair or bench in shower? No  Elevated toilet seat or a handicapped toilet? No   TIMED UP AND GO:  Was the test performed? Yes .  Length of time to ambulate 10 feet: 10 sec.   Gait steady and fast without use of assistive device  Cognitive Function:Normal cognitive status assessed by direct observation by this Nurse Health Advisor. No abnormalities found.       6CIT Screen 07/14/2020  What Year? 0 points  What month? 0 points  What time? 0 points  Count back from 20 0 points  Months in reverse 0 points  Repeat phrase 0 points  Total Score 0    Immunizations Immunization History  Administered Date(s) Administered  . Influenza Split 02/05/2013  . Influenza, High Dose Seasonal PF 12/21/2019  . Influenza, Quadrivalent, Recombinant, Inj, Pf 12/21/2017, 12/20/2018  . Influenza,inj,Quad PF,6+ Mos 01/14/2017  . Influenza-Unspecified 12/06/2013, 02/05/2014, 12/07/2014  . PFIZER(Purple Top)SARS-COV-2 Vaccination 04/12/2019, 05/08/2019  . Pneumococcal Conjugate-13 09/03/2014  . Pneumococcal Polysaccharide-23 09/15/2015  . Tdap 08/07/2013  . Zoster 09/04/2013  . Zoster Recombinat (Shingrix) 10/30/2018, 01/10/2019    TDAP status: Up to date  Flu Vaccine status: Up to date  Pneumococcal vaccine status: Up to date  Covid-19 vaccine status: Information provided on how to obtain vaccines. booster due  Qualifies for Shingles Vaccine? No   Zostavax completed Yes   Shingrix Completed?: Yes  Screening Tests Health Maintenance  Topic Date Due  . COVID-19 Vaccine (3 - Booster for Pfizer series) 11/08/2019  . Hepatitis C Screening  07/29/2020 (Originally 09/12/1968)  . INFLUENZA VACCINE  10/06/2020  . MAMMOGRAM  11/07/2020  . TETANUS/TDAP  08/08/2023  . COLONOSCOPY (Pts 45-88yrs Insurance coverage will need to be confirmed)  11/27/2024  . DEXA SCAN  Completed  . PNA vac Low Risk Adult  Completed  . HPV VACCINES  Aged Out    Health Maintenance  Health Maintenance Due  Topic  Date  Due  . COVID-19 Vaccine (3 - Booster for Pfizer series) 11/08/2019    Colorectal cancer screening: Type of screening: Colonoscopy. Completed 11/28/2014. Repeat every 10 years  Mammogram status: Completed Bilateral 11/08/2019. Repeat every year  Bone Density status: Completed 04/07/2020. Results reflect: Bone density results: OSTEOPENIA. Repeat every 2 years.  Lung Cancer Screening: (Low Dose CT Chest recommended if Age 78-80 years, 30 pack-year currently smoking OR have quit w/in 15years.) does not qualify.    Additional Screening:  Hepatitis C Screening: does qualify; Declined  Vision Screening: Recommended annual ophthalmology exams for early detection of glaucoma and other disorders of the eye. Is the patient up to date with their annual eye exam?  No  Who is the provider or what is the name of the office in which the patient attends annual eye exams? [facility] Patient advised to make an appt  Dental Screening: Recommended annual dental exams for proper oral hygiene  Community Resource Referral / Chronic Care Management: CRR required this visit?  No   CCM required this visit?  No      Plan:     I have personally reviewed and noted the following in the patient's chart:   . Medical and social history . Use of alcohol, tobacco or illicit drugs  . Current medications and supplements including opioid prescriptions.  . Functional ability and status . Nutritional status . Physical activity . Advanced directives . List of other physicians . Hospitalizations, surgeries, and ER visits in previous 12 months . Vitals . Screenings to include cognitive, depression, and falls . Referrals and appointments  In addition, I have reviewed and discussed with patient certain preventive protocols, quality metrics, and best practice recommendations. A written personalized care plan for preventive services as well as general preventive health recommendations were provided to  patient.   Patient to access avs on mychart  Roanna Raider, [place]   05/11/91  Nurse Health Advisor  Nurse Notes: None

## 2020-07-21 DIAGNOSIS — E8581 Light chain (AL) amyloidosis: Secondary | ICD-10-CM | POA: Diagnosis not present

## 2020-07-21 DIAGNOSIS — E859 Amyloidosis, unspecified: Secondary | ICD-10-CM | POA: Diagnosis not present

## 2020-07-21 DIAGNOSIS — G5602 Carpal tunnel syndrome, left upper limb: Secondary | ICD-10-CM | POA: Diagnosis not present

## 2020-08-06 ENCOUNTER — Other Ambulatory Visit: Payer: Self-pay

## 2020-08-06 DIAGNOSIS — E785 Hyperlipidemia, unspecified: Secondary | ICD-10-CM

## 2020-08-06 MED ORDER — ATORVASTATIN CALCIUM 20 MG PO TABS: 1.0000 | ORAL_TABLET | Freq: Every day | ORAL | 0 refills | Status: DC

## 2020-08-15 ENCOUNTER — Telehealth: Payer: Self-pay | Admitting: Family Medicine

## 2020-08-15 NOTE — Progress Notes (Signed)
  Chronic Care Management   Outreach Note  08/15/2020 Name: Chelsea Morgan MRN: [id number] DOB: 12/15/1950  Referred by: Sheliah Hatch, MD Reason for referral : No chief complaint on file.   An unsuccessful telephone outreach was attempted today. The patient was referred to the pharmacist for assistance with care management and care coordination.   Follow Up Plan:   Carmell Austria Upstream Scheduler

## 2020-08-18 ENCOUNTER — Encounter: Payer: Self-pay | Admitting: Family Medicine

## 2020-08-18 ENCOUNTER — Ambulatory Visit (INDEPENDENT_AMBULATORY_CARE_PROVIDER_SITE_OTHER): Payer: Medicare HMO | Admitting: Family Medicine

## 2020-08-18 ENCOUNTER — Other Ambulatory Visit: Payer: Self-pay

## 2020-08-18 VITALS — BP 116/80 | HR 78 | Temp 99.3°F | Resp 19 | Ht 61.0 in | Wt 134.0 lb

## 2020-08-18 DIAGNOSIS — M545 Low back pain, unspecified: Secondary | ICD-10-CM

## 2020-08-18 DIAGNOSIS — E859 Amyloidosis, unspecified: Secondary | ICD-10-CM

## 2020-08-18 DIAGNOSIS — I480 Paroxysmal atrial fibrillation: Secondary | ICD-10-CM

## 2020-08-18 DIAGNOSIS — M8580 Other specified disorders of bone density and structure, unspecified site: Secondary | ICD-10-CM | POA: Diagnosis not present

## 2020-08-18 DIAGNOSIS — Z1159 Encounter for screening for other viral diseases: Secondary | ICD-10-CM

## 2020-08-18 DIAGNOSIS — E785 Hyperlipidemia, unspecified: Secondary | ICD-10-CM

## 2020-08-18 DIAGNOSIS — G8929 Other chronic pain: Secondary | ICD-10-CM

## 2020-08-18 DIAGNOSIS — M542 Cervicalgia: Secondary | ICD-10-CM

## 2020-08-18 DIAGNOSIS — I10 Essential (primary) hypertension: Secondary | ICD-10-CM

## 2020-08-18 LAB — LIPID PANEL
Cholesterol: 155 mg/dL (ref 0–200)
HDL: 53.9 mg/dL (ref >39.00)
LDL Cholesterol: 81 mg/dL (ref 0–99)
NonHDL: 100.77
Total CHOL/HDL Ratio: 3
Triglycerides: 101 mg/dL (ref 0.0–149.0)
VLDL: 20.2 mg/dL (ref 0.0–40.0)

## 2020-08-18 LAB — BASIC METABOLIC PANEL
BUN: 14 mg/dL (ref 6–23)
CO2: 29 mEq/L (ref 19–32)
Calcium: 9.7 mg/dL (ref 8.4–10.5)
Chloride: 102 mEq/L (ref 96–112)
Creatinine, Ser: 0.74 mg/dL (ref 0.40–1.20)
GFR: 81.11 mL/min (ref >60.00)
Glucose, Bld: 91 mg/dL (ref 70–99)
Potassium: 3.8 mEq/L (ref 3.5–5.1)
Sodium: 141 mEq/L (ref 135–145)

## 2020-08-18 LAB — HEPATIC FUNCTION PANEL
ALT: 16 U/L (ref 0–35)
AST: 15 U/L (ref 0–37)
Albumin: 4.4 g/dL (ref 3.5–5.2)
Alkaline Phosphatase: 77 U/L (ref 39–117)
Bilirubin, Direct: 0.3 mg/dL (ref 0.0–0.3)
Total Bilirubin: 2.2 mg/dL — ABNORMAL HIGH (ref 0.2–1.2)
Total Protein: 6.9 g/dL (ref 6.0–8.3)

## 2020-08-18 LAB — CBC WITH DIFFERENTIAL/PLATELET
Basophils Absolute: 0 10*3/uL (ref 0.0–0.1)
Basophils Relative: 0.9 % (ref 0.0–3.0)
Eosinophils Absolute: 0.2 10*3/uL (ref 0.0–0.7)
Eosinophils Relative: 3.6 % (ref 0.0–5.0)
HCT: 43 % (ref 36.0–46.0)
Hemoglobin: 14.2 g/dL (ref 12.0–15.0)
Lymphocytes Relative: 26.7 % (ref 12.0–46.0)
Lymphs Abs: 1.2 10*3/uL (ref 0.7–4.0)
MCHC: 33.1 g/dL (ref 30.0–36.0)
MCV: 89 fl (ref 78.0–100.0)
Monocytes Absolute: 0.4 10*3/uL (ref 0.1–1.0)
Monocytes Relative: 7.7 % (ref 3.0–12.0)
Neutro Abs: 2.8 10*3/uL (ref 1.4–7.7)
Neutrophils Relative %: 61.1 % (ref 43.0–77.0)
Platelets: 240 10*3/uL (ref 150.0–400.0)
RBC: 4.83 Mil/uL (ref 3.87–5.11)
RDW: 13.9 % (ref 11.5–15.5)
WBC: 4.6 10*3/uL (ref 4.0–10.5)

## 2020-08-18 LAB — TSH: TSH: 1.85 u[IU]/mL (ref 0.35–4.50)

## 2020-08-18 LAB — VITAMIN D 25 HYDROXY (VIT D DEFICIENCY, FRACTURES): VITD: 119.42 ng/mL (ref 30.00–100.00)

## 2020-08-18 MED ORDER — PREDNISONE 10 MG PO TABS: ORAL_TABLET | ORAL | 0 refills | Status: DC

## 2020-08-18 NOTE — Assessment & Plan Note (Signed)
Ongoing issue for pt.  Following w/ [facility].  Currently no evidence of active disease

## 2020-08-18 NOTE — Assessment & Plan Note (Signed)
Ongoing issue for pt.  Rate controlled on Coreg.  Following w/ [facility].

## 2020-08-18 NOTE — Progress Notes (Signed)
   Subjective:    Patient ID: Chelsea Morgan, female    DOB: October 17, 1950, 70 y.o.   MRN: [id number]  HPI Hyperlipidemia- chronic problem, on Lipitor 10mg  daily.  No abd pain, N/V  HTN- chronic problem, on Coreg 6.25mg  BID, HCTZ 25mg  daily w/ good control.  No CP, SOB, HAs, edema.  Osteopenia- noted on DEXA done 04/07/20.  Due for Vit D level  Neck/back pain- pt reports that she will turn her head or flex her neck 'and it feels like sand paper'.  Also has upper and lower back pain.  Reports she is very stiff in the AM when she wakes.  'once I get going I'm ok'.  Pain is primarily along lower back but will have pain in upper back and along bra area.  'it's been going on for months'.     Review of Systems For ROS see HPI   This visit occurred during the SARS-CoV-2 public health emergency.  Safety protocols were in place, including screening questions prior to the visit, additional usage of staff PPE, and extensive cleaning of exam room while observing appropriate contact time as indicated for disinfecting solutions.      Objective:   Physical Exam Vitals reviewed.  Constitutional:      General: She is not in acute distress.    Appearance: Normal appearance. She is well-developed. She is obese.  HENT:     Head: Normocephalic and atraumatic.  Eyes:     Conjunctiva/sclera: Conjunctivae normal.     Pupils: Pupils are equal, round, and reactive to light.  Neck:     Thyroid: No thyromegaly.  Cardiovascular:     Rate and Rhythm: Normal rate. Rhythm irregular.     Pulses: Normal pulses.     Heart sounds: Normal heart sounds. No murmur heard. Pulmonary:     Effort: Pulmonary effort is normal. No respiratory distress.     Breath sounds: Normal breath sounds.  Abdominal:     General: There is no distension.     Palpations: Abdomen is soft.     Tenderness: There is no abdominal tenderness.  Musculoskeletal:     Cervical back: Normal range of motion and neck supple.     Right lower leg: No  edema.     Left lower leg: No edema.  Lymphadenopathy:     Cervical: No cervical adenopathy.  Skin:    General: Skin is warm and dry.  Neurological:     Mental Status: She is alert and oriented to person, place, and time.  Psychiatric:        Behavior: Behavior normal.          Assessment & Plan:  Neck/back pain- new to provider, ongoing for pt.  Suspect age related degenerative change.  Discussed NSAIDs/steroids, PT, Sports Med.  Pt would like to try Prednisone taper and see if things improve prior to referrals.  If no improvement, would like to see Sports Med.  Will follow.

## 2020-08-18 NOTE — Patient Instructions (Signed)
Schedule your complete physical in 6 months We'll notify you of your lab results and make any changes if needed Keep up the good work on healthy diet and regular exercise- you look great! Take the Prednisone as directed- take w/ food. If no improvement in back/neck pain- please let me know and we can refer to Sports Med Call with any questions or concerns Stay Safe!  Stay Healthy!

## 2020-08-18 NOTE — Assessment & Plan Note (Signed)
Noted on DEXA done Jan 2022.  Check Vit D level and replete prn.

## 2020-08-18 NOTE — Assessment & Plan Note (Signed)
Chronic problem.  Well controlled on Coreg and HCTZ.  Currently asymptomatic.  Check labs but no anticipated med changes.  Will follow.

## 2020-08-18 NOTE — Assessment & Plan Note (Signed)
Chronic problem.  On Lipitor 10mg  nightly w/o difficulty.  Check labs.  Adjust meds prn

## 2020-08-19 ENCOUNTER — Other Ambulatory Visit: Payer: Self-pay

## 2020-08-19 DIAGNOSIS — R7989 Other specified abnormal findings of blood chemistry: Secondary | ICD-10-CM

## 2020-08-19 LAB — HEPATITIS C ANTIBODY
Hepatitis C Ab: NONREACTIVE
SIGNAL TO CUT-OFF: 0.01 (ref <1.00)

## 2020-08-22 ENCOUNTER — Telehealth: Payer: Self-pay | Admitting: Family Medicine

## 2020-08-22 NOTE — Chronic Care Management (AMB) (Signed)
  Chronic Care Management   Outreach Note  08/22/2020 Name: KAMIL HANIGAN MRN: [id number] DOB: Sep 04, 1950  Referred by: Sheliah Hatch, MD Reason for referral : No chief complaint on file.   A second unsuccessful telephone outreach was attempted today. The patient was referred to pharmacist for assistance with care management and care coordination.  Follow Up Plan:   Carmell Austria Upstream Scheduler

## 2020-08-28 ENCOUNTER — Other Ambulatory Visit: Payer: Self-pay | Admitting: Family Medicine

## 2020-08-28 DIAGNOSIS — I4892 Unspecified atrial flutter: Secondary | ICD-10-CM

## 2020-08-28 DIAGNOSIS — L659 Nonscarring hair loss, unspecified: Secondary | ICD-10-CM

## 2020-08-28 DIAGNOSIS — I10 Essential (primary) hypertension: Secondary | ICD-10-CM

## 2020-09-01 ENCOUNTER — Telehealth: Payer: Self-pay | Admitting: Family Medicine

## 2020-09-01 NOTE — Chronic Care Management (AMB) (Signed)
  Chronic Care Management   Outreach Note  09/01/2020 Name: Chelsea Morgan MRN: [id number] DOB: Jul 16, 1950  Referred by: Sheliah Hatch, MD Reason for referral : No chief complaint on file.   Third unsuccessful telephone outreach was attempted today. The patient was referred to the pharmacist for assistance with care management and care coordination.   Follow Up Plan:   Carmell Austria Upstream Scheduler

## 2020-09-03 ENCOUNTER — Encounter: Payer: Self-pay | Admitting: *Deleted

## 2020-09-05 ENCOUNTER — Other Ambulatory Visit: Payer: Medicare HMO

## 2020-09-09 ENCOUNTER — Other Ambulatory Visit: Payer: Self-pay

## 2020-09-09 ENCOUNTER — Other Ambulatory Visit (INDEPENDENT_AMBULATORY_CARE_PROVIDER_SITE_OTHER): Payer: Medicare HMO

## 2020-09-09 DIAGNOSIS — R7989 Other specified abnormal findings of blood chemistry: Secondary | ICD-10-CM | POA: Diagnosis not present

## 2020-09-09 LAB — VITAMIN D 25 HYDROXY (VIT D DEFICIENCY, FRACTURES): VITD: 89.77 ng/mL (ref 30.00–100.00)

## 2020-10-13 DIAGNOSIS — H16223 Keratoconjunctivitis sicca, not specified as Sjogren's, bilateral: Secondary | ICD-10-CM | POA: Diagnosis not present

## 2020-10-13 DIAGNOSIS — H2513 Age-related nuclear cataract, bilateral: Secondary | ICD-10-CM | POA: Diagnosis not present

## 2020-10-30 DIAGNOSIS — G5762 Lesion of plantar nerve, left lower limb: Secondary | ICD-10-CM | POA: Diagnosis not present

## 2020-10-30 DIAGNOSIS — M19072 Primary osteoarthritis, left ankle and foot: Secondary | ICD-10-CM | POA: Diagnosis not present

## 2020-11-11 DIAGNOSIS — Z7689 Persons encountering health services in other specified circumstances: Secondary | ICD-10-CM | POA: Diagnosis not present

## 2020-11-11 DIAGNOSIS — Z1231 Encounter for screening mammogram for malignant neoplasm of breast: Secondary | ICD-10-CM | POA: Diagnosis not present

## 2020-11-26 ENCOUNTER — Other Ambulatory Visit: Payer: Self-pay | Admitting: Family Medicine

## 2020-11-26 DIAGNOSIS — L659 Nonscarring hair loss, unspecified: Secondary | ICD-10-CM

## 2020-11-26 DIAGNOSIS — I4892 Unspecified atrial flutter: Secondary | ICD-10-CM

## 2020-11-26 DIAGNOSIS — I10 Essential (primary) hypertension: Secondary | ICD-10-CM

## 2020-12-01 ENCOUNTER — Other Ambulatory Visit: Payer: Self-pay | Admitting: Family Medicine

## 2020-12-01 DIAGNOSIS — I10 Essential (primary) hypertension: Secondary | ICD-10-CM

## 2020-12-01 DIAGNOSIS — L659 Nonscarring hair loss, unspecified: Secondary | ICD-10-CM

## 2020-12-01 DIAGNOSIS — I4892 Unspecified atrial flutter: Secondary | ICD-10-CM

## 2020-12-26 ENCOUNTER — Telehealth: Payer: Self-pay | Admitting: *Deleted

## 2020-12-26 NOTE — Chronic Care Management (AMB) (Signed)
  Chronic Care Management   Outreach Note  12/26/2020 Name: Chelsea Morgan MRN: [id number] DOB: 1950-08-24  Chelsea Morgan is a 70 y.o. year old female who is a primary care patient of Tabori, Helane Rima, MD. I reached out to Chelsea Morgan by phone today in response to a referral sent by Chelsea Morgan's primary care provider.  An unsuccessful telephone outreach was attempted today. The patient was referred to the case management team for assistance with care management and care coordination.   Follow Up Plan: A HIPAA compliant phone message was left for the patient providing contact information and requesting a return call. The care management team will reach out to the patient again over the next 7 days.  If patient returns call to provider office, please advise to call Embedded Care Management Care Guide Chelsea Morgan at [phone].  Gwenevere Ghazi  Care Guide, Embedded Care Coordination [facility] Management  Direct Dial: [phone]

## 2021-01-02 NOTE — Chronic Care Management (AMB) (Signed)
  Chronic Care Management   Outreach Note  01/02/2021 Name: Chelsea Morgan MRN: [id number] DOB: 13-Jun-1950  Chelsea Morgan is a 70 y.o. year old female who is a primary care patient of Tabori, Helane Rima, MD. I reached out to Chelsea Morgan by phone today in response to a referral sent by Chelsea Morgan's primary care provider.  A second telephone outreach was attempted today. The patient was referred to the case management team for assistance with care management and care coordination.   Follow Up Plan: The care management team will reach out to the patient again over the next 7 days. If patient returns call to provider office, please advise to call Embedded Care Management Care Guide Chelsea Morgan at [phone].  Gwenevere Ghazi  Care Guide, Embedded Care Coordination [facility] Management  Direct Dial: [phone]

## 2021-01-05 DIAGNOSIS — H02831 Dermatochalasis of right upper eyelid: Secondary | ICD-10-CM | POA: Diagnosis not present

## 2021-01-05 DIAGNOSIS — H527 Unspecified disorder of refraction: Secondary | ICD-10-CM | POA: Diagnosis not present

## 2021-01-05 DIAGNOSIS — H02834 Dermatochalasis of left upper eyelid: Secondary | ICD-10-CM | POA: Diagnosis not present

## 2021-01-05 DIAGNOSIS — H16223 Keratoconjunctivitis sicca, not specified as Sjogren's, bilateral: Secondary | ICD-10-CM | POA: Diagnosis not present

## 2021-01-05 DIAGNOSIS — H52223 Regular astigmatism, bilateral: Secondary | ICD-10-CM | POA: Diagnosis not present

## 2021-01-05 DIAGNOSIS — H57813 Brow ptosis, bilateral: Secondary | ICD-10-CM | POA: Diagnosis not present

## 2021-01-05 DIAGNOSIS — H25813 Combined forms of age-related cataract, bilateral: Secondary | ICD-10-CM | POA: Diagnosis not present

## 2021-01-09 NOTE — Chronic Care Management (AMB) (Signed)
  Chronic Care Management   Outreach Note  01/09/2021 Name: Chelsea Morgan MRN: [id number] DOB: 10/09/1950  Chelsea Morgan is a 70 y.o. year old female who is a primary care patient of Tabori, Helane Rima, MD. I reached out to Chelsea Morgan by phone today in response to a referral sent by Chelsea Morgan's primary care provider.  Third unsuccessful telephone outreach was attempted today. The patient was referred to the case management team for assistance with care management and care coordination. The patient's primary care provider has been notified of our unsuccessful attempts to make or maintain contact with the patient. The care management team is pleased to engage with this patient at any time in the future should Chelsea Morgan be interested in assistance from the care management team.   Follow Up Plan: We have been unable to make contact with the patient. The care management team is available to follow up with the patient after provider conversation with the patient regarding recommendation for care management engagement and subsequent re-referral to the care management team.  A HIPAA compliant phone message was left for the patient providing contact information and requesting a return call.   Chelsea Morgan  Care Guide, Embedded Care Coordination [facility] Management  Direct Dial: [phone]

## 2021-01-19 DIAGNOSIS — H25813 Combined forms of age-related cataract, bilateral: Secondary | ICD-10-CM | POA: Diagnosis not present

## 2021-01-20 DIAGNOSIS — G5602 Carpal tunnel syndrome, left upper limb: Secondary | ICD-10-CM | POA: Diagnosis not present

## 2021-01-20 DIAGNOSIS — E8581 Light chain (AL) amyloidosis: Secondary | ICD-10-CM | POA: Diagnosis not present

## 2021-02-03 DIAGNOSIS — H2513 Age-related nuclear cataract, bilateral: Secondary | ICD-10-CM | POA: Diagnosis not present

## 2021-02-03 DIAGNOSIS — M3501 Sicca syndrome with keratoconjunctivitis: Secondary | ICD-10-CM | POA: Diagnosis not present

## 2021-02-03 DIAGNOSIS — H25813 Combined forms of age-related cataract, bilateral: Secondary | ICD-10-CM | POA: Diagnosis not present

## 2021-02-03 DIAGNOSIS — H02831 Dermatochalasis of right upper eyelid: Secondary | ICD-10-CM | POA: Diagnosis not present

## 2021-02-03 DIAGNOSIS — H25812 Combined forms of age-related cataract, left eye: Secondary | ICD-10-CM | POA: Diagnosis not present

## 2021-02-03 DIAGNOSIS — I10 Essential (primary) hypertension: Secondary | ICD-10-CM | POA: Diagnosis not present

## 2021-02-03 DIAGNOSIS — E785 Hyperlipidemia, unspecified: Secondary | ICD-10-CM | POA: Diagnosis not present

## 2021-02-03 DIAGNOSIS — Z79899 Other long term (current) drug therapy: Secondary | ICD-10-CM | POA: Diagnosis not present

## 2021-02-03 DIAGNOSIS — I4891 Unspecified atrial fibrillation: Secondary | ICD-10-CM | POA: Diagnosis not present

## 2021-02-03 DIAGNOSIS — H02834 Dermatochalasis of left upper eyelid: Secondary | ICD-10-CM | POA: Diagnosis not present

## 2021-02-03 DIAGNOSIS — H52223 Regular astigmatism, bilateral: Secondary | ICD-10-CM | POA: Diagnosis not present

## 2021-02-10 DIAGNOSIS — H52223 Regular astigmatism, bilateral: Secondary | ICD-10-CM | POA: Diagnosis not present

## 2021-02-10 DIAGNOSIS — H02831 Dermatochalasis of right upper eyelid: Secondary | ICD-10-CM | POA: Diagnosis not present

## 2021-02-10 DIAGNOSIS — I4891 Unspecified atrial fibrillation: Secondary | ICD-10-CM | POA: Diagnosis not present

## 2021-02-10 DIAGNOSIS — Z79899 Other long term (current) drug therapy: Secondary | ICD-10-CM | POA: Diagnosis not present

## 2021-02-10 DIAGNOSIS — H25811 Combined forms of age-related cataract, right eye: Secondary | ICD-10-CM | POA: Diagnosis not present

## 2021-02-10 DIAGNOSIS — M3501 Sicca syndrome with keratoconjunctivitis: Secondary | ICD-10-CM | POA: Diagnosis not present

## 2021-02-10 DIAGNOSIS — H25813 Combined forms of age-related cataract, bilateral: Secondary | ICD-10-CM | POA: Diagnosis not present

## 2021-02-10 DIAGNOSIS — I10 Essential (primary) hypertension: Secondary | ICD-10-CM | POA: Diagnosis not present

## 2021-02-10 DIAGNOSIS — H02834 Dermatochalasis of left upper eyelid: Secondary | ICD-10-CM | POA: Diagnosis not present

## 2021-02-10 DIAGNOSIS — H2511 Age-related nuclear cataract, right eye: Secondary | ICD-10-CM | POA: Diagnosis not present

## 2021-02-10 DIAGNOSIS — H2513 Age-related nuclear cataract, bilateral: Secondary | ICD-10-CM | POA: Diagnosis not present

## 2021-02-10 DIAGNOSIS — E785 Hyperlipidemia, unspecified: Secondary | ICD-10-CM | POA: Diagnosis not present

## 2021-02-22 ENCOUNTER — Other Ambulatory Visit: Payer: Self-pay | Admitting: Family Medicine

## 2021-02-22 DIAGNOSIS — E785 Hyperlipidemia, unspecified: Secondary | ICD-10-CM

## 2021-02-22 DIAGNOSIS — I4892 Unspecified atrial flutter: Secondary | ICD-10-CM

## 2021-02-22 DIAGNOSIS — I10 Essential (primary) hypertension: Secondary | ICD-10-CM

## 2021-02-22 DIAGNOSIS — L659 Nonscarring hair loss, unspecified: Secondary | ICD-10-CM

## 2021-02-23 ENCOUNTER — Ambulatory Visit (INDEPENDENT_AMBULATORY_CARE_PROVIDER_SITE_OTHER): Payer: Medicare HMO | Admitting: Family Medicine

## 2021-02-23 ENCOUNTER — Encounter: Payer: Self-pay | Admitting: Family Medicine

## 2021-02-23 VITALS — BP 122/80 | HR 70 | Temp 98.2°F | Resp 16 | Wt 132.6 lb

## 2021-02-23 DIAGNOSIS — R5383 Other fatigue: Secondary | ICD-10-CM

## 2021-02-23 DIAGNOSIS — Z0000 Encounter for general adult medical examination without abnormal findings: Secondary | ICD-10-CM

## 2021-02-23 DIAGNOSIS — M8580 Other specified disorders of bone density and structure, unspecified site: Secondary | ICD-10-CM | POA: Diagnosis not present

## 2021-02-23 DIAGNOSIS — I10 Essential (primary) hypertension: Secondary | ICD-10-CM

## 2021-02-23 LAB — CBC WITH DIFFERENTIAL/PLATELET
Basophils Absolute: 0 10*3/uL (ref 0.0–0.1)
Basophils Relative: 1 % (ref 0.0–3.0)
Eosinophils Absolute: 0.2 10*3/uL (ref 0.0–0.7)
Eosinophils Relative: 4.3 % (ref 0.0–5.0)
HCT: 44.3 % (ref 36.0–46.0)
Hemoglobin: 14.3 g/dL (ref 12.0–15.0)
Lymphocytes Relative: 29 % (ref 12.0–46.0)
Lymphs Abs: 1.2 10*3/uL (ref 0.7–4.0)
MCHC: 32.2 g/dL (ref 30.0–36.0)
MCV: 91.8 fl (ref 78.0–100.0)
Monocytes Absolute: 0.4 10*3/uL (ref 0.1–1.0)
Monocytes Relative: 9.3 % (ref 3.0–12.0)
Neutro Abs: 2.3 10*3/uL (ref 1.4–7.7)
Neutrophils Relative %: 56.4 % (ref 43.0–77.0)
Platelets: 232 10*3/uL (ref 150.0–400.0)
RBC: 4.82 Mil/uL (ref 3.87–5.11)
RDW: 13.9 % (ref 11.5–15.5)
WBC: 4 10*3/uL (ref 4.0–10.5)

## 2021-02-23 LAB — TSH: TSH: 1.55 u[IU]/mL (ref 0.35–5.50)

## 2021-02-23 NOTE — Assessment & Plan Note (Signed)
Check Vit D level and replete prn. 

## 2021-02-23 NOTE — Patient Instructions (Addendum)
Follow up in 6 months to recheck BP and cholesterol We'll notify you of your lab results and make any changes if needed Keep up the good work on healthy diet and regular exercise- you look great! Restart daily allergy medication to help w/ itchy ears Call with any questions or concerns Stay Safe!  Stay Healthy! Happy Holidays!!

## 2021-02-23 NOTE — Progress Notes (Signed)
° °  Subjective:    Patient ID: Chelsea Morgan, female    DOB: 10-06-1950, 70 y.o.   MRN: [id number]  HPI CPE- UTD on colonoscopy, mammo, DEXA, Tdap, flu, PNA vaccines.  Patient Care Team    Relationship Specialty Notifications Start End  Sheliah Hatch, MD PCP - General Family Medicine  09/15/16   Coralie Carpen, MD Consulting Physician Family Medicine  01/29/19   Bradly Bienenstock, MD Consulting Physician Orthopedic Surgery  01/29/19   Marlow Baars, MD Consulting Physician Obstetrics  01/29/19     Health Maintenance  Topic Date Due   COVID-19 Vaccine (5 - Booster) 07/03/2019   MAMMOGRAM  11/11/2021   TETANUS/TDAP  08/08/2023   COLONOSCOPY (Pts 45-34yrs Insurance coverage will need to be confirmed)  11/27/2024   Pneumonia Vaccine 54+ Years old  Completed   INFLUENZA VACCINE  Completed   DEXA SCAN  Completed   Hepatitis C Screening  Completed   Zoster Vaccines- Shingrix  Completed   HPV VACCINES  Aged Out      Review of Systems Patient reports no vision/ hearing changes, adenopathy,fever, weight change, swallowing issues, chest pain, palpitations, edema, persistant/recurrent cough, hemoptysis, dyspnea (rest/exertional/paroxysmal nocturnal), gastrointestinal bleeding (melena, rectal bleeding), abdominal pain, significant heartburn, bowel changes, GU symptoms (dysuria, hematuria, incontinence), Gyn symptoms (abnormal  bleeding, pain),  syncope, focal weakness, memory loss, numbness & tingling, skin/hair/nail changes, abnormal bruising or bleeding, anxiety, or depression.   This visit occurred during the SARS-CoV-2 public health emergency.  Safety protocols were in place, including screening questions prior to the visit, additional usage of staff PPE, and extensive cleaning of exam room while observing appropriate contact time as indicated for disinfecting solutions.      Objective:   Physical Exam General Appearance:    Alert, cooperative, no distress, appears stated age  Head:     Normocephalic, without obvious abnormality, atraumatic  Eyes:    PERRL, conjunctiva/corneas clear, EOM's intact, fundi    benign, both eyes  Ears:    Normal TM's and external ear canals, both ears  Nose:   Deferred due to COVID  Throat:   Neck:   Supple, symmetrical, trachea midline, no adenopathy;    Thyroid: no enlargement/tenderness/nodules  Back:     Symmetric, no curvature, ROM normal, no CVA tenderness  Lungs:     Clear to auscultation bilaterally, respirations unlabored  Chest Wall:    No tenderness or deformity   Heart:    Irregularly irregular  Breast Exam:    Deferred to mammo  Abdomen:     Soft, non-tender, bowel sounds active all four quadrants,    no masses, no organomegaly  Genitalia:    Deferred  Rectal:    Extremities:   Extremities normal, atraumatic, no cyanosis or edema  Pulses:   2+ and symmetric all extremities  Skin:   Skin color, texture, turgor normal, no rashes or lesions  Lymph nodes:   Cervical, supraclavicular, and axillary nodes normal  Neurologic:   CNII-XII intact, normal strength, sensation and reflexes    throughout          Assessment & Plan:

## 2021-02-23 NOTE — Assessment & Plan Note (Signed)
Chronic problem.  Excellent control and asymptomatic.  Check labs.  No anticipated med changes.  Will follow.

## 2021-02-23 NOTE — Assessment & Plan Note (Signed)
Pt's PE WNL.  UTD on mammo, colonoscopy, immunizations.  Check labs.  Anticipatory guidance provided.  

## 2021-02-24 ENCOUNTER — Other Ambulatory Visit: Payer: Self-pay | Admitting: Family Medicine

## 2021-02-24 DIAGNOSIS — I10 Essential (primary) hypertension: Secondary | ICD-10-CM

## 2021-02-24 DIAGNOSIS — L659 Nonscarring hair loss, unspecified: Secondary | ICD-10-CM

## 2021-02-24 DIAGNOSIS — I4892 Unspecified atrial flutter: Secondary | ICD-10-CM

## 2021-02-24 LAB — BASIC METABOLIC PANEL
BUN: 16 mg/dL (ref 6–23)
CO2: 26 mEq/L (ref 19–32)
Calcium: 9.9 mg/dL (ref 8.4–10.5)
Chloride: 101 mEq/L (ref 96–112)
Creatinine, Ser: 0.75 mg/dL (ref 0.40–1.20)
GFR: 79.52 mL/min (ref >60.00)
Glucose, Bld: 93 mg/dL (ref 70–99)
Potassium: 3.9 mEq/L (ref 3.5–5.1)
Sodium: 140 mEq/L (ref 135–145)

## 2021-02-24 LAB — B12 AND FOLATE PANEL
Folate: >23.4 ng/mL (ref >5.9)
Vitamin B-12: >1550 pg/mL — ABNORMAL HIGH (ref 211–911)

## 2021-02-24 LAB — LIPID PANEL
Cholesterol: 178 mg/dL (ref 0–200)
HDL: 63.2 mg/dL (ref >39.00)
LDL Cholesterol: 101 mg/dL — ABNORMAL HIGH (ref 0–99)
NonHDL: 115.15
Total CHOL/HDL Ratio: 3
Triglycerides: 70 mg/dL (ref 0.0–149.0)
VLDL: 14 mg/dL (ref 0.0–40.0)

## 2021-02-24 LAB — HEPATIC FUNCTION PANEL
ALT: 20 U/L (ref 0–35)
AST: 17 U/L (ref 0–37)
Albumin: 4.4 g/dL (ref 3.5–5.2)
Alkaline Phosphatase: 72 U/L (ref 39–117)
Bilirubin, Direct: 0.2 mg/dL (ref 0.0–0.3)
Total Bilirubin: 1.5 mg/dL — ABNORMAL HIGH (ref 0.2–1.2)
Total Protein: 7 g/dL (ref 6.0–8.3)

## 2021-02-24 LAB — VITAMIN D 25 HYDROXY (VIT D DEFICIENCY, FRACTURES): VITD: 65.54 ng/mL (ref 30.00–100.00)

## 2021-03-26 ENCOUNTER — Other Ambulatory Visit: Payer: Self-pay

## 2021-03-26 ENCOUNTER — Ambulatory Visit: Payer: Medicare HMO | Admitting: Internal Medicine

## 2021-03-26 ENCOUNTER — Encounter: Payer: Self-pay | Admitting: Internal Medicine

## 2021-03-26 VITALS — BP 136/78 | HR 75 | Ht 62.0 in | Wt 136.6 lb

## 2021-03-26 DIAGNOSIS — I480 Paroxysmal atrial fibrillation: Secondary | ICD-10-CM

## 2021-03-26 NOTE — Patient Instructions (Signed)

## 2021-03-26 NOTE — Progress Notes (Signed)
HPI Chelsea Morgan (pronounced Eather Colas) returns today for followup. She is a pleasant 71 yo woman with a h/o atrial flutter who underwent catheter ablation years ago and then developed atrial fib for which she was asymptomatic. When I saw her I recommended Xarelto (CHADSVASC 3), but she has declined. She does not have palpitations and her atrial fib is controlled. Allergies  Allergen Reactions   Sulfa Antibiotics Swelling    Hospitalized x 3 days 20001     Current Outpatient Medications  Medication Sig Dispense Refill   atorvastatin (LIPITOR) 20 MG tablet Take 1 tablet by mouth once daily 90 tablet 0   b complex vitamins capsule Take 1 capsule by mouth daily.     BIOTIN 5000 PO Take 1 tablet by mouth daily.      carvedilol (COREG) 12.5 MG tablet Take 1/2 (one-half) tablet by mouth twice daily 90 tablet 0   EPINEPHrine (EPI-PEN) 0.3 mg/0.3 mL DEVI Inject 0.3 mg into the muscle once.     finasteride (PROSCAR) 5 MG tablet Take 1/2 (one-half) tablet by mouth once daily 45 tablet 0   hydrochlorothiazide (HYDRODIURIL) 25 MG tablet Take 1 tablet by mouth once daily 90 tablet 0   Magnesium 250 MG TABS Take 1 tablet by mouth 2 (two) times daily.     Omega-3 Fatty Acids (FISH OIL) 1360 MG CAPS Take by mouth daily.     POTASSIUM PO Take 1 capsule by mouth daily. OTC     No current facility-administered medications for this visit.     Past Medical History:  Diagnosis Date   ALLERGIC RHINITIS 01/12/2008   Atrial flutter (HCC)    Blood transfusion without reported diagnosis 1981   after ectopic pregnancy   CAROTID BRUITS, BILATERAL 04/01/2009   Essential hypertension, benign 01/12/2008   HYPERLIPIDEMIA 05/02/2007   Rheumatic fever     ROS:   All systems reviewed and negative except as noted in the HPI.   Past Surgical History:  Procedure Laterality Date   ABLATION  12/28/2013   atrial flutter ablation by Dr Ladona Ridgel   ATRIAL FLUTTER ABLATION N/A 12/28/2013   Procedure: ATRIAL FLUTTER  ABLATION;  Surgeon: Marinus Maw, MD;  Location: [facility] CATH LAB;  Service: Cardiovascular;  Laterality: N/A;   CARPAL TUNNEL RELEASE Right 03/08/2005   CATARACT EXTRACTION Bilateral    SALPINGECTOMY  03/09/1979     Family History  Problem Relation Age of Onset   Cancer Mother        stomach   Esophageal cancer Mother 66       per pt esophageal ca at [facility] [place]   Stroke Father    Cancer Paternal Aunt        colon   Colon cancer Paternal Aunt 7   Rectal cancer Neg Hx    Stomach cancer Neg Hx      Social History   Socioeconomic History   Marital status: Married    Spouse name: Not on file   Number of children: Not on file   Years of education: Not on file   Highest education level: Not on file  Occupational History   Not on file  Tobacco Use   Smoking status: Never   Smokeless tobacco: Never  Substance and Sexual Activity   Alcohol use: Yes    Alcohol/week: 0.0 standard drinks    Comment: occasionally   Drug use: Yes    Comment: rare   Sexual activity: Not on file  Other Topics Concern  Not on file  Social History Narrative   Not on file   Social Determinants of Health   Financial Resource Strain: Low Risk    Difficulty of Paying Living Expenses: Not hard at all  Food Insecurity: No Food Insecurity   Worried About Programme researcher, broadcasting/film/video in the Last Year: Never true   Barista in the Last Year: Never true  Transportation Needs: No Transportation Needs   Lack of Transportation (Medical): No   Lack of Transportation (Non-Medical): No  Physical Activity: Sufficiently Active   Days of Exercise per Week: 7 days   Minutes of Exercise per Session: 30 min  Stress: No Stress Concern Present   Feeling of Stress : Not at all  Social Connections: Moderately Integrated   Frequency of Communication with Friends and Family: More than three times a week   Frequency of Social Gatherings with Friends and Family: More than three times a week   Attends Religious  Services: More than 4 times per year   Active Member of [employer] or Organizations: No   Attends Engineer, structural: Never   Marital Status: Married  Catering manager Violence: Not At Risk   Fear of Current or Ex-Partner: No   Emotionally Abused: No   Physically Abused: No   Sexually Abused: No     BP 136/78    Pulse 75    Ht 5\' 2"  (1.575 m)    Wt 136 lb 9.6 oz (62 kg)    SpO2 98%    BMI 24.98 kg/m   Physical Exam:  Well appearing NAD HEENT: Unremarkable Neck:  No JVD, no thyromegally Lymphatics:  No adenopathy Back:  No CVA tenderness Lungs:  Clear with no wheezes HEART:  Regular rate rhythm, no murmurs, no rubs, no clicks Abd:  soft, positive bowel sounds, no organomegally, no rebound, no guarding Ext:  2 plus pulses, no edema, no cyanosis, no clubbing Skin:  No rashes no nodules Neuro:  CN II through XII intact, motor grossly intact  EKG - atrial fib with a CVR  DEVICE  Normal device function.  See PaceArt for details.   Assess/Plan:  1. Atrial fib - her rates are controlled. No change in her meds.  2. Coags - she decided not to take Xarelto. I explained the risk/benefit but she prefers to avoid systemic anti-coagulation. 3. 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 She has had a 2D echo which was reassuring. She states that the amyloid has gone. 4. Atrial flutter - she is s/p ablation and has had no recurrent flutter.   Haniya Fern,MD

## 2021-05-19 ENCOUNTER — Other Ambulatory Visit: Payer: Self-pay | Admitting: Family Medicine

## 2021-05-19 DIAGNOSIS — I4892 Unspecified atrial flutter: Secondary | ICD-10-CM

## 2021-05-25 ENCOUNTER — Other Ambulatory Visit: Payer: Self-pay | Admitting: Family Medicine

## 2021-05-25 DIAGNOSIS — I10 Essential (primary) hypertension: Secondary | ICD-10-CM

## 2021-05-25 DIAGNOSIS — I4892 Unspecified atrial flutter: Secondary | ICD-10-CM

## 2021-05-25 DIAGNOSIS — L659 Nonscarring hair loss, unspecified: Secondary | ICD-10-CM

## 2021-06-08 DIAGNOSIS — R490 Dysphonia: Secondary | ICD-10-CM | POA: Diagnosis not present

## 2021-06-08 DIAGNOSIS — L539 Erythematous condition, unspecified: Secondary | ICD-10-CM | POA: Diagnosis not present

## 2021-06-08 DIAGNOSIS — Z9089 Acquired absence of other organs: Secondary | ICD-10-CM | POA: Diagnosis not present

## 2021-06-08 DIAGNOSIS — J384 Edema of larynx: Secondary | ICD-10-CM | POA: Diagnosis not present

## 2021-06-08 DIAGNOSIS — J387 Other diseases of larynx: Secondary | ICD-10-CM | POA: Diagnosis not present

## 2021-06-08 DIAGNOSIS — J383 Other diseases of vocal cords: Secondary | ICD-10-CM | POA: Diagnosis not present

## 2021-07-20 ENCOUNTER — Ambulatory Visit: Payer: Medicare HMO

## 2021-07-23 ENCOUNTER — Ambulatory Visit (INDEPENDENT_AMBULATORY_CARE_PROVIDER_SITE_OTHER): Payer: Medicare HMO

## 2021-07-23 DIAGNOSIS — Z0000 Encounter for general adult medical examination without abnormal findings: Secondary | ICD-10-CM | POA: Diagnosis not present

## 2021-07-23 NOTE — Progress Notes (Addendum)
Virtual Visit via Telephone Note  I connected with  Chelsea Morgan on 07/23/21 at 10:00 AM EDT by telephone and verified that I am speaking with the correct person using two identifiers.  Medicare Annual Wellness visit completed telephonically due to Covid-19 pandemic.   Persons participating in this call: This Health Coach and this patient.   Location: Patient: Home Provider: Office   I discussed the limitations, risks, security and privacy concerns of performing an evaluation and management service by telephone and the availability of in person appointments. The patient expressed understanding and agreed to proceed.  Unable to perform video visit due to video visit attempted and failed and/or patient does not have video capability.   Some vital signs may be absent or patient reported.   Marzella Schlein, LPN   Subjective:   Chelsea Morgan is a 71 y.o. female who presents for Medicare Annual (Subsequent) preventive examination.  Review of Systems     Cardiac Risk Factors include: advanced age (>28men, >98 women);dyslipidemia;hypertension     Objective:    There were no vitals filed for this visit. There is no height or weight on file to calculate BMI.     07/23/2021    9:54 AM 07/14/2020   12:50 PM 11/14/2014    7:42 AM 12/28/2013    2:00 PM  Advanced Directives  Does Patient Have a Medical Advance Directive? Yes Yes Yes No  Type of Advance Directive Living will Healthcare Power of [place];Living will Living will;Healthcare Power of Attorney   Copy of Healthcare Power of Attorney in Chart?  No - copy requested      Current Medications (verified) Outpatient Encounter Medications as of 07/23/2021  Medication Sig   atorvastatin (LIPITOR) 20 MG tablet Take 1 tablet by mouth once daily   b complex vitamins capsule Take 1 capsule by mouth daily.   BIOTIN 5000 PO Take 1 tablet by mouth daily.    carvedilol (COREG) 12.5 MG tablet Take 1/2 (one-half) tablet by mouth twice daily    EPINEPHrine (EPI-PEN) 0.3 mg/0.3 mL DEVI Inject 0.3 mg into the muscle once.   finasteride (PROSCAR) 5 MG tablet Take 1/2 (one-half) tablet by mouth once daily   hydrochlorothiazide (HYDRODIURIL) 25 MG tablet Take 1 tablet by mouth once daily   Magnesium 250 MG TABS Take 1 tablet by mouth 2 (two) times daily.   Omega-3 Fatty Acids (FISH OIL) 1360 MG CAPS Take by mouth daily.   POTASSIUM PO Take 1 capsule by mouth daily. OTC   No facility-administered encounter medications on file as of 07/23/2021.    Allergies (verified) Sulfa antibiotics   History: Past Medical History:  Diagnosis Date   ALLERGIC RHINITIS 01/12/2008   Atrial flutter (HCC)    Blood transfusion without reported diagnosis 1981   after ectopic pregnancy   CAROTID BRUITS, BILATERAL 04/01/2009   Essential hypertension, benign 01/12/2008   HYPERLIPIDEMIA 05/02/2007   Rheumatic fever    Past Surgical History:  Procedure Laterality Date   ABLATION  12/28/2013   atrial flutter ablation by Dr Ladona Ridgel   ATRIAL FLUTTER ABLATION N/A 12/28/2013   Procedure: ATRIAL FLUTTER ABLATION;  Surgeon: Marinus Maw, MD;  Location: [facility] CATH LAB;  Service: Cardiovascular;  Laterality: N/A;   CARPAL TUNNEL RELEASE Right 03/08/2005   CATARACT EXTRACTION Bilateral    SALPINGECTOMY  03/09/1979   Family History  Problem Relation Age of Onset   Cancer Mother        stomach   Esophageal cancer Mother  70       [facility] [place]   Stroke Father    Cancer Paternal Aunt        colon   Colon cancer Paternal Aunt [id number]   Rectal cancer Neg Hx    Stomach cancer Neg Hx    Social History   Socioeconomic History   Marital status: Married    Spouse name: Not on file   Number of children: Not on file   Years of education: Not on file   Highest education level: Not on file  Occupational History   Not on file  Tobacco Use   Smoking status: Never   Smokeless tobacco: Never  Substance and Sexual Activity   Alcohol use: Yes     Alcohol/week: 0.0 standard drinks    Comment: occasionally   Drug use: Not Currently    Comment: rare   Sexual activity: Not on file  Other Topics Concern   Not on file  Social History Narrative   Not on file   Social Determinants of Health   Financial Resource Strain: Low Risk    Difficulty of Paying Living Expenses: Not hard at all  Food Insecurity: No Food Insecurity   Worried About Programme researcher, broadcasting/film/videounning Out of Food in the Last Year: Never true   Ran Out of Food in the Last Year: Never true  Transportation Needs: No Transportation Needs   Lack of Transportation (Medical): No   Lack of Transportation (Non-Medical): No  Physical Activity: Sufficiently Active   Days of Exercise per Week: 5 days   Minutes of Exercise per Session: 30 min  Stress: No Stress Concern Present   Feeling of Stress : Not at all  Social Connections: Moderately Integrated   Frequency of Communication with Friends and Family: More than three times a week   Frequency of Social Gatherings with Friends and Family: More than three times a week   Attends Religious Services: More than 4 times per year   Active Member of [employer] or Organizations: No   Attends Engineer, structuralClub or Organization Meetings: Never   Marital Status: Married    Tobacco Counseling Counseling given: Not Answered   Clinical Intake:  Pre-visit preparation completed: Yes        BMI - recorded: 24.98 Nutritional Status: BMI of 19-24  Normal Nutritional Risks: None Diabetes: No  How often do you need to have someone help you when you read instructions, pamphlets, or other written materials from your doctor or pharmacy?: 1 - Never  Diabetic?no  Interpreter Needed?: No  Information entered by :: Lanier Ensignina Abi Shoults, LPN   Activities of Daily Living    07/23/2021    9:55 AM 02/23/2021    9:00 AM  In your present state of health, do you have any difficulty performing the following activities:  Hearing? 0 0  Vision? 0 0  Difficulty concentrating or  making decisions? 0 0  Walking or climbing stairs? 0 0  Dressing or bathing? 0 0  Doing errands, shopping? 0 0  Preparing Food and eating ? N   Using the Toilet? N   In the past six months, have you accidently leaked urine? N   Do you have problems with loss of bowel control? N   Managing your Medications? N   Managing your Finances? N   Housekeeping or managing your Housekeeping? N     Patient Care Team: Sheliah Hatchabori, Katherine E, MD as PCP - General (Family Medicine) Coralie CarpenGreenberg, Brent, MD as Consulting Physician (Family  Medicine) Bradly Bienenstock, MD as Consulting Physician (Orthopedic Surgery) Marlow Baars, MD as Consulting Physician (Obstetrics)  Indicate any recent Medical Services you may have received from other than [facility] providers in the past year (date may be approximate).     Assessment:   This is a routine wellness examination for Chelsea Morgan.  Hearing/Vision screen Hearing Screening - Comments:: Pt denies any hearing issues  Vision Screening - Comments:: Pt follows up with Dr Cherlynn Polo for annul eye exams   Dietary issues and exercise activities discussed: Current Exercise Habits: Home exercise routine, Type of exercise: Other - see comments;stretching;walking, Time (Minutes): 30, Frequency (Times/Week): 5, Weekly Exercise (Minutes/Week): 150   Goals Addressed             This Visit's Progress    Patient Stated       A little more walking        Depression Screen    07/23/2021    9:53 AM 02/23/2021    9:00 AM 08/18/2020   10:26 AM 07/14/2020   12:52 PM 02/18/2020   11:02 AM 07/30/2019   10:27 AM 01/29/2019    1:04 PM  PHQ 2/9 Scores  PHQ - 2 Score 0 0 0 0 0 0 0  PHQ- 9 Score  1 0  0 0 0    Fall Risk    07/23/2021    9:55 AM 02/23/2021    9:00 AM 08/18/2020   10:25 AM 07/14/2020   12:52 PM 02/18/2020   11:02 AM  Fall Risk   Falls in the past year? 0 0 0 0 0  Number falls in past yr: 0  0 0 0  Injury with Fall? 0  0 0 0  Risk for fall due to : Impaired vision No  Fall Risks No Fall Risks  No Fall Risks  Follow up Falls prevention discussed Falls evaluation completed  Falls prevention discussed     FALL RISK PREVENTION PERTAINING TO THE HOME:  Any stairs in or around the home? Yes  If so, are there any without handrails? No  Home free of loose throw rugs in walkways, pet beds, electrical cords, etc? Yes  Adequate lighting in your home to reduce risk of falls? Yes   ASSISTIVE DEVICES UTILIZED TO PREVENT FALLS:  Life alert? No  Use of a cane, walker or w/c? No  Grab bars in the bathroom? No  Shower chair or bench in shower? No  Elevated toilet seat or a handicapped toilet? No   TIMED UP AND GO:  Was the test performed? No .   Cognitive Function:        07/23/2021    9:55 AM 07/14/2020   12:55 PM  6CIT Screen  What Year? 0 points 0 points  What month? 0 points 0 points  What time? 0 points 0 points  Count back from 20 0 points 0 points  Months in reverse 0 points 0 points  Repeat phrase 0 points 0 points  Total Score 0 points 0 points    Immunizations Immunization History  Administered Date(s) Administered   Influenza Split 02/05/2013, 12/06/2013, 02/05/2014, 12/07/2014, 01/14/2017, 12/21/2019   Influenza, High Dose Seasonal PF 12/21/2019, 12/10/2020   Influenza, Quadrivalent, Recombinant, Inj, Pf 12/21/2017, 12/20/2018   Influenza,inj,Quad PF,6+ Mos 01/14/2017   Influenza-Unspecified 12/06/2013, 02/05/2014, 12/07/2014   PFIZER Comirnaty(Gray Top)Covid-19 Tri-Sucrose Vaccine 04/12/2019, 05/08/2019   PFIZER(Purple Top)SARS-COV-2 Vaccination 04/12/2019, 05/08/2019   Pneumococcal Conjugate-13 09/03/2014   Pneumococcal Polysaccharide-23 09/15/2015   Tdap 08/07/2013   Zoster  Recombinat (Shingrix) 10/30/2018, 01/10/2019   Zoster, Live 09/04/2013    TDAP status: Up to date  Flu Vaccine status: Up to date  Pneumococcal vaccine status: Up to date  Covid-19 vaccine status: Completed vaccines  Qualifies for Shingles Vaccine? Yes    Zostavax completed Yes   Shingrix Completed?: No.    Education has been provided regarding the importance of this vaccine. Patient has been advised to call insurance company to determine out of pocket expense if they have not yet received this vaccine. Advised may also receive vaccine at local pharmacy or Health Dept. Verbalized acceptance and understanding.  Screening Tests Health Maintenance  Topic Date Due   COVID-19 Vaccine (5 - Booster) 07/03/2019   INFLUENZA VACCINE  10/06/2021   MAMMOGRAM  11/11/2021   TETANUS/TDAP  08/08/2023   COLONOSCOPY (Pts 45-55yrs Insurance coverage will need to be confirmed)  11/27/2024   Pneumonia Vaccine 69+ Years old  Completed   DEXA SCAN  Completed   Hepatitis C Screening  Completed   Zoster Vaccines- Shingrix  Completed   HPV VACCINES  Aged Out    Health Maintenance  Health Maintenance Due  Topic Date Due   COVID-19 Vaccine (5 - Booster) 07/03/2019    Colorectal cancer screening: Type of screening: Colonoscopy. Completed 11/28/14. Repeat every 10 years  Mammogram status: Completed 11/11/20. Repeat every year  Bone Density status: Completed 04/07/20. Results reflect: Bone density results: OSTEOPENIA. Repeat every 2 years.   Additional Screening:  Hepatitis C Screening:  Completed 08/18/20  Vision Screening: Recommended annual ophthalmology exams for early detection of glaucoma and other disorders of the eye. Is the patient up to date with their annual eye exam?  Yes  Who is the provider or what is the name of the office in which the patient attends annual eye exams? Dr Cherlynn Polo  If pt is not established with a provider, would they like to be referred to a provider to establish care? No .   Dental Screening: Recommended annual dental exams for proper oral hygiene  Community Resource Referral / Chronic Care Management: CRR required this visit?  No   CCM required this visit?  No      Plan:     I have personally reviewed and noted the  following in the patient's chart:   Medical and social history Use of alcohol, tobacco or illicit drugs  Current medications and supplements including opioid prescriptions.  Functional ability and status Nutritional status Physical activity Advanced directives List of other physicians Hospitalizations, surgeries, and ER visits in previous 12 months Vitals Screenings to include cognitive, depression, and falls Referrals and appointments  In addition, I have reviewed and discussed with patient certain preventive protocols, quality metrics, and best practice recommendations. A written personalized care plan for preventive services as well as general preventive health recommendations were provided to patient.     Marzella Schlein, LPN   0/27/2536   Nurse Notes: None

## 2021-07-23 NOTE — Patient Instructions (Signed)
Chelsea Morgan , Thank you for taking time to come for your Medicare Wellness Visit. I appreciate your ongoing commitment to your health goals. Please review the following plan we discussed and let me know if I can assist you in the future.   Screening recommendations/referrals: Colonoscopy: Done 11/28/14 repeat every 10 years  Mammogram: Done 11/11/20 repeat every year  Bone Density: Done 04/07/20 repeat every 2 years  Recommended yearly ophthalmology/optometry visit for glaucoma screening and checkup Recommended yearly dental visit for hygiene and checkup  Vaccinations: Influenza vaccine: Done 12/10/20 repeat every year  Pneumococcal vaccine: Up to date Tdap vaccine: Done 08/07/13 repeat veery 10 years  Shingles vaccine: Completed   8/24/820, 01/10/19 Covid-19:Completed 2/4, 05/08/19  Advanced directives: Please bring a copy of your health care power of attorney and living will to the office at your convenience.  Conditions/risks identified: walk more and continue exercise   Next appointment: Follow up in one year for your annual wellness visit    Preventive Care 65 Years and Older, Female Preventive care refers to lifestyle choices and visits with your health care provider that can promote health and wellness. What does preventive care include? A yearly physical exam. This is also called an annual well check. Dental exams once or twice a year. Routine eye exams. Ask your health care provider how often you should have your eyes checked. Personal lifestyle choices, including: Daily care of your teeth and gums. Regular physical activity. Eating a healthy diet. Avoiding tobacco and drug use. Limiting alcohol use. Practicing safe sex. Taking low-dose aspirin every day. Taking vitamin and mineral supplements as recommended by your health care provider. What happens during an annual well check? The services and screenings done by your health care provider during your annual well check will depend  on your age, overall health, lifestyle risk factors, and family history of disease. Counseling  Your health care provider may ask you questions about your: Alcohol use. Tobacco use. Drug use. Emotional well-being. Home and relationship well-being. Sexual activity. Eating habits. History of falls. Memory and ability to understand (cognition). Work and work Astronomer. Reproductive health. Screening  You may have the following tests or measurements: Height, weight, and BMI. Blood pressure. Lipid and cholesterol levels. These may be checked every 5 years, or more frequently if you are over 96 years old. Skin check. Lung cancer screening. You may have this screening every year starting at age 21 if you have a 30-pack-year history of smoking and currently smoke or have quit within the past 15 years. Fecal occult blood test (FOBT) of the stool. You may have this test every year starting at age 73. Flexible sigmoidoscopy or colonoscopy. You may have a sigmoidoscopy every 5 years or a colonoscopy every 10 years starting at age 73. Hepatitis C blood test. Hepatitis B blood test. Sexually transmitted disease (STD) testing. Diabetes screening. This is done by checking your blood sugar (glucose) after you have not eaten for a while (fasting). You may have this done every 1-3 years. Bone density scan. This is done to screen for osteoporosis. You may have this done starting at age 73. Mammogram. This may be done every 1-2 years. Talk to your health care provider about how often you should have regular mammograms. Talk with your health care provider about your test results, treatment options, and if necessary, the need for more tests. Vaccines  Your health care provider may recommend certain vaccines, such as: Influenza vaccine. This is recommended every year. Tetanus, diphtheria, and  acellular pertussis (Tdap, Td) vaccine. You may need a Td booster every 10 years. Zoster vaccine. You may need  this after age 11. Pneumococcal 13-valent conjugate (PCV13) vaccine. One dose is recommended after age 73. Pneumococcal polysaccharide (PPSV23) vaccine. One dose is recommended after age 73. Talk to your health care provider about which screenings and vaccines you need and how often you need them. This information is not intended to replace advice given to you by your health care provider. Make sure you discuss any questions you have with your health care provider. Document Released: 03/21/2015 Document Revised: 11/12/2015 Document Reviewed: 12/24/2014 Elsevier Interactive Patient Education  2017 [employer] Prevention in the Home Falls can cause injuries. They can happen to people of all ages. There are many things you can do to make your home safe and to help prevent falls. What can I do on the outside of my home? Regularly fix the edges of walkways and driveways and fix any cracks. Remove anything that might make you trip as you walk through a door, such as a raised step or threshold. Trim any bushes or trees on the path to your home. Use bright outdoor lighting. Clear any walking paths of anything that might make someone trip, such as rocks or tools. Regularly check to see if handrails are loose or broken. Make sure that both sides of any steps have handrails. Any raised decks and porches should have guardrails on the edges. Have any leaves, snow, or ice cleared regularly. Use sand or salt on walking paths during winter. Clean up any spills in your garage right away. This includes oil or grease spills. What can I do in the bathroom? Use night lights. Install grab bars by the toilet and in the tub and shower. Do not use towel bars as grab bars. Use non-skid mats or decals in the tub or shower. If you need to sit down in the shower, use a plastic, non-slip stool. Keep the floor dry. Clean up any water that spills on the floor as soon as it happens. Remove soap buildup in the tub  or shower regularly. Attach bath mats securely with double-sided non-slip rug tape. Do not have throw rugs and other things on the floor that can make you trip. What can I do in the bedroom? Use night lights. Make sure that you have a light by your bed that is easy to reach. Do not use any sheets or blankets that are too big for your bed. They should not hang down onto the floor. Have a firm chair that has side arms. You can use this for support while you get dressed. Do not have throw rugs and other things on the floor that can make you trip. What can I do in the kitchen? Clean up any spills right away. Avoid walking on wet floors. Keep items that you use a lot in easy-to-reach places. If you need to reach something above you, use a strong step stool that has a grab bar. Keep electrical cords out of the way. Do not use floor polish or wax that makes floors slippery. If you must use wax, use non-skid floor wax. Do not have throw rugs and other things on the floor that can make you trip. What can I do with my stairs? Do not leave any items on the stairs. Make sure that there are handrails on both sides of the stairs and use them. Fix handrails that are broken or loose. Make sure that  handrails are as long as the stairways. Check any carpeting to make sure that it is firmly attached to the stairs. Fix any carpet that is loose or worn. Avoid having throw rugs at the top or bottom of the stairs. If you do have throw rugs, attach them to the floor with carpet tape. Make sure that you have a light switch at the top of the stairs and the bottom of the stairs. If you do not have them, ask someone to add them for you. What else can I do to help prevent falls? Wear shoes that: Do not have high heels. Have rubber bottoms. Are comfortable and fit you well. Are closed at the toe. Do not wear sandals. If you use a stepladder: Make sure that it is fully opened. Do not climb a closed stepladder. Make  sure that both sides of the stepladder are locked into place. Ask someone to hold it for you, if possible. Clearly mark and make sure that you can see: Any grab bars or handrails. First and last steps. Where the edge of each step is. Use tools that help you move around (mobility aids) if they are needed. These include: Canes. Walkers. Scooters. Crutches. Turn on the lights when you go into a dark area. Replace any light bulbs as soon as they burn out. Set up your furniture so you have a clear path. Avoid moving your furniture around. If any of your floors are uneven, fix them. If there are any pets around you, be aware of where they are. Review your medicines with your doctor. Some medicines can make you feel dizzy. This can increase your chance of falling. Ask your doctor what other things that you can do to help prevent falls. This information is not intended to replace advice given to you by your health care provider. Make sure you discuss any questions you have with your health care provider. Document Released: 12/19/2008 Document Revised: 07/31/2015 Document Reviewed: 03/29/2014 Elsevier Interactive Patient Education  2017 [employer].

## 2021-08-21 ENCOUNTER — Other Ambulatory Visit: Payer: Self-pay | Admitting: Family Medicine

## 2021-08-21 DIAGNOSIS — L659 Nonscarring hair loss, unspecified: Secondary | ICD-10-CM

## 2021-08-21 DIAGNOSIS — I10 Essential (primary) hypertension: Secondary | ICD-10-CM

## 2021-08-27 ENCOUNTER — Encounter: Payer: Self-pay | Admitting: Family Medicine

## 2021-08-27 ENCOUNTER — Ambulatory Visit (INDEPENDENT_AMBULATORY_CARE_PROVIDER_SITE_OTHER): Payer: Medicare HMO | Admitting: Family Medicine

## 2021-08-27 VITALS — BP 100/68 | HR 74 | Temp 98.8°F | Resp 16 | Ht 62.0 in | Wt 135.1 lb

## 2021-08-27 DIAGNOSIS — I10 Essential (primary) hypertension: Secondary | ICD-10-CM | POA: Diagnosis not present

## 2021-08-27 DIAGNOSIS — L989 Disorder of the skin and subcutaneous tissue, unspecified: Secondary | ICD-10-CM | POA: Diagnosis not present

## 2021-08-27 DIAGNOSIS — M545 Low back pain, unspecified: Secondary | ICD-10-CM | POA: Diagnosis not present

## 2021-08-27 DIAGNOSIS — E785 Hyperlipidemia, unspecified: Secondary | ICD-10-CM | POA: Diagnosis not present

## 2021-08-27 LAB — TSH: TSH: 2.63 u[IU]/mL (ref 0.35–5.50)

## 2021-08-27 LAB — CBC WITH DIFFERENTIAL/PLATELET
Basophils Absolute: 0 10*3/uL (ref 0.0–0.1)
Basophils Relative: 0.9 % (ref 0.0–3.0)
Eosinophils Absolute: 0.2 10*3/uL (ref 0.0–0.7)
Eosinophils Relative: 3.8 % (ref 0.0–5.0)
HCT: 44.1 % (ref 36.0–46.0)
Hemoglobin: 14.4 g/dL (ref 12.0–15.0)
Lymphocytes Relative: 25.5 % (ref 12.0–46.0)
Lymphs Abs: 1 10*3/uL (ref 0.7–4.0)
MCHC: 32.8 g/dL (ref 30.0–36.0)
MCV: 90.7 fl (ref 78.0–100.0)
Monocytes Absolute: 0.4 10*3/uL (ref 0.1–1.0)
Monocytes Relative: 9.5 % (ref 3.0–12.0)
Neutro Abs: 2.4 10*3/uL (ref 1.4–7.7)
Neutrophils Relative %: 60.3 % (ref 43.0–77.0)
Platelets: 236 10*3/uL (ref 150.0–400.0)
RBC: 4.86 Mil/uL (ref 3.87–5.11)
RDW: 13.8 % (ref 11.5–15.5)
WBC: 4 10*3/uL (ref 4.0–10.5)

## 2021-08-27 LAB — HEPATIC FUNCTION PANEL
ALT: 20 U/L (ref 0–35)
AST: 19 U/L (ref 0–37)
Albumin: 4.2 g/dL (ref 3.5–5.2)
Alkaline Phosphatase: 71 U/L (ref 39–117)
Bilirubin, Direct: 0.3 mg/dL (ref 0.0–0.3)
Total Bilirubin: 1.4 mg/dL — ABNORMAL HIGH (ref 0.2–1.2)
Total Protein: 7 g/dL (ref 6.0–8.3)

## 2021-08-27 LAB — LIPID PANEL
Cholesterol: 159 mg/dL (ref 0–200)
HDL: 58.9 mg/dL (ref >39.00)
LDL Cholesterol: 85 mg/dL (ref 0–99)
NonHDL: 100.15
Total CHOL/HDL Ratio: 3
Triglycerides: 76 mg/dL (ref 0.0–149.0)
VLDL: 15.2 mg/dL (ref 0.0–40.0)

## 2021-08-27 LAB — BASIC METABOLIC PANEL
BUN: 9 mg/dL (ref 6–23)
CO2: 32 mEq/L (ref 19–32)
Calcium: 9.8 mg/dL (ref 8.4–10.5)
Chloride: 99 mEq/L (ref 96–112)
Creatinine, Ser: 0.71 mg/dL (ref 0.40–1.20)
GFR: 84.62 mL/min (ref >60.00)
Glucose, Bld: 94 mg/dL (ref 70–99)
Potassium: 4 mEq/L (ref 3.5–5.1)
Sodium: 135 mEq/L (ref 135–145)

## 2021-08-27 NOTE — Patient Instructions (Addendum)
Schedule your complete physical in 6 months We'll notify you of your lab results and make any changes if needed We'll call you to schedule your Derm appt Keep up the good work!  You look great! Call with any questions or concerns Stay Safe!  Stay Healthy! Have a great summer!!!

## 2021-08-27 NOTE — Progress Notes (Incomplete)
   Subjective:    Patient ID: Chelsea Morgan, female    DOB: December 28, 1950, 71 y.o.   MRN: [id number]  HPI HTN- chronic problem, on HCTZ 25mg  daily and Carvedilol 6.25mg  BID w/ excellent control.  Pt reports feeling good.  No CP, SOB, HAs, visual changes, edema.  Hyperlipidemia- chronic problem, on Lipitor 20mg  daily.  No abd pain, N/V.  Lumbar back pain- bilateral.  Worse upon waking.  Sxs started 'some time' ago.  Sxs seem to be improving as she does the back exercises assigned to her husband.  Facial lesion- under L eye.  Present for 'years' but 'worsening'   Review of Systems For ROS see HPI     Objective:   Physical Exam Vitals reviewed.  Constitutional:      General: She is not in acute distress.    Appearance: Normal appearance. She is well-developed. She is not ill-appearing.  HENT:     Head: Normocephalic and atraumatic.  Eyes:     Conjunctiva/sclera: Conjunctivae normal.     Pupils: Pupils are equal, round, and reactive to light.  Neck:     Thyroid: No thyromegaly.  Cardiovascular:     Rate and Rhythm: Normal rate and regular rhythm.     Pulses: Normal pulses.     Heart sounds: Normal heart sounds. No murmur heard. Pulmonary:     Effort: Pulmonary effort is normal. No respiratory distress.     Breath sounds: Normal breath sounds.  Abdominal:     General: There is no distension.     Palpations: Abdomen is soft.     Tenderness: There is no abdominal tenderness.  Musculoskeletal:     Cervical back: Normal range of motion and neck supple.     Right lower leg: No edema.     Left lower leg: No edema.  Lymphadenopathy:     Cervical: No cervical adenopathy.  Skin:    General: Skin is warm and dry.     Findings: Lesion (small abnormality under L eye) present.  Neurological:     General: No focal deficit present.     Mental Status: She is alert and oriented to person, place, and time.  Psychiatric:        Mood and Affect: Mood normal.        Behavior: Behavior normal.            Assessment & Plan:

## 2021-08-28 ENCOUNTER — Telehealth: Payer: Self-pay

## 2021-08-28 NOTE — Assessment & Plan Note (Signed)
Chronic problem.  Currently on HCTZ 25mg  daily and Coreg 6.25mg  BID w/ excellent control.  We discussed decreasing or stopping the HCTZ but pt reports last time this was attempted she developed swelling of feet and ankles.  Prefers to keep dose as is.  Check labs due to diuretic but no anticipated med changes.

## 2021-08-28 NOTE — Progress Notes (Signed)
   Subjective:    Patient ID: Chelsea Morgan, female    DOB: 09-24-1950, 71 y.o.   MRN: [id number]  HPI HTN- chronic problem, on HCTZ 25mg  daily and Carvedilol 6.25mg  BID w/ excellent control.  Pt reports feeling good.  No CP, SOB, HAs, visual changes, edema.  Hyperlipidemia- chronic problem, on Lipitor 20mg  daily.  No abd pain, N/V.  Lumbar back pain- bilateral.  Worse upon waking.  Sxs started 'some time' ago.  Sxs seem to be improving as she does the back exercises assigned to her husband.  Facial lesion- under L eye.  Present for 'years' but 'worsening'   Review of Systems For ROS see HPI     Objective:   Physical Exam Vitals reviewed.  Constitutional:      General: She is not in acute distress.    Appearance: Normal appearance. She is well-developed. She is not ill-appearing.  HENT:     Head: Normocephalic and atraumatic.  Eyes:     Conjunctiva/sclera: Conjunctivae normal.     Pupils: Pupils are equal, round, and reactive to light.  Neck:     Thyroid: No thyromegaly.  Cardiovascular:     Rate and Rhythm: Normal rate and regular rhythm.     Pulses: Normal pulses.     Heart sounds: Normal heart sounds. No murmur heard. Pulmonary:     Effort: Pulmonary effort is normal. No respiratory distress.     Breath sounds: Normal breath sounds.  Abdominal:     General: There is no distension.     Palpations: Abdomen is soft.     Tenderness: There is no abdominal tenderness.  Musculoskeletal:     Cervical back: Normal range of motion and neck supple.     Right lower leg: No edema.     Left lower leg: No edema.  Lymphadenopathy:     Cervical: No cervical adenopathy.  Skin:    General: Skin is warm and dry.     Findings: Lesion (small abnormality under L eye) present.  Neurological:     General: No focal deficit present.     Mental Status: She is alert and oriented to person, place, and time.  Psychiatric:        Mood and Affect: Mood normal.        Behavior: Behavior normal.            Assessment & Plan:  Lumbar back pain- new.  Bilateral across lower back.  Sxs have been ongoing.  Worse upon waking.  Pt reports she is doing the back exercises prescribed to her husband after his recent surgery and these seem to be helping.  She wanted me to be aware in case referral was needed.  Face lesion- new.  Area is consistent w/ sun damage.  Will refer to Derm for definitive tx.  Pt expressed understanding and is in agreement w/ plan.

## 2021-10-12 DIAGNOSIS — H524 Presbyopia: Secondary | ICD-10-CM | POA: Diagnosis not present

## 2021-10-12 DIAGNOSIS — H02834 Dermatochalasis of left upper eyelid: Secondary | ICD-10-CM | POA: Diagnosis not present

## 2021-10-12 DIAGNOSIS — H02831 Dermatochalasis of right upper eyelid: Secondary | ICD-10-CM | POA: Diagnosis not present

## 2021-11-13 DIAGNOSIS — Z01419 Encounter for gynecological examination (general) (routine) without abnormal findings: Secondary | ICD-10-CM | POA: Diagnosis not present

## 2021-11-13 DIAGNOSIS — Z1231 Encounter for screening mammogram for malignant neoplasm of breast: Secondary | ICD-10-CM | POA: Diagnosis not present

## 2021-11-13 LAB — HM MAMMOGRAPHY

## 2021-11-16 ENCOUNTER — Encounter: Payer: Self-pay | Admitting: Family Medicine

## 2021-11-20 ENCOUNTER — Other Ambulatory Visit: Payer: Self-pay | Admitting: Family Medicine

## 2021-11-20 DIAGNOSIS — L659 Nonscarring hair loss, unspecified: Secondary | ICD-10-CM

## 2021-11-20 DIAGNOSIS — I10 Essential (primary) hypertension: Secondary | ICD-10-CM

## 2021-11-20 DIAGNOSIS — I4892 Unspecified atrial flutter: Secondary | ICD-10-CM

## 2021-11-24 ENCOUNTER — Other Ambulatory Visit: Payer: Self-pay | Admitting: Family Medicine

## 2021-11-24 DIAGNOSIS — L659 Nonscarring hair loss, unspecified: Secondary | ICD-10-CM

## 2021-11-24 DIAGNOSIS — I4892 Unspecified atrial flutter: Secondary | ICD-10-CM

## 2021-11-24 DIAGNOSIS — I10 Essential (primary) hypertension: Secondary | ICD-10-CM

## 2022-01-25 DIAGNOSIS — M2560 Stiffness of unspecified joint, not elsewhere classified: Secondary | ICD-10-CM | POA: Diagnosis not present

## 2022-01-25 DIAGNOSIS — M79642 Pain in left hand: Secondary | ICD-10-CM | POA: Diagnosis not present

## 2022-01-25 DIAGNOSIS — R202 Paresthesia of skin: Secondary | ICD-10-CM | POA: Diagnosis not present

## 2022-01-25 DIAGNOSIS — G5602 Carpal tunnel syndrome, left upper limb: Secondary | ICD-10-CM | POA: Diagnosis not present

## 2022-01-25 DIAGNOSIS — M545 Low back pain, unspecified: Secondary | ICD-10-CM | POA: Diagnosis not present

## 2022-01-25 DIAGNOSIS — R200 Anesthesia of skin: Secondary | ICD-10-CM | POA: Diagnosis not present

## 2022-01-25 DIAGNOSIS — G5603 Carpal tunnel syndrome, bilateral upper limbs: Secondary | ICD-10-CM | POA: Diagnosis not present

## 2022-01-25 DIAGNOSIS — E8581 Light chain (AL) amyloidosis: Secondary | ICD-10-CM | POA: Diagnosis not present

## 2022-01-25 DIAGNOSIS — M8580 Other specified disorders of bone density and structure, unspecified site: Secondary | ICD-10-CM | POA: Diagnosis not present

## 2022-02-19 ENCOUNTER — Other Ambulatory Visit: Payer: Self-pay | Admitting: Family Medicine

## 2022-02-19 DIAGNOSIS — I4892 Unspecified atrial flutter: Secondary | ICD-10-CM

## 2022-02-19 DIAGNOSIS — L659 Nonscarring hair loss, unspecified: Secondary | ICD-10-CM

## 2022-02-19 DIAGNOSIS — I10 Essential (primary) hypertension: Secondary | ICD-10-CM

## 2022-02-26 ENCOUNTER — Encounter: Payer: Medicare HMO | Admitting: Family Medicine

## 2022-03-03 ENCOUNTER — Encounter: Payer: Medicare HMO | Admitting: Family Medicine

## 2022-03-10 ENCOUNTER — Encounter: Payer: Self-pay | Admitting: Family Medicine

## 2022-03-10 ENCOUNTER — Ambulatory Visit (INDEPENDENT_AMBULATORY_CARE_PROVIDER_SITE_OTHER): Payer: Medicare HMO | Admitting: Family Medicine

## 2022-03-10 VITALS — BP 120/70 | HR 97 | Temp 98.0°F | Resp 17 | Ht 62.0 in | Wt 135.5 lb

## 2022-03-10 DIAGNOSIS — M8580 Other specified disorders of bone density and structure, unspecified site: Secondary | ICD-10-CM

## 2022-03-10 DIAGNOSIS — I10 Essential (primary) hypertension: Secondary | ICD-10-CM

## 2022-03-10 DIAGNOSIS — M79604 Pain in right leg: Secondary | ICD-10-CM

## 2022-03-10 DIAGNOSIS — E785 Hyperlipidemia, unspecified: Secondary | ICD-10-CM | POA: Diagnosis not present

## 2022-03-10 DIAGNOSIS — M79605 Pain in left leg: Secondary | ICD-10-CM

## 2022-03-10 DIAGNOSIS — L659 Nonscarring hair loss, unspecified: Secondary | ICD-10-CM

## 2022-03-10 DIAGNOSIS — E859 Amyloidosis, unspecified: Secondary | ICD-10-CM

## 2022-03-10 DIAGNOSIS — Z0000 Encounter for general adult medical examination without abnormal findings: Secondary | ICD-10-CM

## 2022-03-10 DIAGNOSIS — I4892 Unspecified atrial flutter: Secondary | ICD-10-CM | POA: Diagnosis not present

## 2022-03-10 DIAGNOSIS — I480 Paroxysmal atrial fibrillation: Secondary | ICD-10-CM | POA: Diagnosis not present

## 2022-03-10 LAB — BASIC METABOLIC PANEL WITH GFR
BUN: 16 mg/dL (ref 6–23)
CO2: 30 meq/L (ref 19–32)
Calcium: 10 mg/dL (ref 8.4–10.5)
Chloride: 99 meq/L (ref 96–112)
Creatinine, Ser: 0.7 mg/dL (ref 0.40–1.20)
GFR: 85.76 mL/min
Glucose, Bld: 100 mg/dL — ABNORMAL HIGH (ref 70–99)
Potassium: 3.8 meq/L (ref 3.5–5.1)
Sodium: 138 meq/L (ref 135–145)

## 2022-03-10 LAB — CBC WITH DIFFERENTIAL/PLATELET
Basophils Absolute: 0 10*3/uL (ref 0.0–0.1)
Basophils Relative: 1.1 % (ref 0.0–3.0)
Eosinophils Absolute: 0.2 10*3/uL (ref 0.0–0.7)
Eosinophils Relative: 5.6 % — ABNORMAL HIGH (ref 0.0–5.0)
HCT: 43.2 % (ref 36.0–46.0)
Hemoglobin: 14.5 g/dL (ref 12.0–15.0)
Lymphocytes Relative: 32.3 % (ref 12.0–46.0)
Lymphs Abs: 1.4 10*3/uL (ref 0.7–4.0)
MCHC: 33.5 g/dL (ref 30.0–36.0)
MCV: 89.9 fl (ref 78.0–100.0)
Monocytes Absolute: 0.4 10*3/uL (ref 0.1–1.0)
Monocytes Relative: 8.6 % (ref 3.0–12.0)
Neutro Abs: 2.3 10*3/uL (ref 1.4–7.7)
Neutrophils Relative %: 52.4 % (ref 43.0–77.0)
Platelets: 276 10*3/uL (ref 150.0–400.0)
RBC: 4.8 Mil/uL (ref 3.87–5.11)
RDW: 13.4 % (ref 11.5–15.5)
WBC: 4.3 10*3/uL (ref 4.0–10.5)

## 2022-03-10 LAB — HEPATIC FUNCTION PANEL
ALT: 20 U/L (ref 0–35)
AST: 19 U/L (ref 0–37)
Albumin: 4.5 g/dL (ref 3.5–5.2)
Alkaline Phosphatase: 75 U/L (ref 39–117)
Bilirubin, Direct: 0.2 mg/dL (ref 0.0–0.3)
Total Bilirubin: 1.5 mg/dL — ABNORMAL HIGH (ref 0.2–1.2)
Total Protein: 7.3 g/dL (ref 6.0–8.3)

## 2022-03-10 LAB — LIPID PANEL
Cholesterol: 183 mg/dL (ref 0–200)
HDL: 63 mg/dL (ref >39.00)
LDL Cholesterol: 103 mg/dL — ABNORMAL HIGH (ref 0–99)
NonHDL: 119.97
Total CHOL/HDL Ratio: 3
Triglycerides: 85 mg/dL (ref 0.0–149.0)
VLDL: 17 mg/dL (ref 0.0–40.0)

## 2022-03-10 LAB — TSH: TSH: 2.06 u[IU]/mL (ref 0.35–5.50)

## 2022-03-10 LAB — VITAMIN D 25 HYDROXY (VIT D DEFICIENCY, FRACTURES): VITD: 51.39 ng/mL (ref 30.00–100.00)

## 2022-03-10 MED ORDER — FINASTERIDE 5 MG PO TABS: ORAL_TABLET | ORAL | 3 refills | Status: DC

## 2022-03-10 MED ORDER — CARVEDILOL 12.5 MG PO TABS: ORAL_TABLET | ORAL | 3 refills | Status: DC

## 2022-03-10 MED ORDER — TERBINAFINE HCL 250 MG PO TABS: 250.0000 mg | ORAL_TABLET | Freq: Every day | ORAL | 0 refills | Status: DC

## 2022-03-10 MED ORDER — HYDROCHLOROTHIAZIDE 25 MG PO TABS: 25.0000 mg | ORAL_TABLET | Freq: Every day | ORAL | 3 refills | Status: DC

## 2022-03-10 MED ORDER — ATORVASTATIN CALCIUM 10 MG PO TABS: 10.0000 mg | ORAL_TABLET | Freq: Every day | ORAL | 3 refills | Status: DC

## 2022-03-10 MED ORDER — IBUPROFEN 800 MG PO TABS: 800.0000 mg | ORAL_TABLET | Freq: Three times a day (TID) | ORAL | 0 refills | Status: DC | PRN

## 2022-03-10 NOTE — Progress Notes (Signed)
   Subjective:    Patient ID: Chelsea Morgan, female    DOB: 1951-01-02, 72 y.o.   MRN: [id number]  HPI CPE- UTD on mammo, colonoscopy, Tdap, flu, PNA  Patient Care Team    Relationship Specialty Notifications Start End  Midge Minium, MD PCP - General Family Medicine  09/15/16   Elza Rafter, MD Consulting Physician Family Medicine  01/29/19   Iran Planas, MD Consulting Physician Orthopedic Surgery  01/29/19   Jerelyn Charles, MD Consulting Physician Obstetrics  01/29/19      Health Maintenance  Topic Date Due   Medicare Annual Wellness (AWV)  07/24/2022   MAMMOGRAM  11/14/2022   DTaP/Tdap/Td (2 - Td or Tdap) 08/08/2023   COLONOSCOPY (Pts 45-73yrs Insurance coverage will need to be confirmed)  11/27/2024   Pneumonia Vaccine 86+ Years old  Completed   INFLUENZA VACCINE  Completed   DEXA SCAN  Completed   Hepatitis C Screening  Completed   Zoster Vaccines- Shingrix  Completed   HPV VACCINES  Aged Out   COVID-19 Vaccine  Discontinued      Review of Systems Patient reports no vision/ hearing changes, adenopathy,fever, weight change, swallowing issues, chest pain, palpitations, edema, persistant/recurrent cough, hemoptysis, dyspnea (rest/exertional/paroxysmal nocturnal), gastrointestinal bleeding (melena, rectal bleeding), abdominal pain, significant heartburn, bowel changes, GU symptoms (dysuria, hematuria, incontinence), Gyn symptoms (abnormal  bleeding, pain),  syncope, focal weakness, memory loss, numbness & tingling, skin/hair/nail changes, abnormal bruising or bleeding, anxiety, or depression.   + pain in back and legs- taking ibuprofen 800mg  nightly and tylenol to help her sleep.  Pt is interested in seeing Ortho at this time.  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    Objective:   Physical Exam General Appearance:    Alert, cooperative, no distress, appears stated age  Head:    Normocephalic, without obvious abnormality, atraumatic  Eyes:    PERRL,  conjunctiva/corneas clear, EOM's intact both eyes  Ears:    Normal TM's and external ear canals, both ears  Nose:   Nares normal, septum midline, mucosa normal, no drainage    or sinus tenderness  Throat:   Lips, mucosa, and tongue normal; teeth and gums normal  Neck:   Supple, symmetrical, trachea midline, no adenopathy;    Thyroid: no enlargement/tenderness/nodules  Back:     Symmetric, no curvature, ROM normal, no CVA tenderness  Lungs:     Clear to auscultation bilaterally, respirations unlabored  Chest Wall:    No tenderness or deformity   Heart:    Regular rate but irregular rhythm, S1 and S2 normal, no murmur, rub or gallop  Breast Exam:    Deferred to mammo  Abdomen:     Soft, non-tender, bowel sounds active all four quadrants,    no masses, no organomegaly  Genitalia:    Deferred  Rectal:    Extremities:   Extremities normal, atraumatic, no cyanosis or edema  Pulses:   2+ and symmetric all extremities  Skin:   Skin color, texture, turgor normal, no rashes or lesions  Lymph nodes:   Cervical, supraclavicular, and axillary nodes normal  Neurologic:   CNII-XII intact, normal strength, sensation and reflexes    throughout          Assessment & Plan:

## 2022-03-10 NOTE — Patient Instructions (Addendum)
Schedule a lab visit in 6 weeks to recheck liver functions Follow up w/ me in 6 months to recheck cholesterol and blood pressure START the Terbinafine once daily w/ food to treat the nail fungus We'll call you to set up your Orthopedic appt for the leg pain Call and schedule a dermatology appt at your convenience Call with any questions or concerns Stay Safe!  Stay Healthy! Happy New Year!!

## 2022-03-10 NOTE — Assessment & Plan Note (Signed)
Follows w/ [facility].  Not currently on treatment and no signs of active disease

## 2022-03-10 NOTE — Assessment & Plan Note (Signed)
Currently asymptomatic.  On Coreg BID.

## 2022-03-11 ENCOUNTER — Telehealth: Payer: Self-pay

## 2022-03-11 NOTE — Telephone Encounter (Signed)
-----   Message from Katherine E Tabori, MD sent at 03/11/2022  7:17 AM EST ----- Labs are stable and look good!  No changes at this time 

## 2022-03-11 NOTE — Assessment & Plan Note (Signed)
Chronic problem.  Check labs.  Adjust meds prn  

## 2022-03-11 NOTE — Telephone Encounter (Signed)
Left lab results on pt VM 

## 2022-03-11 NOTE — Assessment & Plan Note (Signed)
UTD on DEXA.  Check Vit D level and replete prn. 

## 2022-03-11 NOTE — Assessment & Plan Note (Signed)
Pt's PE WNL w/ exception of chronic hoarseness and is unchanged from previous.  UTD on mammo, colonoscopy, Tdap, flu, PNA.  Check labs.  Anticipatory guidance provided.

## 2022-03-26 NOTE — Progress Notes (Signed)
   PCP:  Midge Minium, MD Primary Cardiologist: None Electrophysiologist: Cristopher Peru, MD   Chelsea Morgan is a 72 y.o. female with medical h/o AF, AFL s/p flutter ablation, HLD, and HTN seen today for Cristopher Peru, MD for routine electrophysiology followup. Since last being seen in our clinic the patient reports doing very well.  she denies chest pain, palpitations, dyspnea, PND, orthopnea, nausea, vomiting, dizziness, syncope, edema, weight gain, or early satiety.   Past Medical History:  Diagnosis Date   ALLERGIC RHINITIS 01/12/2008   Atrial flutter ([place])    Blood transfusion without reported diagnosis 1981   after ectopic pregnancy   CAROTID BRUITS, BILATERAL 04/01/2009   Essential hypertension, benign 01/12/2008   HYPERLIPIDEMIA 05/02/2007   Rheumatic fever     Current Outpatient Medications  Medication Instructions   atorvastatin (LIPITOR) 10 mg, Oral, Daily   b complex vitamins capsule 1 capsule, Oral, Daily   BIOTIN 5000 PO 1 tablet, Daily   carvedilol (COREG) 12.5 MG tablet Take 1/2 (one-half) tablet by mouth twice daily   EPINEPHrine (EPI-PEN) 0.3 mg, Intramuscular,  Once,     finasteride (PROSCAR) 5 MG tablet Take 1/2 (one-half) tablet by mouth once daily   hydrochlorothiazide (HYDRODIURIL) 25 mg, Oral, Daily   ibuprofen (ADVIL) 800 mg, Oral, Every 8 hours PRN   Magnesium 250 MG TABS 1 tablet, 2 times daily   Omega-3 Fatty Acids (FISH OIL) 1360 MG CAPS Oral, Daily   POTASSIUM PO 1 capsule, Oral, Daily, OTC    terbinafine (LAMISIL) 250 mg, Oral, Daily    Physical Exam: Vitals:   03/30/22 1007  BP: 138/78  Pulse: 67  SpO2: 99%  Weight: 136 lb (61.7 kg)  Height: 5\' 2"  (1.575 m)    GEN- NAD. A&O x 3. Normal affect. HEENT: normocephalic, atraumatic Lungs- CTAB, Normal effort Heart- Regular rate and rhythm, No M/G/R Extremities- No peripheral edema. no clubbing or cyanosis; Skin- warm and dry, no rash or lesion  EKG is ordered today. Personal review shows  AF at 87 bpm  Additional studies reviewed include: Previous EP notes.   Assessment and Plan:  1. Permanent atrial fibrillation Rates controlled.  She is not on [facility] despite CHA2DS2/VASc of at least 3. We discussed the risks and benefits at lengths today. She would potentially be interested in [facility].   I will reach out to Dr. Quentin Ore to see if she would potentially be a candidate.   2. H/o Atrial flutter Has not had recurrent AFL s/p ablation  3. 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  Echo was reassuring.   Follow up with Dr. Lovena Le in 12 months, Sooner with Dr. Quentin Ore if potential [facility] candidate.  Shirley Friar, PA-C  03/30/22 10:25 AM

## 2022-03-30 ENCOUNTER — Ambulatory Visit: Payer: Medicare HMO | Attending: Student | Admitting: Student

## 2022-03-30 ENCOUNTER — Encounter: Payer: Self-pay | Admitting: Student

## 2022-03-30 VITALS — BP 138/78 | HR 67 | Ht 62.0 in | Wt 136.0 lb

## 2022-03-30 DIAGNOSIS — I480 Paroxysmal atrial fibrillation: Secondary | ICD-10-CM | POA: Diagnosis not present

## 2022-03-30 DIAGNOSIS — I10 Essential (primary) hypertension: Secondary | ICD-10-CM | POA: Diagnosis not present

## 2022-03-30 NOTE — Patient Instructions (Signed)
Medication Instructions:   Your physician recommends that you continue on your current medications as directed. Please refer to the Current Medication list given to you today.   *If you need a refill on your cardiac medications before your next appointment, please call your pharmacy*   Lab Work:  None ordered.  If you have labs (blood work) drawn today and your tests are completely normal, you will receive your results only by: MyChart Message (if you have MyChart) OR A paper copy in the mail If you have any lab test that is abnormal or we need to change your treatment, we will call you to review the results.   Testing/Procedures:  None ordered   Follow-Up: At [facility], you and your health needs are our priority.  As part of our continuing mission to provide you with exceptional heart care, we have created designated Provider Care Teams.  These Care Teams include your primary Cardiologist (physician) and Advanced Practice Providers (APPs -  Physician Assistants and Nurse Practitioners) who all work together to provide you with the care you need, when you need it.  We recommend signing up for the patient portal called "MyChart".  Sign up information is provided on this After Visit Summary.  MyChart is used to connect with patients for Virtual Visits (Telemedicine).  Patients are able to view lab/test results, encounter notes, upcoming appointments, etc.  Non-urgent messages can be sent to your provider as well.   To learn more about what you can do with MyChart, go to [email or web address].    Your next appointment:   1 year(s)  Provider:   Gregg Taylor, MD    Other Instructions  Your physician wants you to follow-up in: 1 year with Dr. Taylor.  You will receive a reminder letter in the mail two months in advance. If you don't receive a letter, please call our office to schedule the follow-up appointment.   

## 2022-03-31 DIAGNOSIS — M542 Cervicalgia: Secondary | ICD-10-CM | POA: Diagnosis not present

## 2022-03-31 DIAGNOSIS — I700 Atherosclerosis of aorta: Secondary | ICD-10-CM | POA: Diagnosis not present

## 2022-03-31 DIAGNOSIS — M5416 Radiculopathy, lumbar region: Secondary | ICD-10-CM | POA: Diagnosis not present

## 2022-04-05 DIAGNOSIS — M542 Cervicalgia: Secondary | ICD-10-CM | POA: Diagnosis not present

## 2022-04-05 DIAGNOSIS — M5459 Other low back pain: Secondary | ICD-10-CM | POA: Diagnosis not present

## 2022-04-06 DIAGNOSIS — M542 Cervicalgia: Secondary | ICD-10-CM | POA: Diagnosis not present

## 2022-04-06 DIAGNOSIS — M5459 Other low back pain: Secondary | ICD-10-CM | POA: Diagnosis not present

## 2022-04-12 DIAGNOSIS — M5459 Other low back pain: Secondary | ICD-10-CM | POA: Diagnosis not present

## 2022-04-12 DIAGNOSIS — M542 Cervicalgia: Secondary | ICD-10-CM | POA: Diagnosis not present

## 2022-04-14 DIAGNOSIS — M542 Cervicalgia: Secondary | ICD-10-CM | POA: Diagnosis not present

## 2022-04-14 DIAGNOSIS — M5459 Other low back pain: Secondary | ICD-10-CM | POA: Diagnosis not present

## 2022-04-19 DIAGNOSIS — M5459 Other low back pain: Secondary | ICD-10-CM | POA: Diagnosis not present

## 2022-04-19 DIAGNOSIS — M542 Cervicalgia: Secondary | ICD-10-CM | POA: Diagnosis not present

## 2022-04-22 ENCOUNTER — Other Ambulatory Visit: Payer: Self-pay | Admitting: Family Medicine

## 2022-04-22 ENCOUNTER — Telehealth: Payer: Self-pay

## 2022-04-22 ENCOUNTER — Other Ambulatory Visit (INDEPENDENT_AMBULATORY_CARE_PROVIDER_SITE_OTHER): Payer: Medicare HMO

## 2022-04-22 DIAGNOSIS — E785 Hyperlipidemia, unspecified: Secondary | ICD-10-CM | POA: Diagnosis not present

## 2022-04-22 LAB — HEPATIC FUNCTION PANEL
ALT: 27 U/L (ref 0–35)
AST: 24 U/L (ref 0–37)
Albumin: 4.3 g/dL (ref 3.5–5.2)
Alkaline Phosphatase: 69 U/L (ref 39–117)
Bilirubin, Direct: 0.1 mg/dL (ref 0.0–0.3)
Total Bilirubin: 0.7 mg/dL (ref 0.2–1.2)
Total Protein: 7 g/dL (ref 6.0–8.3)

## 2022-04-22 NOTE — Telephone Encounter (Signed)
-----   Message from Midge Minium, MD sent at 04/22/2022  3:51 PM EST ----- Liver functions look great!  No changes at this time

## 2022-04-22 NOTE — Telephone Encounter (Signed)
Informed pt of lab results  

## 2022-04-22 NOTE — Progress Notes (Signed)
Lab orders entered

## 2022-04-26 DIAGNOSIS — M5459 Other low back pain: Secondary | ICD-10-CM | POA: Diagnosis not present

## 2022-04-26 DIAGNOSIS — M542 Cervicalgia: Secondary | ICD-10-CM | POA: Diagnosis not present

## 2022-04-28 DIAGNOSIS — M5459 Other low back pain: Secondary | ICD-10-CM | POA: Diagnosis not present

## 2022-04-28 DIAGNOSIS — M542 Cervicalgia: Secondary | ICD-10-CM | POA: Diagnosis not present

## 2022-05-11 DIAGNOSIS — M5459 Other low back pain: Secondary | ICD-10-CM | POA: Diagnosis not present

## 2022-05-11 DIAGNOSIS — M542 Cervicalgia: Secondary | ICD-10-CM | POA: Diagnosis not present

## 2022-05-12 DIAGNOSIS — M542 Cervicalgia: Secondary | ICD-10-CM | POA: Diagnosis not present

## 2022-05-12 DIAGNOSIS — M5459 Other low back pain: Secondary | ICD-10-CM | POA: Diagnosis not present

## 2022-05-17 ENCOUNTER — Other Ambulatory Visit: Payer: Self-pay | Admitting: Family Medicine

## 2022-05-17 DIAGNOSIS — M5459 Other low back pain: Secondary | ICD-10-CM | POA: Diagnosis not present

## 2022-05-17 DIAGNOSIS — M542 Cervicalgia: Secondary | ICD-10-CM | POA: Diagnosis not present

## 2022-05-17 DIAGNOSIS — E785 Hyperlipidemia, unspecified: Secondary | ICD-10-CM

## 2022-05-19 DIAGNOSIS — M5459 Other low back pain: Secondary | ICD-10-CM | POA: Diagnosis not present

## 2022-05-19 DIAGNOSIS — M542 Cervicalgia: Secondary | ICD-10-CM | POA: Diagnosis not present

## 2022-05-20 ENCOUNTER — Other Ambulatory Visit: Payer: Self-pay | Admitting: Family Medicine

## 2022-06-07 DIAGNOSIS — M5459 Other low back pain: Secondary | ICD-10-CM | POA: Diagnosis not present

## 2022-06-07 DIAGNOSIS — M542 Cervicalgia: Secondary | ICD-10-CM | POA: Diagnosis not present

## 2022-06-23 ENCOUNTER — Telehealth: Payer: Self-pay | Admitting: Family Medicine

## 2022-06-23 NOTE — Telephone Encounter (Signed)
Called patient to schedule Medicare Annual Wellness Visit (AWV). Left message for patient to call back and schedule Medicare Annual Wellness Visit (AWV).  Last date of AWV: 07/23/2021   Please schedule an AWVS appointment at any time with [facility] ANNUAL WELLNESS VISIT.  If any questions, please contact me at [phone].    Thank you,  [facility] Support [facility] Direct dial  [phone]

## 2022-07-07 ENCOUNTER — Telehealth: Payer: Self-pay | Admitting: Family Medicine

## 2022-07-07 NOTE — Telephone Encounter (Signed)
Called patient to schedule Medicare Annual Wellness Visit (AWV). Left message for patient to call back and schedule Medicare Annual Wellness Visit (AWV).  Last date of AWV: 07/23/2021   Please schedule an AWVS appointment at any time with [facility] ANNUAL WELLNESS VISIT.  If any questions, please contact me at [phone].    Thank you,  Jerrine Urschel  [facility] Support [facility] Direct dial  [phone]   

## 2022-07-20 ENCOUNTER — Telehealth: Payer: Self-pay | Admitting: Family Medicine

## 2022-07-20 NOTE — Telephone Encounter (Signed)
Contacted Luna Kitchens to schedule their annual wellness visit. Appointment made for 07/28/2022.  Thank you,  [facility] Support [facility] Direct dial  [phone]

## 2022-07-28 ENCOUNTER — Ambulatory Visit (INDEPENDENT_AMBULATORY_CARE_PROVIDER_SITE_OTHER): Payer: Medicare HMO | Admitting: *Deleted

## 2022-07-28 DIAGNOSIS — Z0000 Encounter for general adult medical examination without abnormal findings: Secondary | ICD-10-CM

## 2022-07-28 DIAGNOSIS — Z780 Asymptomatic menopausal state: Secondary | ICD-10-CM

## 2022-07-28 NOTE — Patient Instructions (Signed)
Chelsea Morgan , Thank you for taking time to come for your Medicare Wellness Visit. I appreciate your ongoing commitment to your health goals. Please review the following plan we discussed and let me know if I can assist you in the future.   Screening recommendations/referrals: Colonoscopy: up to date Mammogram: up to date Bone Density: up to date Recommended yearly ophthalmology/optometry visit for glaucoma screening and checkup Recommended yearly dental visit for hygiene and checkup  Vaccinations: Influenza vaccine: up to date Pneumococcal vaccine: up to date Tdap vaccine: up to date Shingles vaccine: up to date    Advanced directives: not on file     Preventive Care 65 Years and Older, Female Preventive care refers to lifestyle choices and visits with your health care provider that can promote health and wellness. What does preventive care include? A yearly physical exam. This is also called an annual well check. Dental exams once or twice a year. Routine eye exams. Ask your health care provider how often you should have your eyes checked. Personal lifestyle choices, including: Daily care of your teeth and gums. Regular physical activity. Eating a healthy diet. Avoiding tobacco and drug use. Limiting alcohol use. Practicing safe sex. Taking low-dose aspirin every day. Taking vitamin and mineral supplements as recommended by your health care provider. What happens during an annual well check? The services and screenings done by your health care provider during your annual well check will depend on your age, overall health, lifestyle risk factors, and family history of disease. Counseling  Your health care provider may ask you questions about your: Alcohol use. Tobacco use. Drug use. Emotional well-being. Home and relationship well-being. Sexual activity. Eating habits. History of falls. Memory and ability to understand (cognition). Work and work  Astronomer. Reproductive health. Screening  You may have the following tests or measurements: Height, weight, and BMI. Blood pressure. Lipid and cholesterol levels. These may be checked every 5 years, or more frequently if you are over 77 years old. Skin check. Lung cancer screening. You may have this screening every year starting at age 69 if you have a 30-pack-year history of smoking and currently smoke or have quit within the past 15 years. Fecal occult blood test (FOBT) of the stool. You may have this test every year starting at age 74. Flexible sigmoidoscopy or colonoscopy. You may have a sigmoidoscopy every 5 years or a colonoscopy every 10 years starting at age 55. Hepatitis C blood test. Hepatitis B blood test. Sexually transmitted disease (STD) testing. Diabetes screening. This is done by checking your blood sugar (glucose) after you have not eaten for a while (fasting). You may have this done every 1-3 years. Bone density scan. This is done to screen for osteoporosis. You may have this done starting at age 96. Mammogram. This may be done every 1-2 years. Talk to your health care provider about how often you should have regular mammograms. Talk with your health care provider about your test results, treatment options, and if necessary, the need for more tests. Vaccines  Your health care provider may recommend certain vaccines, such as: Influenza vaccine. This is recommended every year. Tetanus, diphtheria, and acellular pertussis (Tdap, Td) vaccine. You may need a Td booster every 10 years. Zoster vaccine. You may need this after age 37. Pneumococcal 13-valent conjugate (PCV13) vaccine. One dose is recommended after age 6. Pneumococcal polysaccharide (PPSV23) vaccine. One dose is recommended after age 97. Talk to your health care provider about which screenings and vaccines you need  and how often you need them. This information is not intended to replace advice given to you by  your health care provider. Make sure you discuss any questions you have with your health care provider. Document Released: 03/21/2015 Document Revised: 11/12/2015 Document Reviewed: 12/24/2014 Elsevier Interactive Patient Education  2017 [employer].  Fall Prevention in the Home Falls can cause injuries. They can happen to people of all ages. There are many things you can do to make your home safe and to help prevent falls. What can I do on the outside of my home? Regularly fix the edges of walkways and driveways and fix any cracks. Remove anything that might make you trip as you walk through a door, such as a raised step or threshold. Trim any bushes or trees on the path to your home. Use bright outdoor lighting. Clear any walking paths of anything that might make someone trip, such as rocks or tools. Regularly check to see if handrails are loose or broken. Make sure that both sides of any steps have handrails. Any raised decks and porches should have guardrails on the edges. Have any leaves, snow, or ice cleared regularly. Use sand or salt on walking paths during winter. Clean up any spills in your garage right away. This includes oil or grease spills. What can I do in the bathroom? Use night lights. Install grab bars by the toilet and in the tub and shower. Do not use towel bars as grab bars. Use non-skid mats or decals in the tub or shower. If you need to sit down in the shower, use a plastic, non-slip stool. Keep the floor dry. Clean up any water that spills on the floor as soon as it happens. Remove soap buildup in the tub or shower regularly. Attach bath mats securely with double-sided non-slip rug tape. Do not have throw rugs and other things on the floor that can make you trip. What can I do in the bedroom? Use night lights. Make sure that you have a light by your bed that is easy to reach. Do not use any sheets or blankets that are too big for your bed. They should not hang  down onto the floor. Have a firm chair that has side arms. You can use this for support while you get dressed. Do not have throw rugs and other things on the floor that can make you trip. What can I do in the kitchen? Clean up any spills right away. Avoid walking on wet floors. Keep items that you use a lot in easy-to-reach places. If you need to reach something above you, use a strong step stool that has a grab bar. Keep electrical cords out of the way. Do not use floor polish or wax that makes floors slippery. If you must use wax, use non-skid floor wax. Do not have throw rugs and other things on the floor that can make you trip. What can I do with my stairs? Do not leave any items on the stairs. Make sure that there are handrails on both sides of the stairs and use them. Fix handrails that are broken or loose. Make sure that handrails are as long as the stairways. Check any carpeting to make sure that it is firmly attached to the stairs. Fix any carpet that is loose or worn. Avoid having throw rugs at the top or bottom of the stairs. If you do have throw rugs, attach them to the floor with carpet tape. Make sure that you  have a light switch at the top of the stairs and the bottom of the stairs. If you do not have them, ask someone to add them for you. What else can I do to help prevent falls? Wear shoes that: Do not have high heels. Have rubber bottoms. Are comfortable and fit you well. Are closed at the toe. Do not wear sandals. If you use a stepladder: Make sure that it is fully opened. Do not climb a closed stepladder. Make sure that both sides of the stepladder are locked into place. Ask someone to hold it for you, if possible. Clearly mark and make sure that you can see: Any grab bars or handrails. First and last steps. Where the edge of each step is. Use tools that help you move around (mobility aids) if they are needed. These  include: Canes. Walkers. Scooters. Crutches. Turn on the lights when you go into a dark area. Replace any light bulbs as soon as they burn out. Set up your furniture so you have a clear path. Avoid moving your furniture around. If any of your floors are uneven, fix them. If there are any pets around you, be aware of where they are. Review your medicines with your doctor. Some medicines can make you feel dizzy. This can increase your chance of falling. Ask your doctor what other things that you can do to help prevent falls. This information is not intended to replace advice given to you by your health care provider. Make sure you discuss any questions you have with your health care provider. Document Released: 12/19/2008 Document Revised: 07/31/2015 Document Reviewed: 03/29/2014 Elsevier Interactive Patient Education  2017 [employer].

## 2022-07-28 NOTE — Progress Notes (Signed)
Subjective:   Chelsea Morgan is a 73 y.o. female who presents for Medicare Annual (Subsequent) preventive examination.  I connected with  Chelsea Morgan on 07/28/22 by a telephone enabled telemedicine application and verified that I am speaking with the correct person using two identifiers.   I discussed the limitations of evaluation and management by telemedicine. The patient expressed understanding and agreed to proceed.  Patient location: home  Provider location: telephone/home    Review of Systems     Cardiac Risk Factors include: advanced age (>46men, >55 women);hypertension     Objective:    Today's Vitals   07/28/22 1036  PainSc: 2    There is no height or weight on file to calculate BMI.     07/28/2022   10:42 AM 07/23/2021    9:54 AM 07/14/2020   12:50 PM 11/14/2014    7:42 AM 12/28/2013    2:00 PM  Advanced Directives  Does Patient Have a Medical Advance Directive? Yes Yes Yes Yes No  Type of Diplomatic Services operational officer Living will Healthcare Power of [place];Living will Living will;Healthcare Power of Attorney   Does patient want to make changes to medical advance directive? No - Patient declined      Copy of Healthcare Power of Attorney in Chart? No - copy requested  No - copy requested      Current Medications (verified) Outpatient Encounter Medications as of 07/28/2022  Medication Sig   atorvastatin (LIPITOR) 10 MG tablet Take 1 tablet (10 mg total) by mouth daily.   b complex vitamins capsule Take 1 capsule by mouth daily.   BIOTIN 5000 PO Take 1 tablet by mouth daily.    carvedilol (COREG) 12.5 MG tablet Take 1/2 (one-half) tablet by mouth twice daily   EPINEPHrine (EPI-PEN) 0.3 mg/0.3 mL DEVI Inject 0.3 mg into the muscle once.   finasteride (PROSCAR) 5 MG tablet Take 1/2 (one-half) tablet by mouth once daily   hydrochlorothiazide (HYDRODIURIL) 25 MG tablet Take 1 tablet (25 mg total) by mouth daily.   ibuprofen (ADVIL) 800 MG tablet  Take 1 tablet (800 mg total) by mouth every 8 (eight) hours as needed.   Magnesium 250 MG TABS Take 1 tablet by mouth 2 (two) times daily.   Omega-3 Fatty Acids (FISH OIL) 1360 MG CAPS Take by mouth daily.   POTASSIUM PO Take 1 capsule by mouth daily. OTC   terbinafine (LAMISIL) 250 MG tablet Take 1 tablet by mouth once daily   No facility-administered encounter medications on file as of 07/28/2022.    Allergies (verified) Sulfa antibiotics   History: Past Medical History:  Diagnosis Date   ALLERGIC RHINITIS 01/12/2008   Atrial flutter (HCC)    Blood transfusion without reported diagnosis 1981   after ectopic pregnancy   CAROTID BRUITS, BILATERAL 04/01/2009   Essential hypertension, benign 01/12/2008   HYPERLIPIDEMIA 05/02/2007   Rheumatic fever    Past Surgical History:  Procedure Laterality Date   ABLATION  12/28/2013   atrial flutter ablation by Dr Ladona Ridgel   ATRIAL FLUTTER ABLATION N/A 12/28/2013   Procedure: ATRIAL FLUTTER ABLATION;  Surgeon: Marinus Maw, MD;  Location[facility] CATH LAB;  Service: Cardiovascular;  Laterality: N/A;   CARPAL TUNNEL RELEASE Right 03/08/2005   CATARACT EXTRACTION Bilateral    SALPINGECTOMY  03/09/1979   Family History  Problem Relation Age of Onset   Cancer Mother        stomach   Esophageal cancer Mother 57  per pt esophageal ca at [facility]   Stroke Father    Cancer Paternal Aunt        colon   Colon cancer Paternal Aunt 60   Rectal cancer Neg Hx    Stomach cancer Neg Hx    Social History   Socioeconomic History   Marital status: Married    Spouse name: Not on file   Number of children: Not on file   Years of education: Not on file   Highest education level: Not on file  Occupational History   Not on file  Tobacco Use   Smoking status: Never   Smokeless tobacco: Never  Substance and Sexual Activity   Alcohol use: Yes    Alcohol/week: 0.0 standard drinks of alcohol    Comment: occasionally   Drug use: Not Currently     Comment: rare   Sexual activity: Not Currently  Other Topics Concern   Not on file  Social History Narrative   Not on file   Social Determinants of Health   Financial Resource Strain: Low Risk  (07/28/2022)   Overall Financial Resource Strain (CARDIA)    Difficulty of Paying Living Expenses: Not hard at all  Food Insecurity: No Food Insecurity (07/28/2022)   Hunger Vital Sign    Worried About Running Out of Food in the Last Year: Never true    Ran Out of Food in the Last Year: Never true  Transportation Needs: No Transportation Needs (07/28/2022)   PRAPARE - Administrator, Civil Service (Medical): No    Lack of Transportation (Non-Medical): No  Physical Activity: Insufficiently Active (07/28/2022)   Exercise Vital Sign    Days of Exercise per Week: 4 days    Minutes of Exercise per Session: 30 min  Stress: No Stress Concern Present (07/28/2022)   [employer] of Occupational Health - Occupational Stress Questionnaire    Feeling of Stress : Only a little  Social Connections: Moderately Integrated (07/28/2022)   Social Connection and Isolation Panel [NHANES]    Frequency of Communication with Friends and Family: More than three times a week    Frequency of Social Gatherings with Friends and Family: Once a week    Attends Religious Services: More than 4 times per year    Active Member of [employer] or Organizations: No    Attends Engineer, structural: Never    Marital Status: Married    Tobacco Counseling Counseling given: Not Answered   Clinical Intake:  Pre-visit preparation completed: Yes  Pain : 0-10 Pain Score: 2  Pain Location: Leg Pain Orientation: Posterior, Right Pain Descriptors / Indicators: Aching, Burning, Dull Pain Onset: More than a month ago Pain Frequency: Constant Effect of Pain on Daily Activities: tylenol     Nutritional Risks: None Diabetes: No  How often do you need to have someone help you when you read instructions,  pamphlets, or other written materials from your doctor or pharmacy?: 1 - Never  Diabetic?  no  Interpreter Needed?: No  Information entered by :: Remi Haggard LPN   Activities of Daily Living    07/28/2022   10:41 AM 03/10/2022   12:51 PM  In your present state of health, do you have any difficulty performing the following activities:  Hearing? 0 0  Vision? 0 0  Difficulty concentrating or making decisions? 0 0  Walking or climbing stairs? 0 0  Dressing or bathing? 0 0  Doing errands, shopping? 0 0  Preparing Food and eating ?  N   Using the Toilet? N   In the past six months, have you accidently leaked urine? N   Do you have problems with loss of bowel control? N   Managing your Medications? N   Managing your Finances? N   Housekeeping or managing your Housekeeping? N     Patient Care Team: Sheliah Hatch, MD as PCP - General (Family Medicine) Marinus Maw, MD as PCP - Electrophysiology (Cardiology) Coralie Carpen, MD as Consulting Physician (Family Medicine) Bradly Bienenstock, MD as Consulting Physician (Orthopedic Surgery) Marlow Baars, MD as Consulting Physician (Obstetrics)  Indicate any recent Medical Services you may have received from other than [facility] providers in the past year (date may be approximate).     Assessment:   This is a routine wellness examination for Chelsea Morgan.  Hearing/Vision screen Hearing Screening - Comments:: No trouble hearing Vision Screening - Comments:: [facility] Up to date  Dietary issues and exercise activities discussed: Current Exercise Habits: Home exercise routine, Type of exercise: walking, Time (Minutes): 25, Frequency (Times/Week): 3, Weekly Exercise (Minutes/Week): 75, Intensity: Mild   Goals Addressed             This Visit's Progress    Increase physical activity       Patient Stated   Not on track    Increase activity & drink more water       Depression Screen    07/28/2022   10:45 AM 03/10/2022   12:51 PM  08/27/2021    9:22 AM 07/23/2021    9:53 AM 02/23/2021    9:00 AM 08/18/2020   10:26 AM 07/14/2020   12:52 PM  PHQ 2/9 Scores  PHQ - 2 Score 2 0 0 0 0 0 0  PHQ- 9 Score 8 2 1  1  0     Fall Risk    07/28/2022   10:37 AM 03/10/2022   12:51 PM 08/27/2021    9:22 AM 07/23/2021    9:55 AM 02/23/2021    9:00 AM  Fall Risk   Falls in the past year? 1 1 0 0 0  Number falls in past yr: 0 1 0 0   Injury with Fall? 0 0 0 0   Risk for fall due to :  History of fall(s) No Fall Risks Impaired vision No Fall Risks  Follow up Falls evaluation completed;Education provided;Falls prevention discussed Falls evaluation completed Falls evaluation completed Falls prevention discussed Falls evaluation completed    FALL RISK PREVENTION PERTAINING TO THE HOME:  Any stairs in or around the home? Yes  If so, are there any without handrails? No  Home free of loose throw rugs in walkways, pet beds, electrical cords, etc? Yes  Adequate lighting in your home to reduce risk of falls? Yes   ASSISTIVE DEVICES UTILIZED TO PREVENT FALLS:  Life alert? No  Use of a cane, walker or w/c? No  Grab bars in the bathroom? No  Shower chair or bench in shower? No  Elevated toilet seat or a handicapped toilet? Yes   TIMED UP AND GO:  Was the test performed? No .    Cognitive Function:        07/28/2022   10:42 AM 07/23/2021    9:55 AM 07/14/2020   12:55 PM  6CIT Screen  What Year? 0 points 0 points 0 points  What month? 0 points 0 points 0 points  What time? 0 points 0 points 0 points  Count back from 20 0 points 0  points 0 points  Months in reverse 0 points 0 points 0 points  Repeat phrase 0 points 0 points 0 points  Total Score 0 points 0 points 0 points    Immunizations Immunization History  Administered Date(s) Administered   Influenza Nasal 12/05/2021   Influenza Split 02/05/2013, 12/06/2013, 02/05/2014, 12/07/2014, 01/14/2017, 12/21/2019   Influenza, High Dose Seasonal PF 12/21/2019, 12/10/2020    Influenza, Quadrivalent, Recombinant, Inj, Pf 12/21/2017, 12/20/2018   Influenza,inj,Quad PF,6+ Mos 01/14/2017   Influenza-Unspecified 02/05/2013, 12/06/2013, 02/05/2014, 12/07/2014, 01/14/2017, 12/21/2019   PFIZER Comirnaty(Gray Top)Covid-19 Tri-Sucrose Vaccine 04/12/2019, 05/08/2019   PFIZER(Purple Top)SARS-COV-2 Vaccination 04/12/2019, 05/08/2019   Pneumococcal Conjugate-13 09/03/2014   Pneumococcal Polysaccharide-23 09/15/2015   Tdap 08/07/2013   Zoster Recombinat (Shingrix) 10/30/2018, 01/10/2019   Zoster, Live 09/04/2013    TDAP status: Up to date  Flu Vaccine status: Up to date  Pneumococcal vaccine status: Up to date  Covid-19 vaccine status: Information provided on how to obtain vaccines.   Qualifies for Shingles Vaccine? No   Zostavax completed Yes   Shingrix Completed?: Yes  Screening Tests Health Maintenance  Topic Date Due   INFLUENZA VACCINE  10/07/2022   MAMMOGRAM  11/14/2022   Medicare Annual Wellness (AWV)  07/28/2023   DTaP/Tdap/Td (2 - Td or Tdap) 08/08/2023   COLONOSCOPY (Pts 45-30yrs Insurance coverage will need to be confirmed)  11/27/2024   Pneumonia Vaccine 58+ Years old  Completed   DEXA SCAN  Completed   Hepatitis C Screening  Completed   Zoster Vaccines- Shingrix  Completed   HPV VACCINES  Aged Out   COVID-19 Vaccine  Discontinued    Health Maintenance  There are no preventive care reminders to display for this patient.   Colorectal cancer screening: Type of screening: Colonoscopy. Completed 2016. Repeat every 10 years  Mammogram status: Completed 2023. Repeat every year  Bone Density status: Completed 2022. Results reflect: Bone density results: OSTEOPENIA. Repeat every 2 years.  Lung Cancer Screening: (Low Dose CT Chest recommended if Age 65-80 years, 30 pack-year currently smoking OR have quit w/in 15years.) does not qualify.   Lung Cancer Screening Referral:   Additional Screening:  Hepatitis C Screening: does not qualify;  Completed 2022  Vision Screening: Recommended annual ophthalmology exams for early detection of glaucoma and other disorders of the eye. Is the patient up to date with their annual eye exam?  Yes  Who is the provider or what is the name of the office in which the patient attends annual eye exams? [facility] If pt is not established with a provider, would they like to be referred to a provider to establish care? No .   Dental Screening: Recommended annual dental exams for proper oral hygiene  Community Resource Referral / Chronic Care Management: CRR required this visit?  No   CCM required this visit?  No      Plan:     I have personally reviewed and noted the following in the patient's chart:   Medical and social history Use of alcohol, tobacco or illicit drugs  Current medications and supplements including opioid prescriptions. Patient is not currently taking opioid prescriptions. Functional ability and status Nutritional status Physical activity Advanced directives List of other physicians Hospitalizations, surgeries, and ER visits in previous 12 months Vitals Screenings to include cognitive, depression, and falls Referrals and appointments  In addition, I have reviewed and discussed with patient certain preventive protocols, quality metrics, and best practice recommendations. A written personalized care plan for preventive services as well as general  preventive health recommendations were provided to patient.     Remi Haggard, LPN   1/61/0960   Nurse Notes:

## 2022-09-13 ENCOUNTER — Ambulatory Visit: Payer: Medicare HMO | Admitting: Family Medicine

## 2022-09-20 ENCOUNTER — Encounter: Payer: Self-pay | Admitting: Family Medicine

## 2022-09-20 ENCOUNTER — Ambulatory Visit (INDEPENDENT_AMBULATORY_CARE_PROVIDER_SITE_OTHER): Payer: Medicare HMO | Admitting: Family Medicine

## 2022-09-20 VITALS — BP 120/68 | HR 77 | Temp 98.0°F | Resp 17 | Ht 62.0 in | Wt 127.2 lb

## 2022-09-20 DIAGNOSIS — R413 Other amnesia: Secondary | ICD-10-CM | POA: Diagnosis not present

## 2022-09-20 DIAGNOSIS — I10 Essential (primary) hypertension: Secondary | ICD-10-CM | POA: Diagnosis not present

## 2022-09-20 DIAGNOSIS — R490 Dysphonia: Secondary | ICD-10-CM

## 2022-09-20 DIAGNOSIS — R634 Abnormal weight loss: Secondary | ICD-10-CM | POA: Diagnosis not present

## 2022-09-20 DIAGNOSIS — E785 Hyperlipidemia, unspecified: Secondary | ICD-10-CM

## 2022-09-20 LAB — CBC WITH DIFFERENTIAL/PLATELET
Basophils Absolute: 0 10*3/uL (ref 0.0–0.1)
Basophils Relative: 1 % (ref 0.0–3.0)
Eosinophils Absolute: 0.2 10*3/uL (ref 0.0–0.7)
Eosinophils Relative: 5.3 % — ABNORMAL HIGH (ref 0.0–5.0)
HCT: 43 % (ref 36.0–46.0)
Hemoglobin: 14.1 g/dL (ref 12.0–15.0)
Lymphocytes Relative: 31.4 % (ref 12.0–46.0)
Lymphs Abs: 1.3 10*3/uL (ref 0.7–4.0)
MCHC: 32.8 g/dL (ref 30.0–36.0)
MCV: 91 fl (ref 78.0–100.0)
Monocytes Absolute: 0.4 10*3/uL (ref 0.1–1.0)
Monocytes Relative: 10.3 % (ref 3.0–12.0)
Neutro Abs: 2.1 10*3/uL (ref 1.4–7.7)
Neutrophils Relative %: 52 % (ref 43.0–77.0)
Platelets: 253 10*3/uL (ref 150.0–400.0)
RBC: 4.73 Mil/uL (ref 3.87–5.11)
RDW: 13.6 % (ref 11.5–15.5)
WBC: 4.1 10*3/uL (ref 4.0–10.5)

## 2022-09-20 LAB — TSH: TSH: 1.86 u[IU]/mL (ref 0.35–5.50)

## 2022-09-20 LAB — HEPATIC FUNCTION PANEL
ALT: 21 U/L (ref 0–35)
AST: 19 U/L (ref 0–37)
Albumin: 4.4 g/dL (ref 3.5–5.2)
Alkaline Phosphatase: 75 U/L (ref 39–117)
Bilirubin, Direct: 0.3 mg/dL (ref 0.0–0.3)
Total Bilirubin: 1.6 mg/dL — ABNORMAL HIGH (ref 0.2–1.2)
Total Protein: 7.1 g/dL (ref 6.0–8.3)

## 2022-09-20 LAB — BASIC METABOLIC PANEL
BUN: 10 mg/dL (ref 6–23)
CO2: 31 mEq/L (ref 19–32)
Calcium: 10.3 mg/dL (ref 8.4–10.5)
Chloride: 102 mEq/L (ref 96–112)
Creatinine, Ser: 0.71 mg/dL (ref 0.40–1.20)
GFR: 84 mL/min (ref >60.00)
Glucose, Bld: 95 mg/dL (ref 70–99)
Potassium: 4.8 mEq/L (ref 3.5–5.1)
Sodium: 138 mEq/L (ref 135–145)

## 2022-09-20 LAB — LIPID PANEL
Cholesterol: 163 mg/dL (ref 0–200)
HDL: 60.7 mg/dL (ref >39.00)
LDL Cholesterol: 85 mg/dL (ref 0–99)
NonHDL: 101.99
Total CHOL/HDL Ratio: 3
Triglycerides: 85 mg/dL (ref 0.0–149.0)
VLDL: 17 mg/dL (ref 0.0–40.0)

## 2022-09-20 NOTE — Assessment & Plan Note (Signed)
Chronic problem.  Currently well controlled on Carvedilol 6.25mg  BID, hydrochlorothiazide 25mg  daily.  Asymptomatic.  Check labs due to diuretic use but no anticipated med changes.  Will follow.

## 2022-09-20 NOTE — Assessment & Plan Note (Signed)
 Chronic problem.  On Lipitor 10mg daily w/o difficulty.  Check labs.  Adjust meds prn  

## 2022-09-20 NOTE — Assessment & Plan Note (Signed)
Pt has known amyloidosis of vocal cords.  She feels her hoarseness is worsening and she would again benefit from speech tx.  Referral placed.

## 2022-09-20 NOTE — Progress Notes (Signed)
Subjective:    Patient ID: Chelsea Morgan, female    DOB: August 23, 1950, 72 y.o.   MRN: [id number]  HPI HTN- chronic problem, on Carvedilol 6.25mg  BID, hydrochlorothiazide 25mg  daily w/ good control.  No CP, SOB, HA's, visual changes, edema.  Hyperlipidemia- chronic problem, on Lipitor 10mg  daily.  No abd pain, N/V.  Overweight- pt is down 9 lbs since last visit.  Reports she has changed her diet.  Pt is walking regularly  Hoarseness- ongoing issue for pt due to Amyloid.  Benefited in the past from speech therapy.  Would like to resume.  Memory changes- pt told husband she would discuss this w/ me.  She reports she is frequently losing her keys and glasses.  Did get lost while driving the other day but it was bc she had to take a detour and she was unfamiliar.  No issues remembering names of friends or family.  No difficulty knowing what objects are used for.  No trouble following directions.  Review of Systems For ROS see HPI     Objective:   Physical Exam Vitals reviewed.  Constitutional:      General: She is not in acute distress.    Appearance: Normal appearance. She is well-developed. She is not ill-appearing.  HENT:     Head: Normocephalic and atraumatic.  Eyes:     Conjunctiva/sclera: Conjunctivae normal.     Pupils: Pupils are equal, round, and reactive to light.  Neck:     Thyroid: No thyromegaly.  Cardiovascular:     Rate and Rhythm: Normal rate and regular rhythm.     Pulses: Normal pulses.     Heart sounds: Normal heart sounds. No murmur heard. Pulmonary:     Effort: Pulmonary effort is normal. No respiratory distress.     Breath sounds: Normal breath sounds.  Abdominal:     General: There is no distension.     Palpations: Abdomen is soft.     Tenderness: There is no abdominal tenderness.  Musculoskeletal:     Cervical back: Normal range of motion and neck supple.     Right lower leg: No edema.     Left lower leg: No edema.  Lymphadenopathy:     Cervical: No  cervical adenopathy.  Skin:    General: Skin is warm and dry.  Neurological:     Mental Status: She is alert and oriented to person, place, and time. Mental status is at baseline.     Comments: + chronic hoarseness  Psychiatric:        Mood and Affect: Mood normal.        Behavior: Behavior normal.        Thought Content: Thought content normal.           Assessment & Plan:  Weight loss- new.  Pt is down 9 lbs since last visit by changing her diet.  She is walking regularly but this is not new.  Applauded her efforts.  Memory changes- new.  Discussed that misplacing keys and glasses is very normal and not concerning.  She denies difficulty remembering people or places that are close/familiar to her.  No issue understanding functionality of items.  Able to follow directions.  Talked about the need to be present in the moment b/c if someone is talking to her, but she's not fully engaged, she won't remember what is said.  Not b/c she forgot, but bc she never retained it in the first place.  Pt expressed understanding and is  in agreement w/ plan.

## 2022-09-20 NOTE — Patient Instructions (Signed)
Schedule your complete physical in 6 months We'll notify you of your lab results and make any changes if needed Keep up the good work on healthy diet and regular exercise- you look great! We'll call you regarding your Speech Therapy referral Try and be more present during conversations and see if this helps your memory.  Often we don't forget, we never captured it in the first place! Call with any questions or concerns Happy [birth year] Birthday!!

## 2022-09-29 DIAGNOSIS — M51369 Other intervertebral disc degeneration, lumbar region without mention of lumbar back pain or lower extremity pain: Secondary | ICD-10-CM | POA: Insufficient documentation

## 2022-09-29 DIAGNOSIS — M5136 Other intervertebral disc degeneration, lumbar region: Secondary | ICD-10-CM | POA: Diagnosis not present

## 2022-09-29 DIAGNOSIS — M5459 Other low back pain: Secondary | ICD-10-CM | POA: Diagnosis not present

## 2022-10-14 DIAGNOSIS — M5459 Other low back pain: Secondary | ICD-10-CM | POA: Diagnosis not present

## 2022-10-18 DIAGNOSIS — H02834 Dermatochalasis of left upper eyelid: Secondary | ICD-10-CM | POA: Diagnosis not present

## 2022-10-18 DIAGNOSIS — H02831 Dermatochalasis of right upper eyelid: Secondary | ICD-10-CM | POA: Diagnosis not present

## 2022-10-18 DIAGNOSIS — H16223 Keratoconjunctivitis sicca, not specified as Sjogren's, bilateral: Secondary | ICD-10-CM | POA: Diagnosis not present

## 2022-10-20 DIAGNOSIS — M48062 Spinal stenosis, lumbar region with neurogenic claudication: Secondary | ICD-10-CM | POA: Diagnosis not present

## 2022-11-03 DIAGNOSIS — L820 Inflamed seborrheic keratosis: Secondary | ICD-10-CM | POA: Diagnosis not present

## 2022-11-03 DIAGNOSIS — L720 Epidermal cyst: Secondary | ICD-10-CM | POA: Diagnosis not present

## 2022-11-03 DIAGNOSIS — L218 Other seborrheic dermatitis: Secondary | ICD-10-CM | POA: Diagnosis not present

## 2022-11-03 DIAGNOSIS — I781 Nevus, non-neoplastic: Secondary | ICD-10-CM | POA: Diagnosis not present

## 2022-11-05 ENCOUNTER — Telehealth: Payer: Self-pay | Admitting: Family Medicine

## 2022-11-05 DIAGNOSIS — M5451 Vertebrogenic low back pain: Secondary | ICD-10-CM | POA: Diagnosis not present

## 2022-11-05 NOTE — Telephone Encounter (Signed)
Forms placed in Dr Beverely Low to be singed folder

## 2022-11-05 NOTE — Telephone Encounter (Signed)
 Received forms from [facility] Print[employer] & placed in provider bin

## 2022-11-09 NOTE — Telephone Encounter (Signed)
Form completed and returned to [facility] 

## 2022-11-09 NOTE — Telephone Encounter (Signed)
From faxed and placed in scan

## 2022-11-10 LAB — HM MAMMOGRAPHY

## 2022-11-11 ENCOUNTER — Telehealth: Payer: Self-pay | Admitting: *Deleted

## 2022-11-11 NOTE — Telephone Encounter (Signed)
   Name: Chelsea Morgan  DOB: Mar 22, 1950  MRN: [id number]  Primary Cardiologist: None   Preoperative team, please contact this patient and set up a phone call appointment for further preoperative risk assessment. Please obtain consent and complete medication review. Thank you for your help.  I confirm that guidance regarding antiplatelet and oral anticoagulation therapy has been completed and, if necessary, noted below.   Perlie Gold, PA-C 11/11/2022, 9:16 AM [facility]

## 2022-11-11 NOTE — Telephone Encounter (Signed)
   Pre-operative Risk Assessment    Patient Name: Chelsea Morgan  DOB: [birth year] MRN: [id number]    DATE OF LAST VISIT: 03/30/22 Otilio Saber, PAC DATE OF NEXT VISIT: NONE  Request for Surgical Clearance    Procedure:   LUMBAR DECOMPRESSION  Date of Surgery:  Clearance TBD                                 Surgeon:  DR. Jene Every Surgeon's Group or Practice Name:  [facility] Phone number:  [phone] Fax number:  [phone] ATTN: Rosalva Ferron   Type of Clearance Requested:   - Medical ; NO MEDICATIONS LISTED AS NEEDING TO BE HELD   Type of Anesthesia:  Not Indicated; GENERAL ?    Additional requests/questions:    Elpidio Anis   11/11/2022, 8:50 AM

## 2022-11-12 ENCOUNTER — Telehealth: Payer: Self-pay

## 2022-11-12 NOTE — Telephone Encounter (Signed)
  Patient Consent for Virtual Visit         Chelsea Morgan has provided verbal consent on 11/12/2022 for a virtual visit (video or telephone).   CONSENT FOR VIRTUAL VISIT FOR:  Chelsea Morgan  By participating in this virtual visit I agree to the following:  I hereby voluntarily request, consent and authorize [facility] and its employed or contracted physicians, physician assistants, nurse practitioners or other licensed health care professionals (the Practitioner), to provide me with telemedicine health care services (the "Services") as deemed necessary by the treating Practitioner. I acknowledge and consent to receive the Services by the Practitioner via telemedicine. I understand that the telemedicine visit will involve communicating with the Practitioner through live audiovisual communication technology and the disclosure of certain medical information by electronic transmission. I acknowledge that I have been given the opportunity to request an in-person assessment or other available alternative prior to the telemedicine visit and am voluntarily participating in the telemedicine visit.  I understand that I have the right to withhold or withdraw my consent to the use of telemedicine in the course of my care at any time, without affecting my right to future care or treatment, and that the Practitioner or I may terminate the telemedicine visit at any time. I understand that I have the right to inspect all information obtained and/or recorded in the course of the telemedicine visit and may receive copies of available information for a reasonable fee.  I understand that some of the potential risks of receiving the Services via telemedicine include:  Delay or interruption in medical evaluation due to technological equipment failure or disruption; Information transmitted may not be sufficient (e.g. poor resolution of images) to allow for appropriate medical decision making by the Practitioner;  and/or  In rare instances, security protocols could fail, causing a breach of personal health information.  Furthermore, I acknowledge that it is my responsibility to provide information about my medical history, conditions and care that is complete and accurate to the best of my ability. I acknowledge that Practitioner's advice, recommendations, and/or decision may be based on factors not within their control, such as incomplete or inaccurate data provided by me or distortions of diagnostic images or specimens that may result from electronic transmissions. I understand that the practice of medicine is not an exact science and that Practitioner makes no warranties or guarantees regarding treatment outcomes. I acknowledge that a copy of this consent can be made available to me via my patient portal [facility] MyChart), or I can request a printed copy by calling the office of [facility].    I understand that my insurance will be billed for this visit.   I have read or had this consent read to me. I understand the contents of this consent, which adequately explains the benefits and risks of the Services being provided via telemedicine.  I have been provided ample opportunity to ask questions regarding this consent and the Services and have had my questions answered to my satisfaction. I give my informed consent for the services to be provided through the use of telemedicine in my medical care

## 2022-11-30 ENCOUNTER — Ambulatory Visit: Payer: Medicare HMO | Attending: Cardiovascular Disease | Admitting: Student

## 2022-11-30 DIAGNOSIS — Z1231 Encounter for screening mammogram for malignant neoplasm of breast: Secondary | ICD-10-CM | POA: Diagnosis not present

## 2022-11-30 DIAGNOSIS — Z01810 Encounter for preprocedural cardiovascular examination: Secondary | ICD-10-CM | POA: Diagnosis not present

## 2022-11-30 LAB — HM MAMMOGRAPHY

## 2022-11-30 NOTE — Progress Notes (Signed)
Virtual Visit via Telephone Note   Because of Chelsea Morgan's co-morbid illnesses, she is at least at moderate risk for complications without adequate follow up.  This format is felt to be most appropriate for this patient at this time.  The patient did not have access to video technology/had technical difficulties with video requiring transitioning to audio format only (telephone).  All issues noted in this document were discussed and addressed.  No physical exam could be performed with this format.  Please refer to the patient's chart for her consent to telehealth for [facility].  Evaluation Performed:  Preoperative cardiovascular risk assessment _____________   Date:  11/30/2022   Patient ID:  Chelsea Morgan, DOB 1950-07-02, MRN [id number] Patient Location:  Home Provider location:   Office  Primary Care Provider:  Sheliah Hatch, MD Primary Cardiologist:  None  Chief Complaint / Patient Profile   72 y.o. y/o female with a h/o permanent afib, aflutter s/p ablation, hypertension, hyperlipidemia, GERD who is pending lumbar decompression by Dr. Shelle Iron and presents today for telephonic preoperative cardiovascular risk assessment.  History of Present Illness    Chelsea Morgan is a 72 y.o. female who presents via audio/video conferencing for a telehealth visit today.  Pt was last seen in cardiology clinic on 03/30/2022 by Otilio Saber, PA-C.  At that time DARRIEL SCHALL was stable from a cardiac standpoint.  The patient is now pending procedure as outlined above. Since her last visit, she is doing well. Patient denies shortness of breath, dyspnea on exertion, lower extremity edema, orthopnea or PND. No chest pain, pressure, or tightness. No palpitations. She stays very active getting 10,000 steps most days and performing light to moderate household activities.   Past Medical History    Past Medical History:  Diagnosis Date   ALLERGIC RHINITIS 01/12/2008   Atrial flutter (HCC)     Blood transfusion without reported diagnosis 1981   after ectopic pregnancy   CAROTID BRUITS, BILATERAL 04/01/2009   Essential hypertension, benign 01/12/2008   HYPERLIPIDEMIA 05/02/2007   Rheumatic fever    Past Surgical History:  Procedure Laterality Date   ABLATION  12/28/2013   atrial flutter ablation by Dr Ladona Ridgel   ATRIAL FLUTTER ABLATION N/A 12/28/2013   Procedure: ATRIAL FLUTTER ABLATION;  Surgeon: Marinus Maw, MD;  Location: [facility] CATH LAB;  Service: Cardiovascular;  Laterality: N/A;   CARPAL TUNNEL RELEASE Right 03/08/2005   CATARACT EXTRACTION Bilateral    SALPINGECTOMY  03/09/1979    Allergies  Allergies  Allergen Reactions   Sulfa Antibiotics Swelling    Hospitalized x 3 days 20001    Home Medications    Prior to Admission medications   Medication Sig Start Date End Date Taking? Authorizing Provider  atorvastatin (LIPITOR) 10 MG tablet Take 1 tablet (10 mg total) by mouth daily. 03/10/22   Sheliah Hatch, MD  b complex vitamins capsule Take 1 capsule by mouth daily.    [provider]  BIOTIN 5000 PO Take 1 tablet by mouth daily.     [provider]  carvedilol (COREG) 12.5 MG tablet Take 1/2 (one-half) tablet by mouth twice daily 03/10/22   Sheliah Hatch, MD  EPINEPHrine (EPI-PEN) 0.3 mg/0.3 mL DEVI Inject 0.3 mg into the muscle once.    [provider]  finasteride (PROSCAR) 5 MG tablet Take 1/2 (one-half) tablet by mouth once daily 03/10/22   Sheliah Hatch, MD  hydrochlorothiazide (HYDRODIURIL) 25 MG tablet Take 1 tablet (  25 mg total) by mouth daily. 03/10/22   Sheliah Hatch, MD  ibuprofen (ADVIL) 800 MG tablet Take 1 tablet (800 mg total) by mouth every 8 (eight) hours as needed. 03/10/22   Sheliah Hatch, MD  Magnesium 250 MG TABS Take 1 tablet by mouth 2 (two) times daily.    [provider]  Omega-3 Fatty Acids (FISH OIL) 1360 MG CAPS Take by mouth daily.    [provider]  POTASSIUM PO Take 1  capsule by mouth daily. OTC    [provider]    Physical Exam    Vital Signs:  NORMALINDA NOBLITT does not have vital signs available for review today.  Given telephonic nature of communication, physical exam is limited. AAOx3. NAD. Normal affect.  Speech and respirations are unlabored.  Accessory Clinical Findings    None  Assessment & Plan    Primary Cardiologist: None  Preoperative cardiovascular risk assessment. Lumbar decompression by Dr. Shelle Iron.   Chart reviewed as part of pre-operative protocol coverage. According to the RCRI, patient has a 0.4% risk of MACE. Patient reports activity equivalent to >4.0 METS (gets 10,000 steps most days, does light to moderate household activities).   Given past medical history and time since last visit, based on ACC/AHA guidelines, Winny Piech Peschel would be at acceptable risk for the planned procedure without further cardiovascular testing.   Patient was advised that if she develops new symptoms prior to surgery to contact our office to arrange a follow-up appointment.  she verbalized understanding.   I will route this recommendation to the requesting party via Epic fax function.  Please call with questions.  Time:   Today, I have spent 5 minutes with the patient with telehealth technology discussing medical history, symptoms, and management plan.     Carlos Levering, NP  11/30/2022, 8:55 AM

## 2022-12-16 DIAGNOSIS — J383 Other diseases of vocal cords: Secondary | ICD-10-CM | POA: Diagnosis not present

## 2022-12-16 DIAGNOSIS — E8581 Light chain (AL) amyloidosis: Secondary | ICD-10-CM | POA: Diagnosis not present

## 2022-12-16 DIAGNOSIS — E859 Amyloidosis, unspecified: Secondary | ICD-10-CM | POA: Diagnosis not present

## 2022-12-16 DIAGNOSIS — R490 Dysphonia: Secondary | ICD-10-CM | POA: Diagnosis not present

## 2022-12-16 DIAGNOSIS — J3801 Paralysis of vocal cords and larynx, unilateral: Secondary | ICD-10-CM | POA: Diagnosis not present

## 2022-12-17 ENCOUNTER — Telehealth: Payer: Self-pay | Admitting: Student

## 2022-12-17 NOTE — Telephone Encounter (Signed)
I have reviewed clearance notes by Carlos Levering, NP. I will re-fax notes to the surgeon office .

## 2022-12-17 NOTE — Telephone Encounter (Signed)
Patient is calling and saying that the doctor's office have not received the fax yet from [facility]. Please call back

## 2022-12-20 ENCOUNTER — Ambulatory Visit: Payer: Self-pay | Admitting: Orthopedic Surgery

## 2022-12-20 DIAGNOSIS — M48062 Spinal stenosis, lumbar region with neurogenic claudication: Secondary | ICD-10-CM

## 2023-01-10 DIAGNOSIS — E859 Amyloidosis, unspecified: Secondary | ICD-10-CM | POA: Diagnosis not present

## 2023-01-10 DIAGNOSIS — E8581 Light chain (AL) amyloidosis: Secondary | ICD-10-CM | POA: Diagnosis not present

## 2023-01-10 DIAGNOSIS — J383 Other diseases of vocal cords: Secondary | ICD-10-CM | POA: Diagnosis not present

## 2023-01-10 DIAGNOSIS — J3800 Paralysis of vocal cords and larynx, unspecified: Secondary | ICD-10-CM | POA: Insufficient documentation

## 2023-01-10 DIAGNOSIS — R490 Dysphonia: Secondary | ICD-10-CM | POA: Diagnosis not present

## 2023-01-10 DIAGNOSIS — J3801 Paralysis of vocal cords and larynx, unilateral: Secondary | ICD-10-CM | POA: Diagnosis not present

## 2023-01-11 DIAGNOSIS — M5451 Vertebrogenic low back pain: Secondary | ICD-10-CM | POA: Diagnosis not present

## 2023-01-13 NOTE — Progress Notes (Signed)
Surgical Instructions   Your procedure is scheduled on November 14. Report to [facility] Main Entrance "A" at 5:30 A.M., then check in with the Admitting office. Any questions or running late day of surgery: call [phone]  Questions prior to your surgery date: call [phone], Monday-Friday, 8am-4pm. If you experience any cold or flu symptoms such as cough, fever, chills, shortness of breath, etc. between now and your scheduled surgery, please notify us at the above number.     Remember:  Do not eat after midnight the night before your surgery  You may drink clear liquids until 4:30am the morning of your surgery.   Clear liquids allowed are: Water, Non-Citrus Juices (without pulp), Carbonated Beverages, Clear Tea (no milk, honey, etc.), Black Coffee Only (NO MILK, CREAM OR POWDERED CREAMER of any kind), and Gatorade.   The night before surgery:  No food after midnight. ONLY clear liquids after midnight  The day of surgery (if you do NOT have diabetes):  Drink ONE (1) Pre-Surgery Clear Ensure by 4:30am the morning of surgery. Drink in one sitting. Do not sip.  This drink was given to you during your hospital  pre-op appointment visit.  Nothing else to drink after completing the  Pre-Surgery Clear Ensure.         If you have questions, please contact your surgeon's office.    Take these medicines the morning of surgery with A SIP OF WATER  atorvastatin (LIPITOR)  carvedilol (COREG)  finasteride (PROSCAR)   May take these medicines IF NEEDED:EPINEPHrine (EPI-PEN)   One week prior to surgery, STOP taking any Aspirin (unless otherwise instructed by your surgeon) Aleve, Naproxen, Ibuprofen, Motrin, Advil, Goody's, BC's, all herbal medications, fish oil, and non-prescription vitamins.                     Do NOT Smoke (Tobacco/Vaping) for 24 hours prior to your procedure.  If you use a CPAP at night, you may bring your mask/headgear for your overnight stay.   You will be  asked to remove any contacts, glasses, piercing's, hearing aid's, dentures/partials prior to surgery. Please bring cases for these items if needed.    Patients discharged the day of surgery will not be allowed to drive home, and someone needs to stay with them for 24 hours.  SURGICAL WAITING ROOM VISITATION Patients may have no more than 2 support people in the waiting area - these visitors may rotate.   Pre-op nurse will coordinate an appropriate time for 1 ADULT support person, who may not rotate, to accompany patient in pre-op.  Children under the age of 72 must have an adult with them who is not the patient and must remain in the main waiting area with an adult.  If the patient needs to stay at the hospital during part of their recovery, the visitor guidelines for inpatient rooms apply.  Please refer to the [facility] website for the visitor guidelines for any additional information.   If you received a COVID test during your pre-op visit  it is requested that you wear a mask when out in public, stay away from anyone that may not be feeling well and notify your surgeon if you develop symptoms. If you have been in contact with anyone that has tested positive in the last 10 days please notify you surgeon.      Pre-operative 5 CHG Bathing Instructions   You can play a key role in reducing the risk of infection after surgery.  Your skin needs to be as free of germs as possible. You can reduce the number of germs on your skin by washing with CHG (chlorhexidine gluconate) soap before surgery. CHG is an antiseptic soap that kills germs and continues to kill germs even after washing.   DO NOT use if you have an allergy to chlorhexidine/CHG or antibacterial soaps. If your skin becomes reddened or irritated, stop using the CHG and notify one of our RNs at [phone].   Please shower with the CHG soap starting 4 days before surgery using the following schedule:     Please keep in mind the  following:  DO NOT shave, including legs and underarms, starting the day of your first shower.   You may shave your face at any point before/day of surgery.  Place clean sheets on your bed the day you start using CHG soap. Use a clean washcloth (not used since being washed) for each shower. DO NOT sleep with pets once you start using the CHG.   CHG Shower Instructions:  Wash your face and private area with normal soap. If you choose to wash your hair, wash first with your normal shampoo.  After you use shampoo/soap, rinse your hair and body thoroughly to remove shampoo/soap residue.  Turn the water OFF and apply about 3 tablespoons (45 ml) of CHG soap to a CLEAN washcloth.  Apply CHG soap ONLY FROM YOUR NECK DOWN TO YOUR TOES (washing for 3-5 minutes)  DO NOT use CHG soap on face, private areas, open wounds, or sores.  Pay special attention to the area where your surgery is being performed.  If you are having back surgery, having someone wash your back for you may be helpful. Wait 2 minutes after CHG soap is applied, then you may rinse off the CHG soap.  Pat dry with a clean towel  Put on clean clothes/pajamas   If you choose to wear lotion, please use ONLY the CHG-compatible lotions on the back of this paper.   Additional instructions for the day of surgery: DO NOT APPLY any lotions, deodorants, cologne, or perfumes.   Do not bring valuables to the hospital. [facility] is not responsible for any belongings/valuables. Do not wear nail polish, gel polish, artificial nails, or any other type of covering on natural nails (fingers and toes) Do not wear jewelry or makeup Put on clean/comfortable clothes.  Please brush your teeth.  Ask your nurse before applying any prescription medications to the skin.     CHG Compatible Lotions   Aveeno Moisturizing lotion  Cetaphil Moisturizing Cream  Cetaphil Moisturizing Lotion  Clairol Herbal Essence Moisturizing Lotion, Dry Skin  Clairol  Herbal Essence Moisturizing Lotion, Extra Dry Skin  Clairol Herbal Essence Moisturizing Lotion, Normal Skin  Curel Age Defying Therapeutic Moisturizing Lotion with Alpha Hydroxy  Curel Extreme Care Body Lotion  Curel Soothing Hands Moisturizing Hand Lotion  Curel Therapeutic Moisturizing Cream, Fragrance-Free  Curel Therapeutic Moisturizing Lotion, Fragrance-Free  Curel Therapeutic Moisturizing Lotion, Original Formula  Eucerin Daily Replenishing Lotion  Eucerin Dry Skin Therapy Plus Alpha Hydroxy Crme  Eucerin Dry Skin Therapy Plus Alpha Hydroxy Lotion  Eucerin Original Crme  Eucerin Original Lotion  Eucerin Plus Crme Eucerin Plus Lotion  Eucerin TriLipid Replenishing Lotion  Keri Anti-Bacterial Hand Lotion  Keri Deep Conditioning Original Lotion Dry Skin Formula Softly Scented  Keri Deep Conditioning Original Lotion, Fragrance Free Sensitive Skin Formula  Keri Lotion Fast Absorbing Fragrance Free Sensitive Skin Formula  Keri Lotion Fast Absorbing Softly  Scented Dry Skin Formula  Keri Original Lotion  Keri Skin Renewal Lotion Keri Silky Smooth Lotion  Keri Silky Smooth Sensitive Skin Lotion  Nivea Body Creamy Conditioning Oil  Nivea Body Extra Enriched Lotion  Nivea Body Original Lotion  Nivea Body Sheer Moisturizing Lotion Nivea Crme  Nivea Skin Firming Lotion  NutraDerm 30 Skin Lotion  NutraDerm Skin Lotion  NutraDerm Therapeutic Skin Cream  NutraDerm Therapeutic Skin Lotion  ProShield Protective Hand Cream  Provon moisturizing lotion  Please read over the following fact sheets that you were given.

## 2023-01-14 ENCOUNTER — Encounter (HOSPITAL_COMMUNITY)
Admission: RE | Admit: 2023-01-14 | Discharge: 2023-01-14 | Disposition: A | Discharge status: Home / Self Care | Payer: Medicare HMO | Source: Ambulatory Visit | Attending: Specialist | Admitting: Specialist

## 2023-01-14 ENCOUNTER — Encounter (HOSPITAL_COMMUNITY): Payer: Self-pay

## 2023-01-14 ENCOUNTER — Ambulatory Visit (HOSPITAL_COMMUNITY)
Admission: RE | Admit: 2023-01-14 | Discharge: 2023-01-14 | Disposition: A | Discharge status: Home / Self Care | Payer: Medicare HMO | Source: Ambulatory Visit | Attending: Orthopedic Surgery | Admitting: Orthopedic Surgery

## 2023-01-14 ENCOUNTER — Other Ambulatory Visit: Payer: Self-pay

## 2023-01-14 VITALS — BP 133/72 | HR 96 | Temp 97.7°F | Resp 16 | Ht 62.0 in | Wt 128.8 lb

## 2023-01-14 DIAGNOSIS — M419 Scoliosis, unspecified: Secondary | ICD-10-CM | POA: Diagnosis not present

## 2023-01-14 DIAGNOSIS — Z01812 Encounter for preprocedural laboratory examination: Secondary | ICD-10-CM | POA: Diagnosis present

## 2023-01-14 DIAGNOSIS — M48062 Spinal stenosis, lumbar region with neurogenic claudication: Secondary | ICD-10-CM | POA: Insufficient documentation

## 2023-01-14 DIAGNOSIS — Z01818 Encounter for other preprocedural examination: Secondary | ICD-10-CM | POA: Diagnosis present

## 2023-01-14 DIAGNOSIS — M51369 Other intervertebral disc degeneration, lumbar region without mention of lumbar back pain or lower extremity pain: Secondary | ICD-10-CM | POA: Insufficient documentation

## 2023-01-14 DIAGNOSIS — M5126 Other intervertebral disc displacement, lumbar region: Secondary | ICD-10-CM | POA: Diagnosis not present

## 2023-01-14 DIAGNOSIS — M4316 Spondylolisthesis, lumbar region: Secondary | ICD-10-CM | POA: Diagnosis not present

## 2023-01-14 DIAGNOSIS — I10 Essential (primary) hypertension: Secondary | ICD-10-CM | POA: Diagnosis not present

## 2023-01-14 DIAGNOSIS — M438X5 Other specified deforming dorsopathies, thoracolumbar region: Secondary | ICD-10-CM | POA: Diagnosis not present

## 2023-01-14 DIAGNOSIS — M48061 Spinal stenosis, lumbar region without neurogenic claudication: Secondary | ICD-10-CM | POA: Diagnosis not present

## 2023-01-14 HISTORY — DX: Cardiac arrhythmia, unspecified: I49.9

## 2023-01-14 HISTORY — DX: Organ-limited amyloidosis: E85.4

## 2023-01-14 HISTORY — DX: Unspecified osteoarthritis, unspecified site: M19.90

## 2023-01-14 LAB — CBC
HCT: 42.2 % (ref 36.0–46.0)
Hemoglobin: 14.1 g/dL (ref 12.0–15.0)
MCH: 30.1 pg (ref 26.0–34.0)
MCHC: 33.4 g/dL (ref 30.0–36.0)
MCV: 90 fL (ref 80.0–100.0)
Platelets: 233 10*3/uL (ref 150–400)
RBC: 4.69 MIL/uL (ref 3.87–5.11)
RDW: 12.7 % (ref 11.5–15.5)
WBC: 5.6 10*3/uL (ref 4.0–10.5)
nRBC: 0 % (ref 0.0–0.2)

## 2023-01-14 LAB — BASIC METABOLIC PANEL
Anion gap: 10 (ref 5–15)
BUN: 12 mg/dL (ref 8–23)
CO2: 27 mmol/L (ref 22–32)
Calcium: 9.5 mg/dL (ref 8.9–10.3)
Chloride: 99 mmol/L (ref 98–111)
Creatinine, Ser: 0.67 mg/dL (ref 0.44–1.00)
GFR, Estimated: >60 mL/min (ref >60)
Glucose, Bld: 106 mg/dL — ABNORMAL HIGH (ref 70–99)
Potassium: 3.9 mmol/L (ref 3.5–5.1)
Sodium: 136 mmol/L (ref 135–145)

## 2023-01-14 LAB — SURGICAL PCR SCREEN
MRSA, PCR: NEGATIVE
Staphylococcus aureus: NEGATIVE

## 2023-01-14 LAB — TYPE AND SCREEN
ABO/RH(D): O POS
Antibody Screen: NEGATIVE

## 2023-01-14 NOTE — Progress Notes (Signed)
PCP - Neena Rhymes, MD Cardiologist - Lewayne Bunting, MD  PPM/ICD - denies Device Orders - n/a Rep Notified - n/a  Chest x-ray - denies;  EKG - 03/30/2022 Stress Test - 11/13/2013 ECHO - 10/29/2019 Cardiac Cath - denies  Sleep Study - denies CPAP - n/a  Fasting Blood Sugar - no DM Checks Blood Sugar _____ times a day  Last dose of GLP1 agonist-  n/a GLP1 instructions: n/a  Blood Thinner Instructions: n/a Aspirin Instructions: follow surgeon's instructions on when to stop Aspirin. Per pt her cardiologist advised to stop taking Aspirin 7 days prior to procedure.   ERAS Protcol -yes PRE-SURGERY Ensure or G2- Ensure  COVID TEST- n/a   Anesthesia review: yes; cardiac clearance  Patient denies shortness of breath, fever, cough and chest pain at PAT appointment   All instructions explained to the patient, with a verbal understanding of the material. Patient agrees to go over the instructions while at home for a better understanding. Patient also instructed to self quarantine after being tested for COVID-19. The opportunity to ask questions was provided.

## 2023-01-17 ENCOUNTER — Encounter (HOSPITAL_COMMUNITY): Payer: Self-pay

## 2023-01-17 NOTE — Progress Notes (Signed)
Anesthesia Chart Review:  Case: [id number] Date/Time: 01/20/23 0715   Procedure: Central decompression L4-5 with lateral mass fusion using autologous and allograft bone - 3 C-Bed   Anesthesia type: General   Pre-op diagnosis: Stenosis L4-5   [facility]: [facility] ROOM 18 / [facility]   Surgeons: Jene Every, MD       DISCUSSION: Patient is a 72 year old female scheduled for the above procedure.  History includes never smoker, HTN, HLD, rheumatic fever, atrial fib/flutter (s/p ablation 12/28/13), carotid bruits, amyloidosis of larynx (s/p Coblator excision of false vocal cord lesion 07/12/19, chronic hoarseness).  She is followed by EP Dr. Ladona Ridgel for permanent afib. S/p ablation 12/28/13. She is not on [facility] despite CHA2DS2/VASc of at least 3. She may be interested in Watchman device in the future, otherwise 12 month follow-up recommended at 03/30/22 office visit.   Preoperative cardiology input outlined by Carlos Levering, NP on 11/30/22, "Chart reviewed as part of pre-operative protocol coverage. According to the RCRI, patient has a 0.4% risk of MACE. Patient reports activity equivalent to >4.0 METS (gets 10,000 steps most days, does light to moderate household activities).   Given past medical history and time since last visit, based on ACC/AHA guidelines, Krisa Longhi Luma would be at acceptable risk for the planned procedure without further cardiovascular testing..."  She reported instructions to hold aspirin 7 days prior to surgery.  Anesthesia team to evaluate on the day of surgery.   VS: BP 133/72   Pulse 96   Temp 36.5 C   Resp 16   Ht 5\' 2"  (1.575 m)   Wt 58.4 kg   SpO2 100%   BMI 23.56 kg/m    PROVIDERS: Sheliah Hatch, MD is PCP  - Lewayne Bunting, MD is EP cardiologist - Marzella Schlein, MD is HEM-ONC. 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 This type of immunoglobulin-associated amyloidosis is generally localized to the larynx and tracheobronchial tree and is not typically associated with systemic amyloidosis.'"  Harriette Ohara, MD is ENT   LABS: Labs reviewed: Acceptable for surgery. (all labs ordered are listed, but only abnormal results are displayed)  Labs Reviewed  BASIC METABOLIC PANEL - Abnormal; Notable for the following components:      Result Value   Glucose, Bld 106 (*)    All other components within normal limits  SURGICAL PCR SCREEN  CBC  TYPE AND SCREEN     IMAGES: CT Neck/Thyroid 04/02/19 ([facility]): - Irregular contour and fullness of the left false vocal fold with asymmetric soft tissue density between the left false vocal fold and the adjacent left thyroid cartilage. These findings could reflect a submucosal mass, and ENT evaluation is recommended.  - No lymphadenopathy identified within the neck.  - Additional findings: Calcified and noncalcified atherosclerotic plaque along the bilateral carotid artery bifurcations. Intracranial atherosclerosis.  - S/p Coblator excision of false vocal fold lesion on 07/12/19. Pathology AL amyloid, Kappa c/w tracheobronchial amyloidosis.   EKG: 03/30/22: Atrial fibrillation at 87 bpm   CV: Echo 10/29/19 ([facility]): SUMMARY  The left ventricular size is normal.  There is normal left ventricular wall thickness.  Left ventricular systolic function is low normal.  LV ejection fraction = 50-55%.  The left ventricular wall  motion is normal.  LV Global L Strain =-13.0%. Global longitudinal strain is reduced, but  there is not clear apical sparing to which is typical for cardiac  amyloidosis.  Left ventricular filling pattern is indeterminate.  The left atrium is mildly dilated.  The right ventricle is  mildly dilated.  The right ventricular systolic function is normal.  There is no significant valvular stenosis or regurgitation.  IVC size was normal.  There is no pericardial effusion.  There is no comparison study available.  - Recommend echo follow-up with strain imaging.    Nuclear stress test 11/13/13: Overall Impression:  Low risk stress nuclear study with no ischemia identified. LV Ejection Fraction: 65%.  LV Wall Motion:  NL LV Function; NL Wall Motion   Past Medical History:  Diagnosis Date   ALLERGIC RHINITIS 01/12/2008   Amyloidosis of larynx (HCC)    s/p Coblator excision of false vocal fold lesion 07/12/19, pathology "AL amyloid, Kappa" most compatible tracheobronchial amyloidosis   Arthritis    back   Atrial flutter (HCC)    Blood transfusion without reported diagnosis 03/09/1979   after ectopic pregnancy   CAROTID BRUITS, BILATERAL 04/01/2009   Dysrhythmia    Essential hypertension, benign 01/12/2008   HYPERLIPIDEMIA 05/02/2007   Rheumatic fever     Past Surgical History:  Procedure Laterality Date   ABLATION  12/28/2013   atrial flutter ablation by Dr Ladona Ridgel   ATRIAL FLUTTER ABLATION N/A 12/28/2013   Procedure: ATRIAL FLUTTER ABLATION;  Surgeon: Marinus Maw, MD;  Location[facility] C[facility] LAB;  Service: Cardiovascular;  Laterality: N/A;   CARPAL TUNNEL RELEASE Right 03/08/2005   CATARACT EXTRACTION Bilateral 2022   EYE SURGERY     SALPINGECTOMY  03/09/1979    MEDICATIONS:  aspirin EC 81 MG tablet   atorvastatin (LIPITOR) 10 MG tablet   b complex vitamins capsule   BIOTIN 5000 PO   carvedilol (COREG) 12.5 MG tablet   Coenzyme Q10 (COQ10) 100 MG CAPS   EPINEPHrine (EPI-PEN) 0.3 mg/0.3 mL DEVI   finasteride (PROSCAR) 5 MG tablet   hydrochlorothiazide (HYDRODIURIL) 25 MG tablet   hydrocortisone 2.5 % ointment   levocetirizine (XYZAL) 5 MG tablet   Magnesium 250 MG TABS   Omega-3 Fatty Acids (FISH OIL) 1360 MG CAPS   POTASSIUM PO   No current  facility-administered medications for this encounter.    Shonna Chock, PA-C Surgical Short Stay/Anesthesiology [facility] Phone [phone] [facility] Phone [phone] 01/17/2023 2:06 PM

## 2023-01-17 NOTE — Anesthesia Preprocedure Evaluation (Signed)
Anesthesia Evaluation  Patient identified by MRN, date of birth, ID band Patient awake    Reviewed: Allergy & Precautions, H&P , NPO status , Patient's Chart, lab work & pertinent test results  Airway Mallampati: II   Neck ROM: full    Dental   Pulmonary neg pulmonary ROS   breath sounds clear to auscultation       Cardiovascular hypertension, + dysrhythmias Atrial Fibrillation  Rhythm:regular Rate:Normal     Neuro/Psych    GI/Hepatic ,GERD  ,,  Endo/Other    Renal/GU      Musculoskeletal  (+) Arthritis ,    Abdominal   Peds  Hematology   Anesthesia Other Findings   Reproductive/Obstetrics                             Anesthesia Physical Anesthesia Plan  ASA: 3  Anesthesia Plan: General   Post-op Pain Management:    Induction: Intravenous  PONV Risk Score and Plan: 3 and Ondansetron, Dexamethasone, Midazolam and Treatment may vary due to age or medical condition  Airway Management Planned: Oral ETT and Video Laryngoscope Planned  Additional Equipment:   Intra-op Plan:   Post-operative Plan: Extubation in OR  Informed Consent: I have reviewed the patients History and Physical, chart, labs and discussed the procedure including the risks, benefits and alternatives for the proposed anesthesia with the patient or authorized representative who has indicated his/her understanding and acceptance.     Dental advisory given  Plan Discussed with: CRNA, Anesthesiologist and Surgeon  Anesthesia Plan Comments: (PAT note written 01/17/2023 by Shonna Chock, PA-C.  )       Anesthesia Quick Evaluation

## 2023-01-19 ENCOUNTER — Ambulatory Visit: Payer: Self-pay | Admitting: Orthopedic Surgery

## 2023-01-19 NOTE — H&P (View-Only) (Signed)
Chelsea Morgan is an 72 y.o. female.   Chief Complaint: back and leg pain HPI: Reason for Visit: (normal) visit for: (back); Years Location (Lower Extremity): lower back pain ; leg pain bilateral, right side is worse, Severity: pain level 6/10 Quality: burning; stabbing Aggravating Factors: standing for ; Worse when first standing Associated Symptoms: numbness/tingling (feet) Medications: helping a little (Ibuprofen); Tylenol and Ibuprofen Notes: Insurance denial- decompression L4-5 with lateral mass fusion Was brought in as I called her and she is having numbness into her feet now. Mainly on the right side she is reporting some weakness in her legs when she walks. No change in bowel or bladder function fevers or chills. Weakness into the right leg in particular.  Past Medical History:  Diagnosis Date   ALLERGIC RHINITIS 01/12/2008   Amyloidosis of larynx (HCC)    s/p Coblator excision of false vocal fold lesion 07/12/19, pathology "AL amyloid, Kappa" most compatible tracheobronchial amyloidosis   Arthritis    back   Atrial flutter (HCC)    Blood transfusion without reported diagnosis 03/09/1979   after ectopic pregnancy   CAROTID BRUITS, BILATERAL 04/01/2009   Dysrhythmia    Essential hypertension, benign 01/12/2008   HYPERLIPIDEMIA 05/02/2007   Rheumatic fever     Past Surgical History:  Procedure Laterality Date   ABLATION  12/28/2013   atrial flutter ablation by Dr Ladona Ridgel   ATRIAL FLUTTER ABLATION N/A 12/28/2013   Procedure: ATRIAL FLUTTER ABLATION;  Surgeon: Marinus Maw, MD;  Location: [facility] CATH LAB;  Service: Cardiovascular;  Laterality: N/A;   CARPAL TUNNEL RELEASE Right 03/08/2005   CATARACT EXTRACTION Bilateral 2022   EYE SURGERY     SALPINGECTOMY  03/09/1979    Family History  Problem Relation Age of Onset   Cancer Mother        stomach   Esophageal cancer Mother 39       per pt esophageal ca at [facility]   Stroke Father    Cancer Paternal Aunt        colon    Colon cancer Paternal Aunt 87   Rectal cancer Neg Hx    Stomach cancer Neg Hx    Social History:  reports that she has never smoked. She has never used smokeless tobacco. She reports current alcohol use. She reports that she does not currently use drugs.  Allergies:  Allergies  Allergen Reactions   Bee Venom Swelling   Sulfa Antibiotics Swelling    Hospitalized x 3 days 20001   Current meds: atorvastatin 10 mg tablet atorvastatin 20 mg tablet BinaxNOW COVID-19 Ag Self Test kit carvediloL 12.5 mg tablet finasteride 5 mg tablet hydroCHLOROthiazide 25 mg tablet ibuprofen 800 mg tablet terbinafine HCL 250 mg tablet TylenoL  Review of Systems  Constitutional: Negative.   HENT: Negative.    Eyes: Negative.   Respiratory: Negative.    Cardiovascular: Negative.   Gastrointestinal: Negative.   Endocrine: Negative.   Genitourinary: Negative.   Musculoskeletal:  Positive for back pain and gait problem.  Skin: Negative.   Neurological:  Positive for weakness and numbness.  Psychiatric/Behavioral: Negative.      There were no vitals taken for this visit. Physical Exam Constitutional:      Appearance: Normal appearance.  HENT:     Head: Normocephalic and atraumatic.     Right Ear: External ear normal.     Left Ear: External ear normal.     Nose: Nose normal.     Mouth/Throat:  Pharynx: Oropharynx is clear.  Eyes:     Conjunctiva/sclera: Conjunctivae normal.  Cardiovascular:     Rate and Rhythm: Normal rate and regular rhythm.     Pulses: Normal pulses.     Heart sounds: Normal heart sounds.  Pulmonary:     Effort: Pulmonary effort is normal.     Breath sounds: Normal breath sounds.  Abdominal:     General: Bowel sounds are normal.  Musculoskeletal:     Cervical back: Normal range of motion.     Comments: Straight leg raise on the right is positive on the left is buttock pain. EHL is now 4/5 bilaterally. She has decreased sensation on plantar aspect of the right  foot. She walks with a forward flexed antalgic  Skin:    General: Skin is warm and dry.  Neurological:     Mental Status: She is alert.    X-rays lumbar spine demonstrates a levorotatory scoliosis. Apex at L1. Mild osteoarthritis of the hips. A minimal grade 1 spinal listhesis at L4-5. Calcification of the aorta. Significant osteopenia  MRI lumbar spine from last month demonstrates severe spinal stenosis at L4-5 due to ligamentum flavum and facet hypertrophy. There are no facet effusions noted. There is no instability in flexion-extension.  Assessment/Plan Impression:  1. Persistent radiculopathy and neurogenic claudication secondary to severe spinal stenosis at L4-5 with a minimal spondylolisthesis with underlying scoliosis refractory to conservative treatment. Now with bilateral EHL weakness and diminished sensation to plantar aspect of her foot.  Plan:  Again we have discussed options but given the progressive neurologic deficit I would recommend as previously suggested a lumbar decompression L4-5 and a lateral mass fusion given her underlying osteoporosis.   Specifically discussing spinal stenosis. I also discussed the natural history and the progression of spinal stenosis. That the spinal canal dimensions increase with flexion and sitting while it decreases with extension such as standing straight in one position or lying flat on one's back or stomach.  The concepts of avoiding extension and favoring flexion throughout their activities of daily living to avoid neural compression were discussed. Also utilization of a stepstool while standing or sitting to tilt the pelvis to open up the spinal canal as well as gently leaning forward both with walking and with activity. Use of hiking sticks versus a rolling walker in more severe cases to promote forward flexion while walking. Exercise utilizing a stationary or recumbent bike to allow for more room inside the spinal canal. Avoiding extension  such as direct overhead work as well as Chief of [employer] on a hard surface. Utilizing a wedge underneath the knees while sleeping on their back and a pillow between the knees when sleeping on their sides. And that typically a softer mattress is more accommodating than a hard one.  We discussed injection therapy although I feel that would be temporary given the degree of her stenosis.  We discussed medical management other than Tylenol and ibuprofen she is taken. She is not interested in taking stronger medication.   We discussed the decompression at L4-5 and a likely lateral mass fusion given her underlying osteoporosis I had an extensive discussion with the patient concerning the pathology relevant anatomy and treatment options. At this point exhausting conservative treatment and in the presence of a neurologic deficit we discussed microlumbar decompression. I discussed the risks and benefits including bleeding, infection, DVT, PE, anesthetic complications, worsening in their symptoms, improvement in their symptoms, C SF leakage, epidural fibrosis, need for future surgeries such as revision  discectomy and lumbar fusion. I also indicated that this is an operation to basically decompress the nerve roots to allow recovery as opposed to fixing a herniated disc if it is encountered and that the incidence of recurrent chest disc herniation can approach 15%. Also that nerve root recovery is variable and may not recover completely. Any ligament or bone that is contributing to compressing the nerves will be removed as well.  I discussed the operative course including overnight in the hospital. Immediate ambulation. Follow-up in 2 weeks for suture removal. 6 weeks until healing of the herniation and surgical incision followed by 6 weeks of reconditioning and strengthening of the core musculature. Also discussed the need to employ the concepts of disc pressure management and core motion following the surgery to minimize  the risk of recurrent disc herniation. We will obtain preoperative clearance i if necessary and proceed accordingly.  Preoperative clearance.. Hydrocodone or oxycodone.  Physical therapy prior. The patient has undergone 3 months of physical therapy at the beginning of the year. Given now that her symptoms have changed since I last saw her I would recommend not delaying the surgical procedure any further.  Patient is to call or present to the emergency room if there is any numbness or weakness to suggest worsening of their condition. In addition although rare if there is loss of bowel or bladder function such as incontinence or inability to void that this may represent a cauda equina syndrome. If noted the patient is to present immediately to the emergency room for evaluation and treatment otherwise permanent bowel or bladder dysfunction can occur as a result.  Plan central decompression L4-5 with lateral mass fusion with autograft and allograft bone   Dorothy Spark, PA-C for Dr Shelle Iron 01/19/2023, 1:50 PM

## 2023-01-19 NOTE — H&P (Signed)
Chelsea Morgan is an 72 y.o. female.   Chief Complaint: back and leg pain HPI: Reason for Visit: (normal) visit for: (back); Years Location (Lower Extremity): lower back pain ; leg pain bilateral, right side is worse, Severity: pain level 6/10 Quality: burning; stabbing Aggravating Factors: standing for ; Worse when first standing Associated Symptoms: numbness/tingling (feet) Medications: helping a little (Ibuprofen); Tylenol and Ibuprofen Notes: Insurance denial- decompression L4-5 with lateral mass fusion Was brought in as I called her and she is having numbness into her feet now. Mainly on the right side she is reporting some weakness in her legs when she walks. No change in bowel or bladder function fevers or chills. Weakness into the right leg in particular.  Past Medical History:  Diagnosis Date   ALLERGIC RHINITIS 01/12/2008   Amyloidosis of larynx (HCC)    s/p Coblator excision of false vocal fold lesion 07/12/19, pathology "AL amyloid, Kappa" most compatible tracheobronchial amyloidosis   Arthritis    back   Atrial flutter (HCC)    Blood transfusion without reported diagnosis 03/09/1979   after ectopic pregnancy   CAROTID BRUITS, BILATERAL 04/01/2009   Dysrhythmia    Essential hypertension, benign 01/12/2008   HYPERLIPIDEMIA 05/02/2007   Rheumatic fever     Past Surgical History:  Procedure Laterality Date   ABLATION  12/28/2013   atrial flutter ablation by Dr Ladona Ridgel   ATRIAL FLUTTER ABLATION N/A 12/28/2013   Procedure: ATRIAL FLUTTER ABLATION;  Surgeon: Marinus Maw, MD;  Location: [facility] CATH LAB;  Service: Cardiovascular;  Laterality: N/A;   CARPAL TUNNEL RELEASE Right 03/08/2005   CATARACT EXTRACTION Bilateral 2022   EYE SURGERY     SALPINGECTOMY  03/09/1979    Family History  Problem Relation Age of Onset   Cancer Mother        stomach   Esophageal cancer Mother 39       per pt esophageal ca at [facility]   Stroke Father    Cancer Paternal Aunt        colon    Colon cancer Paternal Aunt 87   Rectal cancer Neg Hx    Stomach cancer Neg Hx    Social History:  reports that she has never smoked. She has never used smokeless tobacco. She reports current alcohol use. She reports that she does not currently use drugs.  Allergies:  Allergies  Allergen Reactions   Bee Venom Swelling   Sulfa Antibiotics Swelling    Hospitalized x 3 days 20001   Current meds: atorvastatin 10 mg tablet atorvastatin 20 mg tablet BinaxNOW COVID-19 Ag Self Test kit carvediloL 12.5 mg tablet finasteride 5 mg tablet hydroCHLOROthiazide 25 mg tablet ibuprofen 800 mg tablet terbinafine HCL 250 mg tablet TylenoL  Review of Systems  Constitutional: Negative.   HENT: Negative.    Eyes: Negative.   Respiratory: Negative.    Cardiovascular: Negative.   Gastrointestinal: Negative.   Endocrine: Negative.   Genitourinary: Negative.   Musculoskeletal:  Positive for back pain and gait problem.  Skin: Negative.   Neurological:  Positive for weakness and numbness.  Psychiatric/Behavioral: Negative.      There were no vitals taken for this visit. Physical Exam Constitutional:      Appearance: Normal appearance.  HENT:     Head: Normocephalic and atraumatic.     Right Ear: External ear normal.     Left Ear: External ear normal.     Nose: Nose normal.     Mouth/Throat:  Pharynx: Oropharynx is clear.  Eyes:     Conjunctiva/sclera: Conjunctivae normal.  Cardiovascular:     Rate and Rhythm: Normal rate and regular rhythm.     Pulses: Normal pulses.     Heart sounds: Normal heart sounds.  Pulmonary:     Effort: Pulmonary effort is normal.     Breath sounds: Normal breath sounds.  Abdominal:     General: Bowel sounds are normal.  Musculoskeletal:     Cervical back: Normal range of motion.     Comments: Straight leg raise on the right is positive on the left is buttock pain. EHL is now 4/5 bilaterally. She has decreased sensation on plantar aspect of the right  foot. She walks with a forward flexed antalgic  Skin:    General: Skin is warm and dry.  Neurological:     Mental Status: She is alert.    X-rays lumbar spine demonstrates a levorotatory scoliosis. Apex at L1. Mild osteoarthritis of the hips. A minimal grade 1 spinal listhesis at L4-5. Calcification of the aorta. Significant osteopenia  MRI lumbar spine from last month demonstrates severe spinal stenosis at L4-5 due to ligamentum flavum and facet hypertrophy. There are no facet effusions noted. There is no instability in flexion-extension.  Assessment/Plan Impression:  1. Persistent radiculopathy and neurogenic claudication secondary to severe spinal stenosis at L4-5 with a minimal spondylolisthesis with underlying scoliosis refractory to conservative treatment. Now with bilateral EHL weakness and diminished sensation to plantar aspect of her foot.  Plan:  Again we have discussed options but given the progressive neurologic deficit I would recommend as previously suggested a lumbar decompression L4-5 and a lateral mass fusion given her underlying osteoporosis.   Specifically discussing spinal stenosis. I also discussed the natural history and the progression of spinal stenosis. That the spinal canal dimensions increase with flexion and sitting while it decreases with extension such as standing straight in one position or lying flat on one's back or stomach.  The concepts of avoiding extension and favoring flexion throughout their activities of daily living to avoid neural compression were discussed. Also utilization of a stepstool while standing or sitting to tilt the pelvis to open up the spinal canal as well as gently leaning forward both with walking and with activity. Use of hiking sticks versus a rolling walker in more severe cases to promote forward flexion while walking. Exercise utilizing a stationary or recumbent bike to allow for more room inside the spinal canal. Avoiding extension  such as direct overhead work as well as Chief of Staff on a hard surface. Utilizing a wedge underneath the knees while sleeping on their back and a pillow between the knees when sleeping on their sides. And that typically a softer mattress is more accommodating than a hard one.  We discussed injection therapy although I feel that would be temporary given the degree of her stenosis.  We discussed medical management other than Tylenol and ibuprofen she is taken. She is not interested in taking stronger medication.   We discussed the decompression at L4-5 and a likely lateral mass fusion given her underlying osteoporosis I had an extensive discussion with the patient concerning the pathology relevant anatomy and treatment options. At this point exhausting conservative treatment and in the presence of a neurologic deficit we discussed microlumbar decompression. I discussed the risks and benefits including bleeding, infection, DVT, PE, anesthetic complications, worsening in their symptoms, improvement in their symptoms, C SF leakage, epidural fibrosis, need for future surgeries such as revision  discectomy and lumbar fusion. I also indicated that this is an operation to basically decompress the nerve roots to allow recovery as opposed to fixing a herniated disc if it is encountered and that the incidence of recurrent chest disc herniation can approach 15%. Also that nerve root recovery is variable and may not recover completely. Any ligament or bone that is contributing to compressing the nerves will be removed as well.  I discussed the operative course including overnight in the hospital. Immediate ambulation. Follow-up in 2 weeks for suture removal. 6 weeks until healing of the herniation and surgical incision followed by 6 weeks of reconditioning and strengthening of the core musculature. Also discussed the need to employ the concepts of disc pressure management and core motion following the surgery to minimize  the risk of recurrent disc herniation. We will obtain preoperative clearance i if necessary and proceed accordingly.  Preoperative clearance.. Hydrocodone or oxycodone.  Physical therapy prior. The patient has undergone 3 months of physical therapy at the beginning of the year. Given now that her symptoms have changed since I last saw her I would recommend not delaying the surgical procedure any further.  Patient is to call or present to the emergency room if there is any numbness or weakness to suggest worsening of their condition. In addition although rare if there is loss of bowel or bladder function such as incontinence or inability to void that this may represent a cauda equina syndrome. If noted the patient is to present immediately to the emergency room for evaluation and treatment otherwise permanent bowel or bladder dysfunction can occur as a result.  Plan central decompression L4-5 with lateral mass fusion with autograft and allograft bone   Dorothy Spark, PA-C for Dr Shelle Iron 01/19/2023, 1:50 PM

## 2023-01-20 ENCOUNTER — Ambulatory Visit (HOSPITAL_COMMUNITY): Payer: Self-pay | Admitting: Vascular Surgery

## 2023-01-20 ENCOUNTER — Other Ambulatory Visit: Payer: Self-pay

## 2023-01-20 ENCOUNTER — Encounter (HOSPITAL_COMMUNITY)
Admission: RE | Disposition: A | Discharge status: Home / Self Care | Payer: Self-pay | Source: Home / Self Care | Attending: Specialist

## 2023-01-20 ENCOUNTER — Ambulatory Visit (HOSPITAL_COMMUNITY)
Admission: RE | Admit: 2023-01-20 | Discharge: 2023-01-20 | Disposition: A | Discharge status: Home / Self Care | Payer: Medicare HMO | Attending: Specialist | Admitting: Specialist

## 2023-01-20 ENCOUNTER — Ambulatory Visit (HOSPITAL_COMMUNITY): Payer: Medicare HMO

## 2023-01-20 ENCOUNTER — Ambulatory Visit (HOSPITAL_COMMUNITY): Payer: Medicare HMO | Admitting: Certified Registered"

## 2023-01-20 DIAGNOSIS — M48062 Spinal stenosis, lumbar region with neurogenic claudication: Secondary | ICD-10-CM | POA: Diagnosis not present

## 2023-01-20 DIAGNOSIS — M7989 Other specified soft tissue disorders: Secondary | ICD-10-CM | POA: Diagnosis not present

## 2023-01-20 DIAGNOSIS — M5416 Radiculopathy, lumbar region: Secondary | ICD-10-CM | POA: Diagnosis not present

## 2023-01-20 DIAGNOSIS — M8580 Other specified disorders of bone density and structure, unspecified site: Secondary | ICD-10-CM | POA: Diagnosis not present

## 2023-01-20 DIAGNOSIS — I480 Paroxysmal atrial fibrillation: Secondary | ICD-10-CM | POA: Diagnosis not present

## 2023-01-20 DIAGNOSIS — M48061 Spinal stenosis, lumbar region without neurogenic claudication: Secondary | ICD-10-CM | POA: Diagnosis not present

## 2023-01-20 DIAGNOSIS — I10 Essential (primary) hypertension: Secondary | ICD-10-CM | POA: Diagnosis not present

## 2023-01-20 DIAGNOSIS — M4316 Spondylolisthesis, lumbar region: Secondary | ICD-10-CM | POA: Diagnosis not present

## 2023-01-20 DIAGNOSIS — M810 Age-related osteoporosis without current pathological fracture: Secondary | ICD-10-CM | POA: Insufficient documentation

## 2023-01-20 DIAGNOSIS — E785 Hyperlipidemia, unspecified: Secondary | ICD-10-CM | POA: Diagnosis not present

## 2023-01-20 DIAGNOSIS — M5116 Intervertebral disc disorders with radiculopathy, lumbar region: Secondary | ICD-10-CM | POA: Diagnosis not present

## 2023-01-20 HISTORY — PX: LUMBAR LAMINECTOMY/DECOMPRESSION MICRODISCECTOMY: SHX5026

## 2023-01-20 LAB — ABO/RH: ABO/RH(D): O POS

## 2023-01-20 SURGERY — LUMBAR LAMINECTOMY/DECOMPRESSION MICRODISCECTOMY 1 LEVEL
Anesthesia: General

## 2023-01-20 MED ORDER — BSS IO SOLN
15.0000 mL | Freq: Once | INTRAOCULAR | Status: AC
Start: 2023-01-20 — End: 2023-01-20
  Administered 2023-01-20: 15 mL
  Filled 2023-01-20 (×2): qty 15

## 2023-01-20 MED ORDER — CHLORHEXIDINE GLUCONATE 0.12 % MT SOLN
15.0000 mL | Freq: Once | OROMUCOSAL | Status: AC
  Administered 2023-01-20: 15 mL via OROMUCOSAL
  Filled 2023-01-20: qty 15

## 2023-01-20 MED ORDER — THROMBIN 20000 UNITS EX SOLR
CUTANEOUS | Status: AC
  Filled 2023-01-20: qty 20000

## 2023-01-20 MED ORDER — RISAQUAD PO CAPS
1.0000 | ORAL_CAPSULE | Freq: Every day | ORAL | Status: DC
  Administered 2023-01-20: 1 via ORAL
  Filled 2023-01-20: qty 1

## 2023-01-20 MED ORDER — OXYCODONE HCL 5 MG PO TABS: 5.0000 mg | ORAL_TABLET | Freq: Once | ORAL | Status: DC | PRN

## 2023-01-20 MED ORDER — CEFAZOLIN SODIUM-DEXTROSE 2-4 GM/100ML-% IV SOLN
2.0000 g | INTRAVENOUS | Status: AC
  Administered 2023-01-20: 2 g via INTRAVENOUS
  Filled 2023-01-20: qty 100

## 2023-01-20 MED ORDER — FENTANYL CITRATE (PF) 250 MCG/5ML IJ SOLN
INTRAMUSCULAR | Status: DC | PRN
  Administered 2023-01-20 (×3): 50 ug via INTRAVENOUS

## 2023-01-20 MED ORDER — ROCURONIUM BROMIDE 10 MG/ML (PF) SYRINGE
PREFILLED_SYRINGE | INTRAVENOUS | Status: DC | PRN
  Administered 2023-01-20: 10 mg via INTRAVENOUS
  Administered 2023-01-20: 50 mg via INTRAVENOUS

## 2023-01-20 MED ORDER — MAGNESIUM CITRATE PO SOLN: 1.0000 | Freq: Once | ORAL | Status: DC | PRN

## 2023-01-20 MED ORDER — OXYCODONE HCL 5 MG PO TABS: 5.0000 mg | ORAL_TABLET | ORAL | Status: DC | PRN

## 2023-01-20 MED ORDER — SUGAMMADEX SODIUM 200 MG/2ML IV SOLN
INTRAVENOUS | Status: DC | PRN
  Administered 2023-01-20: 200 mg via INTRAVENOUS

## 2023-01-20 MED ORDER — LACTATED RINGERS IV SOLN: INTRAVENOUS | Status: DC

## 2023-01-20 MED ORDER — ACETAMINOPHEN 325 MG PO TABS: 650.0000 mg | ORAL_TABLET | ORAL | Status: DC | PRN

## 2023-01-20 MED ORDER — ATORVASTATIN CALCIUM 10 MG PO TABS
10.0000 mg | ORAL_TABLET | Freq: Every day | ORAL | Status: DC
Start: 2023-01-21 — End: 2023-01-20

## 2023-01-20 MED ORDER — KETOROLAC TROMETHAMINE 0.5 % OP SOLN
OPHTHALMIC | Status: AC
  Filled 2023-01-20: qty 5

## 2023-01-20 MED ORDER — CARVEDILOL 6.25 MG PO TABS: 6.2500 mg | ORAL_TABLET | Freq: Two times a day (BID) | ORAL | Status: DC

## 2023-01-20 MED ORDER — CIPROFLOXACIN HCL 0.3 % OP SOLN
1.0000 [drp] | Freq: Four times a day (QID) | OPHTHALMIC | Status: DC
  Administered 2023-01-20: 1 [drp] via OPHTHALMIC
  Filled 2023-01-20: qty 2.5

## 2023-01-20 MED ORDER — COQ10 100 MG PO CAPS: 100.0000 mg | ORAL_CAPSULE | Freq: Every evening | ORAL | Status: DC

## 2023-01-20 MED ORDER — HYDROCORTISONE 1 % EX CREA: 1.0000 | TOPICAL_CREAM | CUTANEOUS | Status: DC | PRN

## 2023-01-20 MED ORDER — KCL IN DEXTROSE-NACL 20-5-0.9 MEQ/L-%-% IV SOLN: INTRAVENOUS | Status: DC

## 2023-01-20 MED ORDER — DOCUSATE SODIUM 100 MG PO CAPS: 100.0000 mg | ORAL_CAPSULE | Freq: Two times a day (BID) | ORAL | 2 refills | Status: DC

## 2023-01-20 MED ORDER — TRANEXAMIC ACID-NACL 1000-0.7 MG/100ML-% IV SOLN
1000.0000 mg | INTRAVENOUS | Status: AC
  Administered 2023-01-20: 1000 mg via INTRAVENOUS
  Filled 2023-01-20: qty 100

## 2023-01-20 MED ORDER — OXYCODONE HCL 5 MG/5ML PO SOLN
5.0000 mg | Freq: Once | ORAL | Status: DC | PRN
Start: 2023-01-20 — End: 2023-01-20

## 2023-01-20 MED ORDER — ONDANSETRON HCL 4 MG PO TABS: 4.0000 mg | ORAL_TABLET | Freq: Four times a day (QID) | ORAL | Status: DC | PRN

## 2023-01-20 MED ORDER — BUPIVACAINE-EPINEPHRINE (PF) 0.5% -1:200000 IJ SOLN
INTRAMUSCULAR | Status: AC
  Filled 2023-01-20: qty 30

## 2023-01-20 MED ORDER — LIDOCAINE 2% (20 MG/ML) 5 ML SYRINGE
INTRAMUSCULAR | Status: DC | PRN
  Administered 2023-01-20: 80 mg via INTRAVENOUS

## 2023-01-20 MED ORDER — PROPOFOL 10 MG/ML IV BOLUS
INTRAVENOUS | Status: DC | PRN
  Administered 2023-01-20: 110 mg via INTRAVENOUS

## 2023-01-20 MED ORDER — THROMBIN 20000 UNITS EX SOLR: CUTANEOUS | Status: DC | PRN

## 2023-01-20 MED ORDER — BISACODYL 5 MG PO TBEC: 5.0000 mg | DELAYED_RELEASE_TABLET | Freq: Every day | ORAL | Status: DC | PRN

## 2023-01-20 MED ORDER — BUPIVACAINE-EPINEPHRINE 0.5% -1:200000 IJ SOLN
INTRAMUSCULAR | Status: DC | PRN
  Administered 2023-01-20: 10 mL

## 2023-01-20 MED ORDER — OXYCODONE HCL 5 MG PO TABS: 10.0000 mg | ORAL_TABLET | ORAL | Status: DC | PRN

## 2023-01-20 MED ORDER — FENTANYL CITRATE (PF) 100 MCG/2ML IJ SOLN: 25.0000 ug | INTRAMUSCULAR | Status: DC | PRN

## 2023-01-20 MED ORDER — CEFAZOLIN SODIUM-DEXTROSE 2-4 GM/100ML-% IV SOLN
2.0000 g | Freq: Three times a day (TID) | INTRAVENOUS | Status: DC
  Administered 2023-01-20: 2 g via INTRAVENOUS
  Filled 2023-01-20: qty 100

## 2023-01-20 MED ORDER — FINASTERIDE 5 MG PO TABS: 5.0000 mg | ORAL_TABLET | Freq: Every day | ORAL | Status: DC

## 2023-01-20 MED ORDER — KETOROLAC TROMETHAMINE 0.5 % OP SOLN
1.0000 [drp] | Freq: Four times a day (QID) | OPHTHALMIC | Status: DC
  Administered 2023-01-20: 1 [drp] via OPHTHALMIC
  Filled 2023-01-20: qty 5

## 2023-01-20 MED ORDER — B COMPLEX VITAMINS PO CAPS: 1.0000 | ORAL_CAPSULE | Freq: Every day | ORAL | Status: DC

## 2023-01-20 MED ORDER — ONDANSETRON HCL 4 MG/2ML IJ SOLN
INTRAMUSCULAR | Status: DC | PRN
  Administered 2023-01-20: 4 mg via INTRAVENOUS

## 2023-01-20 MED ORDER — METHOCARBAMOL 500 MG PO TABS: 500.0000 mg | ORAL_TABLET | Freq: Four times a day (QID) | ORAL | Status: DC | PRN

## 2023-01-20 MED ORDER — MIDAZOLAM HCL 2 MG/2ML IJ SOLN
INTRAMUSCULAR | Status: AC
Start: 2023-01-20 — End: ?
  Filled 2023-01-20: qty 2

## 2023-01-20 MED ORDER — OXYCODONE HCL 5 MG PO TABS: 5.0000 mg | ORAL_TABLET | ORAL | 0 refills | Status: DC | PRN

## 2023-01-20 MED ORDER — DOCUSATE SODIUM 100 MG PO CAPS
100.0000 mg | ORAL_CAPSULE | Freq: Two times a day (BID) | ORAL | Status: DC
  Administered 2023-01-20: 100 mg via ORAL
  Filled 2023-01-20: qty 1

## 2023-01-20 MED ORDER — DEXAMETHASONE SODIUM PHOSPHATE 10 MG/ML IJ SOLN
INTRAMUSCULAR | Status: AC
  Filled 2023-01-20: qty 1

## 2023-01-20 MED ORDER — ONDANSETRON HCL 4 MG/2ML IJ SOLN
INTRAMUSCULAR | Status: AC
  Filled 2023-01-20: qty 2

## 2023-01-20 MED ORDER — POLYETHYLENE GLYCOL 3350 17 G PO PACK: 17.0000 g | PACK | Freq: Every day | ORAL | Status: DC | PRN

## 2023-01-20 MED ORDER — 0.9 % SODIUM CHLORIDE (POUR BTL) OPTIME
TOPICAL | Status: DC | PRN
  Administered 2023-01-20: 1000 mL

## 2023-01-20 MED ORDER — PHENOL 1.4 % MT LIQD: 1.0000 | OROMUCOSAL | Status: DC | PRN

## 2023-01-20 MED ORDER — MAGNESIUM OXIDE -MG SUPPLEMENT 400 (240 MG) MG PO TABS
200.0000 mg | ORAL_TABLET | Freq: Every day | ORAL | Status: DC
Start: 2023-01-20 — End: 2023-01-20

## 2023-01-20 MED ORDER — ONDANSETRON HCL 4 MG/2ML IJ SOLN
4.0000 mg | Freq: Four times a day (QID) | INTRAMUSCULAR | Status: DC | PRN
Start: 2023-01-20 — End: 2023-01-20

## 2023-01-20 MED ORDER — METHOCARBAMOL 1000 MG/10ML IJ SOLN
500.0000 mg | Freq: Four times a day (QID) | INTRAMUSCULAR | Status: DC | PRN
Start: 2023-01-20 — End: 2023-01-20

## 2023-01-20 MED ORDER — PHENYLEPHRINE HCL-NACL 20-0.9 MG/250ML-% IV SOLN
INTRAVENOUS | Status: DC | PRN
  Administered 2023-01-20: 50 ug/min via INTRAVENOUS

## 2023-01-20 MED ORDER — EPINEPHRINE 0.3 MG/0.3ML IJ DEVI: 0.3000 mg | INTRAMUSCULAR | Status: DC | PRN

## 2023-01-20 MED ORDER — DEXAMETHASONE SODIUM PHOSPHATE 10 MG/ML IJ SOLN
INTRAMUSCULAR | Status: DC | PRN
  Administered 2023-01-20: 10 mg via INTRAVENOUS

## 2023-01-20 MED ORDER — METHOCARBAMOL 500 MG PO TABS: 500.0000 mg | ORAL_TABLET | Freq: Three times a day (TID) | ORAL | 1 refills | Status: DC | PRN

## 2023-01-20 MED ORDER — ORAL CARE MOUTH RINSE
15.0000 mL | Freq: Once | OROMUCOSAL | Status: AC
Start: 2023-01-20 — End: 2023-01-20

## 2023-01-20 MED ORDER — ACETAMINOPHEN 500 MG PO TABS
1000.0000 mg | ORAL_TABLET | Freq: Four times a day (QID) | ORAL | Status: DC
  Administered 2023-01-20: 1000 mg via ORAL
  Filled 2023-01-20: qty 2

## 2023-01-20 MED ORDER — ONDANSETRON HCL 4 MG/2ML IJ SOLN: 4.0000 mg | Freq: Four times a day (QID) | INTRAMUSCULAR | Status: DC | PRN

## 2023-01-20 MED ORDER — PHENYLEPHRINE 80 MCG/ML (10ML) SYRINGE FOR IV PUSH (FOR BLOOD PRESSURE SUPPORT)
PREFILLED_SYRINGE | INTRAVENOUS | Status: DC | PRN
  Administered 2023-01-20: 120 ug via INTRAVENOUS
  Administered 2023-01-20: 80 ug via INTRAVENOUS

## 2023-01-20 MED ORDER — HYDROCHLOROTHIAZIDE 25 MG PO TABS
25.0000 mg | ORAL_TABLET | Freq: Every day | ORAL | Status: DC
Start: 2023-01-21 — End: 2023-01-20

## 2023-01-20 MED ORDER — ALUM & MAG HYDROXIDE-SIMETH 200-200-20 MG/5ML PO SUSP: 30.0000 mL | Freq: Four times a day (QID) | ORAL | Status: DC | PRN

## 2023-01-20 MED ORDER — POTASSIUM 99 MG PO TABS: ORAL_TABLET | Freq: Every day | ORAL | Status: DC

## 2023-01-20 MED ORDER — LORATADINE 10 MG PO TABS: 5.0000 mg | ORAL_TABLET | Freq: Every day | ORAL | Status: DC | PRN

## 2023-01-20 MED ORDER — ACETAMINOPHEN 650 MG RE SUPP: 650.0000 mg | RECTAL | Status: DC | PRN

## 2023-01-20 MED ORDER — HYDROMORPHONE HCL 1 MG/ML IJ SOLN: 0.5000 mg | INTRAMUSCULAR | Status: DC | PRN

## 2023-01-20 MED ORDER — ACETAMINOPHEN 10 MG/ML IV SOLN
1000.0000 mg | INTRAVENOUS | Status: AC
  Administered 2023-01-20: 1000 mg via INTRAVENOUS
  Filled 2023-01-20: qty 100

## 2023-01-20 MED ORDER — POLYETHYLENE GLYCOL 3350 17 G PO PACK
17.0000 g | PACK | Freq: Every day | ORAL | 0 refills | Status: DC
Start: 2023-01-20 — End: 2023-06-06

## 2023-01-20 MED ORDER — PROPOFOL 10 MG/ML IV BOLUS
INTRAVENOUS | Status: AC
  Filled 2023-01-20: qty 20

## 2023-01-20 MED ORDER — FENTANYL CITRATE (PF) 250 MCG/5ML IJ SOLN
INTRAMUSCULAR | Status: AC
  Filled 2023-01-20: qty 5

## 2023-01-20 MED ORDER — MENTHOL 3 MG MT LOZG: 1.0000 | LOZENGE | OROMUCOSAL | Status: DC | PRN

## 2023-01-20 MED ORDER — ROCURONIUM BROMIDE 10 MG/ML (PF) SYRINGE
PREFILLED_SYRINGE | INTRAVENOUS | Status: AC
  Filled 2023-01-20: qty 10

## 2023-01-20 SURGICAL SUPPLY — 63 items
BAG COUNTER SPONGE SURGICOUNT (BAG) ×2 IMPLANT
BAG DECANTER FOR FLEXI CONT (MISCELLANEOUS) IMPLANT
BAND RUBBER #18 3X1/16 STRL (MISCELLANEOUS) ×4 IMPLANT
BUR EGG ELITE 5.0 (BURR) IMPLANT
BUR RND DIAMOND ELITE 4.0 (BURR) IMPLANT
CANNULA GRAFT BNE VG PRE-FILL (Bone Implant) IMPLANT
CLEANER TIP ELECTROSURG 2X2 (MISCELLANEOUS) ×2 IMPLANT
CNTNR URN SCR LID CUP LEK RST (MISCELLANEOUS) ×2 IMPLANT
CONT SPEC 4OZ STRL OR WHT (MISCELLANEOUS) ×1
DISPENSER GRAFT BNE VG (MISCELLANEOUS) IMPLANT
DISPENSER VIVIGEN BONE GRAFT (MISCELLANEOUS) ×1
DRAPE LAPAROTOMY 100X72X124 (DRAPES) ×2 IMPLANT
DRAPE MICROSCOPE SLANT 54X150 (MISCELLANEOUS) ×2 IMPLANT
DRAPE SHEET LG 3/4 BI-LAMINATE (DRAPES) ×2 IMPLANT
DRAPE SURG 17X11 SM STRL (DRAPES) ×2 IMPLANT
DRAPE UTILITY XL STRL (DRAPES) ×2 IMPLANT
DRSG AQUACEL AG ADV 3.5X 4 (GAUZE/BANDAGES/DRESSINGS) IMPLANT
DRSG AQUACEL AG ADV 3.5X 6 (GAUZE/BANDAGES/DRESSINGS) IMPLANT
DRSG TELFA 3X8 NADH STRL (GAUZE/BANDAGES/DRESSINGS) IMPLANT
DURAPREP 26ML APPLICATOR (WOUND CARE) ×2 IMPLANT
DURASEAL SPINE SEALANT 3ML (MISCELLANEOUS) IMPLANT
ELECT BLADE 4.0 EZ CLEAN MEGAD (MISCELLANEOUS)
ELECT REM PT RETURN 9FT ADLT (ELECTROSURGICAL) ×1
ELECTRODE BLDE 4.0 EZ CLN MEGD (MISCELLANEOUS) IMPLANT
ELECTRODE REM PT RTRN 9FT ADLT (ELECTROSURGICAL) ×2 IMPLANT
GLOVE BIOGEL PI IND STRL 7.5 (GLOVE) ×2 IMPLANT
GLOVE SURG SS PI 7.0 STRL IVOR (GLOVE) ×2 IMPLANT
GLOVE SURG SS PI 8.0 STRL IVOR (GLOVE) ×4 IMPLANT
GOWN STRL REUS W/ TWL LRG LVL3 (GOWN DISPOSABLE) ×2 IMPLANT
GOWN STRL REUS W/ TWL XL LVL3 (GOWN DISPOSABLE) ×2 IMPLANT
GOWN STRL REUS W/TWL LRG LVL3 (GOWN DISPOSABLE) ×4
GOWN STRL REUS W/TWL XL LVL3 (GOWN DISPOSABLE) ×1
GRAFT BONE CANNULA VIVIGEN 3 (Bone Implant) ×2 IMPLANT
IV CATH 14GX2 1/4 (CATHETERS) ×2 IMPLANT
KIT BASIN OR (CUSTOM PROCEDURE TRAY) ×2 IMPLANT
NDL 22X1.5 STRL (OR ONLY) (MISCELLANEOUS) ×2 IMPLANT
NDL SPNL 18GX3.5 QUINCKE PK (NEEDLE) ×4 IMPLANT
NEEDLE 22X1.5 STRL (OR ONLY) (MISCELLANEOUS) ×1
NEEDLE SPNL 18GX3.5 QUINCKE PK (NEEDLE) ×2
PACK LAMINECTOMY NEURO (CUSTOM PROCEDURE TRAY) ×2 IMPLANT
PATTIES SURGICAL .25X.25 (GAUZE/BANDAGES/DRESSINGS) IMPLANT
PATTIES SURGICAL .75X.75 (GAUZE/BANDAGES/DRESSINGS) ×2 IMPLANT
SOLUTION PRONTOSAN WOUND 350ML (IRRIGATION / IRRIGATOR) IMPLANT
SPONGE SURGIFOAM ABS GEL 100 (HEMOSTASIS) ×2 IMPLANT
SPONGE T-LAP 4X18 ~~LOC~~+RFID (SPONGE) IMPLANT
STAPLER VISISTAT (STAPLE) IMPLANT
STRIP CLOSURE SKIN 1/2X4 (GAUZE/BANDAGES/DRESSINGS) ×2 IMPLANT
SUT NURALON 4 0 TR CR/8 (SUTURE) IMPLANT
SUT PROLENE 3 0 PS 2 (SUTURE) IMPLANT
SUT VIC AB 1 CT1 18XBRD ANBCTR (SUTURE) IMPLANT
SUT VIC AB 1 CT1 27 (SUTURE) ×1
SUT VIC AB 1 CT1 27XBRD ANTBC (SUTURE) IMPLANT
SUT VIC AB 1 CT1 8-18 (SUTURE) ×1
SUT VIC AB 1-0 CT2 27 (SUTURE) IMPLANT
SUT VIC AB 2-0 CT1 27 (SUTURE) ×1
SUT VIC AB 2-0 CT1 TAPERPNT 27 (SUTURE) IMPLANT
SUT VIC AB 2-0 CT2 27 (SUTURE) IMPLANT
SYR 3ML LL SCALE MARK (SYRINGE) ×2 IMPLANT
TOWEL GREEN STERILE (TOWEL DISPOSABLE) ×2 IMPLANT
TOWEL GREEN STERILE FF (TOWEL DISPOSABLE) ×2 IMPLANT
TRAY FOLEY MTR SLVR 16FR STAT (SET/KITS/TRAYS/PACK) ×2 IMPLANT
WIPE CHG 2% 2PK PREOPERATIVE (MISCELLANEOUS) ×2 IMPLANT
YANKAUER SUCT BULB TIP NO VENT (SUCTIONS) ×2 IMPLANT

## 2023-01-20 NOTE — Op Note (Signed)
Chelsea Morgan, BETTERS MEDICAL RECORD NO: [id number] ACCOUNT NO: [id number] DATE OF BIRTH: Jan 20, 1951 FACILITY: [facility] LOCATION: [facility] PHYSICIAN: Javier Docker, MD  Operative Report   DATE OF PROCEDURE: 01/20/2023  PREOPERATIVE DIAGNOSES: 1.  Spinal stenosis, grade 1 spondylolisthesis at L4-L5. 2.  Osteoporosis.  POSTOPERATIVE DIAGNOSES: 1.  Spinal stenosis, grade 1 spondylolisthesis at L4-L5. 2.  Osteoporosis.  PROCEDURE PERFORMED: 1.  Central laminectomy at L4-L5 with bilateral L4 foraminotomies, L5 foraminotomies. 2.  Lysis of epidural venous plexus. 3.  In situ lateral mass fusion utilizing autologous and allograft bone ViviGen.  ANESTHESIA:  General.  ASSISTANT:  Orland Penman, PA.  HISTORY:  This is a very pleasant 72 year old female with bilateral lower extremity radicular pain in the L5 nerve root distribution secondary to severe spinal stenosis multifactorial at L4-L5.  She had a grade 1 spondylolisthesis and a scoliosis and  underlying osteoporosis.  The patient, being refractory to conservative treatment, she was indicated for lumbar decompression L4-L5, and lateral mass fusion due to the underlying bone density deficit.  We discussed risks and benefits including bleeding,  infection, damage to neurovascular structures, no change in symptoms or worsening symptoms, DVT, PE, anesthetic complications, etc.  DESCRIPTION OF PROCEDURE:  The patient in the supine position after induction of adequate general anesthesia and 2 g of Kefzol.  She was placed prone on the Wilson frame.  All bony prominences well padded.  Lumbar regions were prepped and draped in the  usual sterile fashion.  Two 18-gauge spinal needles were utilized to localize the L4 and L5 spinous processes.  Incisions were made from the spinous process of 4 to 5.  Subcutaneous tissue was dissected.  Electrocautery was utilized to achieve  hemostasis.  The dorsal lumbar fascia divided in line with the skin  incision.  Paraspinous muscle elevated from lamina L4-L5 bilaterally.  Operating microscope was draped and brought into the surgical field.  Placed a Kocher on the spinous processes of 4  and 5 and confirmed by x-ray.  We placed a Veterinary surgeon.  An operating microscope was draped and brought on the surgical field.  The spinous processes of 4 and 5 were then skeletonized and removed with a Leksell rongeur.  The bone quality was  fairly soft.  Centrally we used a Automotive engineer and a Woodson centrally.  I performed hemilaminotomies at the caudad edge of 4 first in the center, cephalad to the point detaching the ligamentum flavum.  Hypertrophic ligamentum flavum was noted in the  interspace bilaterally.  A straight curette utilized to attached ligamentum flavum from the cephalad edge of 5 bilaterally.  I used a Woodson and a Penfield to enter the epidural space.  She had severe adhesions to the thecal sac on the left.  I entered,  therefore, on the right developing a plane between the thecal sac and the hypertrophic ligamentum flavum.  This was then removed with a 2 mm Kerrison.  I detached ligamentum flavum from the cephalad edge of 5.  Performed hemilaminotomies of the cephalad  edge of 5.  Adhesions were noted centrally as well.  I then decompressed the lateral recess to the medial border of the pedicles.  Performed a foraminotomy of L4 and L5.  There was no disk herniation noted.  Severe compression of the thecal sac.  L5  nerve root was noted in the lateral recess.  On the left, she had significant adhesions.  Again, I then continued cephalad with 2 mm Kerrison and a Woodson to the point above  the adhesions developing a plane between the thecal sac and the ligamentum  flavum.  I did this distally as well, detached the ligamentum flavum using a Woodson.  Performed a foraminotomy of L5, identifying the L5 root.  There was epidural venous plexus in the lateral recess that was cauterized.  Hypertrophic  ligamentum flavum  was noted as well here and decompressed the lateral recess to the medial border of the pedicle.  Then with a combination of Woodson and a Penfield, sequentially developed a plane between the thecal sac and the adhesed ligamentum flavum and remnants of  previous epidural injections.  This freed the thecal sac.  I was able to decompress the lateral recess satisfactorily.  There was one area essentially to the left where a thin layer of ligamentum was adhesed to the thecal sac that was left as that was  not causing any compression of the nerve roots.  There was no disk herniation noted on the left either.  I obtained confirmatory radiographs of Woodson's in the foramen of 4 and 5.  I had to use the high-speed burr initially to assist with the  laminotomy.  Bone wax was placed in the cancellous surfaces.  We copiously irrigated.  Inspection revealed good restoration of the thecal sac.  Woodson probe passed freely out the foramen of 4 and 5 and above the pedicle of 4.  A Valsalva was performed  to 40 and inspection under the microscope revealed no evidence of CSF leakage.  No active bleeding.  We achieved strict hemostasis.  Next, I turned our attention to the in situ fusion.  I used morselized cancellous bone from the resection and developed a plane between the lateral aspect of the facet and pars bilaterally.  I used a curette to decorticate the lateral aspect of the  facets bilaterally and the spinous process 5 and up to 4.  I then packed bilaterally the autologous bone and then we used the ViviGen with the caulking gun to deliver the allograft bone 3 mL bilaterally into that recess and compressed it.  No bone was  extravasating into the area of the decompression.  The patient had fairly soft bone over the facets as well.  We achieved strict hemostasis.  Next, again copiously irrigated the wound.  Inspection revealed no evidence of CSF leak or active bleeding and  removed the Government social research officer.  I placed two small pieces of thrombin soaked Gelfoam 2 x 2 mm over the L5 nerve root on the right.  After release of McCulloch retractor, there was no extravasation of bone.  Bipolar electrocautery was utilized to  achieve strict hemostasis after copious irrigation and evacuation.  We closed the dorsal lumbar fascia with #1 Vicryl interrupted figure-of-eight suture, subcuticular with 2-0 and skin with staples.  The wound was dressed sterilely placed supine in the  hospital bed, extubated without difficulty, and transported to the recovery room in satisfactory condition.  The patient tolerated the procedure well with no complications.  Assistant Orland Penman, [place] was used throughout the case for patient positioning, general intermittent neural traction and final closure.  Blood loss was 25 mL.  The patient was after sterile dressing was placed in the hospital bed, extubated, and transported to the recovery room in satisfactory condition.  POSTOPERATIVE PLAN:  Utilize postoperative brace when out of bed.   PUS D: 01/20/2023 11:05:26 am T: 01/20/2023 11:51:00 am  JOB: [id number]/ [id number]

## 2023-01-20 NOTE — Plan of Care (Signed)
Pt doing well. Pt and husband given D/C instructions with verbal understanding. Rx's were sent to the pharmacy by MD. Pt's incision is clean and dry with no sign of infection. Pt's IV was removed prior to D/C. Pt D/C'd home via wheelchair per MD order. Pt is stable @ D/C and has no other needs at this time. Shayanna Thatch, RN  

## 2023-01-20 NOTE — Brief Op Note (Signed)
01/20/2023  7:28 AM  PATIENT:  Chelsea Morgan  72 y.o. female  PRE-OPERATIVE DIAGNOSIS:  Stenosis L4-5  POST-OPERATIVE DIAGNOSIS:  * No post-op diagnosis entered *  PROCEDURE:  Procedure(s) with comments: Central decompression L4-5 with lateral mass fusion using autologous and allograft bone (N/A) - 3 C-Bed  SURGEON:  Surgeons and Role:    Jene Every, MD - Primary  PHYSICIAN ASSISTANT:   ASSISTANTS: Bissell   ANESTHESIA:   general  EBL:  25 min   BLOOD ADMINISTERED:none  DRAINS: none   LOCAL MEDICATIONS USED:  MARCAINE     SPECIMEN:  No Specimen  DISPOSITION OF SPECIMEN:  N/A  COUNTS:  YES  TOURNIQUET:  * No tourniquets in log *  DICTATION: .Other Dictation: Dictation Number [id number]  PLAN OF CARE: Admit for overnight observation  PATIENT DISPOSITION:  PACU - hemodynamically stable.   Delay start of Pharmacological VTE agent (>24hrs) due to surgical blood loss or risk of bleeding: yes

## 2023-01-20 NOTE — Evaluation (Signed)
Occupational Therapy Evaluation Patient Details Name: Chelsea Morgan MRN: [id number] DOB: [birth year] Today's Date: 01/20/2023   History of Present Illness 72 yo F s/p Central laminectomy at L4-L5 with bilateral L4 foraminotomies, L5 foraminotomies.  PMH includes: a-flutter, arthritis   Clinical Impression   Patient admitted for the diagnosis above.  PTA she lives at home with her spouse, and remained active despite back pain.  Currently she is very close to her baseline, needing no assist for ADL and mobility in the hall.  Patient has a good understanding of all precautions, and no further OT is needed in the acute setting.  Recommend follow up as prescribed by MD.         If plan is discharge home, recommend the following: Assist for transportation    Functional Status Assessment  Patient has not had a recent decline in their functional status  Equipment Recommendations  None recommended by OT    Recommendations for Other Services       Precautions / Restrictions Precautions Precautions: Back Precaution Booklet Issued: Yes (comment) Required Braces or Orthoses: Spinal Brace Spinal Brace: Lumbar corset Restrictions Weight Bearing Restrictions: No      Mobility Bed Mobility Overal bed mobility: Modified Independent                  Transfers Overall transfer level: Independent                        Balance Overall balance assessment: No apparent balance deficits (not formally assessed)                                         ADL either performed or assessed with clinical judgement   ADL Overall ADL's : At baseline                                             Vision Patient Visual Report: No change from baseline       Perception Perception: Within Functional Limits       Praxis Praxis: WFL       Pertinent Vitals/Pain Pain Assessment Pain Assessment: Faces Faces Pain Scale: Hurts little more Pain  Location: L eye Pain Descriptors / Indicators: Sharp Pain Intervention(s): Monitored during session     Extremity/Trunk Assessment Upper Extremity Assessment Upper Extremity Assessment: Overall WFL for tasks assessed   Lower Extremity Assessment Lower Extremity Assessment: Overall WFL for tasks assessed   Cervical / Trunk Assessment Cervical / Trunk Assessment: Back Surgery   Communication Communication Communication: No apparent difficulties   Cognition Arousal: Alert Behavior During Therapy: WFL for tasks assessed/performed Overall Cognitive Status: Within Functional Limits for tasks assessed                                       General Comments   VSS on RA    Exercises     Shoulder Instructions      Home Living Family/patient expects to be discharged to:: Private residence Living Arrangements: Spouse/significant other Available Help at Discharge: Family;Available 24 hours/day Type of Home: House Home Access: Stairs to enter [employer] of Steps: 2   Home Layout: Multi-level;Able  to live on main level with bedroom/bathroom         Bathroom Toilet: Standard Bathroom Accessibility: Yes How Accessible: Accessible via walker Home Equipment: None          Prior Functioning/Environment Prior Level of Function : Independent/Modified Independent;Driving                        OT Problem List: Pain      OT Treatment/Interventions:      OT Goals(Current goals can be found in the care plan section) Acute Rehab OT Goals Patient Stated Goal: Return home OT Goal Formulation: With patient Time For Goal Achievement: 01/24/23 Potential to Achieve Goals: Good  OT Frequency:      Co-evaluation              AM-PAC OT "6 Clicks" Daily Activity     Outcome Measure Help from another person eating meals?: None Help from another person taking care of personal grooming?: None Help from another person toileting, which  includes using toliet, bedpan, or urinal?: None Help from another person bathing (including washing, rinsing, drying)?: None Help from another person to put on and taking off regular upper body clothing?: None Help from another person to put on and taking off regular lower body clothing?: None 6 Click Score: 24   End of Session Equipment Utilized During Treatment: Back brace Nurse Communication: Mobility status  Activity Tolerance: Patient tolerated treatment well Patient left: in chair;with call bell/phone within reach  OT Visit Diagnosis: Unsteadiness on feet (R26.81)                Time: 1610-9604 OT Time Calculation (min): 28 min Charges:  OT General Charges $OT Visit: 1 Visit OT Evaluation $OT Eval Moderate Complexity: 1 Mod OT Treatments $Self Care/Home Management : 8-22 mins  01/20/2023  RP, OTR/L  Acute Rehabilitation Services  Office:  [phone]   Suzanna Obey 01/20/2023, 5:03 PM

## 2023-01-20 NOTE — Discharge Instructions (Signed)

## 2023-01-20 NOTE — Interval H&P Note (Signed)
History and Physical Interval Note:  01/20/2023 7:27 AM  Chelsea Morgan  has presented today for surgery, with the diagnosis of Stenosis L4-5.  The various methods of treatment have been discussed with the patient and family. After consideration of risks, benefits and other options for treatment, the patient has consented to  Procedure(s) with comments: Central decompression L4-5 with lateral mass fusion using autologous and allograft bone (N/A) - 3 C-Bed as a surgical intervention.  The patient's history has been reviewed, patient examined, no change in status, stable for surgery.  I have reviewed the patient's chart and labs.  Questions were answered to the patient's satisfaction.     Javier Docker

## 2023-01-20 NOTE — Transfer of Care (Signed)
Immediate Anesthesia Transfer of Care Note  Patient: Chelsea Morgan  Procedure(s) Performed: Central Decompression Lumbar Four-Five with Lateral Mass Fusion Using Autologous and Allograft Bone  Patient Location: PACU  Anesthesia Type:General  Level of Consciousness: awake and alert   Airway & Oxygen Therapy: Patient Spontanous Breathing and Patient connected to nasal cannula oxygen  Post-op Assessment: Report given to RN and Post -op Vital signs reviewed and stable  Post vital signs: Reviewed and stable  Last Vitals:  Vitals Value Taken Time  BP 132/92 01/20/23 1103  Temp 36.4 C 01/20/23 1103  Pulse 82 01/20/23 1108  Resp 13 01/20/23 1108  SpO2 94 % 01/20/23 1108  Vitals shown include unfiled device data.  Last Pain:  Vitals:   01/20/23 1103  TempSrc:   PainSc: 0-No pain         Complications: No notable events documented.

## 2023-01-20 NOTE — Progress Notes (Signed)
Orthopedic Tech Progress Note Patient Details:  Chelsea Morgan May 30, 1950 [id number]  Ortho Devices Type of Ortho Device: Lumbar corsett Ortho Device/Splint Location: Back Ortho Device/Splint Interventions: Ordered      Bella Kennedy A Juanjesus Pepperman 01/20/2023, 12:37 PM

## 2023-01-21 ENCOUNTER — Encounter (HOSPITAL_COMMUNITY): Payer: Self-pay | Admitting: Specialist

## 2023-01-21 NOTE — Anesthesia Postprocedure Evaluation (Signed)
Anesthesia Post Note  Patient: Chelsea Morgan  Procedure(s) Performed: Central Decompression Lumbar Four-Five with Lateral Mass Fusion Using Autologous and Allograft Bone     Patient location during evaluation: PACU Anesthesia Type: General Level of consciousness: awake and alert Pain management: pain level controlled Vital Signs Assessment: post-procedure vital signs reviewed and stable Respiratory status: spontaneous breathing, nonlabored ventilation, respiratory function stable and patient connected to nasal cannula oxygen Cardiovascular status: blood pressure returned to baseline and stable Postop Assessment: no apparent nausea or vomiting Anesthetic complications: no   No notable events documented.  Last Vitals:  Vitals:   01/20/23 1211 01/20/23 1703  BP: (!) 156/78 (!) 149/82  Pulse: 67 89  Resp: 16 16  Temp:  36.9 C  SpO2: 99% 100%    Last Pain:  Vitals:   01/20/23 1703  TempSrc: Oral  PainSc:                  [name] S

## 2023-02-02 DIAGNOSIS — Z4889 Encounter for other specified surgical aftercare: Secondary | ICD-10-CM | POA: Diagnosis not present

## 2023-02-21 DIAGNOSIS — J383 Other diseases of vocal cords: Secondary | ICD-10-CM | POA: Diagnosis not present

## 2023-02-21 DIAGNOSIS — E8581 Light chain (AL) amyloidosis: Secondary | ICD-10-CM | POA: Diagnosis not present

## 2023-02-21 DIAGNOSIS — J3800 Paralysis of vocal cords and larynx, unspecified: Secondary | ICD-10-CM | POA: Diagnosis not present

## 2023-02-21 DIAGNOSIS — E859 Amyloidosis, unspecified: Secondary | ICD-10-CM | POA: Diagnosis not present

## 2023-02-21 DIAGNOSIS — R490 Dysphonia: Secondary | ICD-10-CM | POA: Diagnosis not present

## 2023-02-21 DIAGNOSIS — J3801 Paralysis of vocal cords and larynx, unilateral: Secondary | ICD-10-CM | POA: Diagnosis not present

## 2023-02-22 DIAGNOSIS — E8581 Light chain (AL) amyloidosis: Secondary | ICD-10-CM | POA: Diagnosis not present

## 2023-02-22 DIAGNOSIS — E859 Amyloidosis, unspecified: Secondary | ICD-10-CM | POA: Diagnosis not present

## 2023-02-22 DIAGNOSIS — R490 Dysphonia: Secondary | ICD-10-CM | POA: Diagnosis not present

## 2023-02-22 DIAGNOSIS — J383 Other diseases of vocal cords: Secondary | ICD-10-CM | POA: Diagnosis not present

## 2023-02-24 DIAGNOSIS — H02831 Dermatochalasis of right upper eyelid: Secondary | ICD-10-CM | POA: Diagnosis not present

## 2023-02-24 DIAGNOSIS — H02834 Dermatochalasis of left upper eyelid: Secondary | ICD-10-CM | POA: Diagnosis not present

## 2023-02-24 DIAGNOSIS — H57813 Brow ptosis, bilateral: Secondary | ICD-10-CM | POA: Diagnosis not present

## 2023-02-24 DIAGNOSIS — R234 Changes in skin texture: Secondary | ICD-10-CM | POA: Diagnosis not present

## 2023-03-01 DIAGNOSIS — Z4889 Encounter for other specified surgical aftercare: Secondary | ICD-10-CM | POA: Diagnosis not present

## 2023-03-17 DIAGNOSIS — E859 Amyloidosis, unspecified: Secondary | ICD-10-CM | POA: Diagnosis not present

## 2023-03-17 DIAGNOSIS — J3801 Paralysis of vocal cords and larynx, unilateral: Secondary | ICD-10-CM | POA: Diagnosis not present

## 2023-03-17 DIAGNOSIS — R490 Dysphonia: Secondary | ICD-10-CM | POA: Diagnosis not present

## 2023-03-17 DIAGNOSIS — J383 Other diseases of vocal cords: Secondary | ICD-10-CM | POA: Diagnosis not present

## 2023-03-17 DIAGNOSIS — J3800 Paralysis of vocal cords and larynx, unspecified: Secondary | ICD-10-CM | POA: Diagnosis not present

## 2023-03-17 DIAGNOSIS — E8581 Light chain (AL) amyloidosis: Secondary | ICD-10-CM | POA: Diagnosis not present

## 2023-03-28 ENCOUNTER — Ambulatory Visit (INDEPENDENT_AMBULATORY_CARE_PROVIDER_SITE_OTHER): Payer: Medicare HMO | Admitting: Family Medicine

## 2023-03-28 ENCOUNTER — Encounter: Payer: Self-pay | Admitting: Family Medicine

## 2023-03-28 VITALS — BP 112/68 | HR 69 | Temp 98.1°F | Ht 61.5 in | Wt 130.4 lb

## 2023-03-28 DIAGNOSIS — M8580 Other specified disorders of bone density and structure, unspecified site: Secondary | ICD-10-CM | POA: Diagnosis not present

## 2023-03-28 DIAGNOSIS — Z0000 Encounter for general adult medical examination without abnormal findings: Secondary | ICD-10-CM

## 2023-03-28 DIAGNOSIS — I10 Essential (primary) hypertension: Secondary | ICD-10-CM | POA: Diagnosis not present

## 2023-03-28 LAB — BASIC METABOLIC PANEL
BUN: 13 mg/dL (ref 6–23)
CO2: 30 meq/L (ref 19–32)
Calcium: 9.5 mg/dL (ref 8.4–10.5)
Chloride: 101 meq/L (ref 96–112)
Creatinine, Ser: 0.6 mg/dL (ref 0.40–1.20)
GFR: 88.35 mL/min (ref >60.00)
Glucose, Bld: 96 mg/dL (ref 70–99)
Potassium: 4.1 meq/L (ref 3.5–5.1)
Sodium: 139 meq/L (ref 135–145)

## 2023-03-28 LAB — LIPID PANEL
Cholesterol: 171 mg/dL (ref 0–200)
HDL: 58.7 mg/dL (ref >39.00)
LDL Cholesterol: 101 mg/dL — ABNORMAL HIGH (ref 0–99)
NonHDL: 111.92
Total CHOL/HDL Ratio: 3
Triglycerides: 57 mg/dL (ref 0.0–149.0)
VLDL: 11.4 mg/dL (ref 0.0–40.0)

## 2023-03-28 LAB — HEPATIC FUNCTION PANEL
ALT: 20 U/L (ref 0–35)
AST: 18 U/L (ref 0–37)
Albumin: 4.1 g/dL (ref 3.5–5.2)
Alkaline Phosphatase: 79 U/L (ref 39–117)
Bilirubin, Direct: 0.2 mg/dL (ref 0.0–0.3)
Total Bilirubin: 1.4 mg/dL — ABNORMAL HIGH (ref 0.2–1.2)
Total Protein: 6.5 g/dL (ref 6.0–8.3)

## 2023-03-28 LAB — CBC WITH DIFFERENTIAL/PLATELET
Basophils Absolute: 0 10*3/uL (ref 0.0–0.1)
Basophils Relative: 0.6 % (ref 0.0–3.0)
Eosinophils Absolute: 0.2 10*3/uL (ref 0.0–0.7)
Eosinophils Relative: 5.9 % — ABNORMAL HIGH (ref 0.0–5.0)
HCT: 40.4 % (ref 36.0–46.0)
Hemoglobin: 13.4 g/dL (ref 12.0–15.0)
Lymphocytes Relative: 26.2 % (ref 12.0–46.0)
Lymphs Abs: 1 10*3/uL (ref 0.7–4.0)
MCHC: 33.2 g/dL (ref 30.0–36.0)
MCV: 90.8 fL (ref 78.0–100.0)
Monocytes Absolute: 0.4 10*3/uL (ref 0.1–1.0)
Monocytes Relative: 9.9 % (ref 3.0–12.0)
Neutro Abs: 2.2 10*3/uL (ref 1.4–7.7)
Neutrophils Relative %: 57.4 % (ref 43.0–77.0)
Platelets: 257 10*3/uL (ref 150.0–400.0)
RBC: 4.45 Mil/uL (ref 3.87–5.11)
RDW: 13.6 % (ref 11.5–15.5)
WBC: 3.9 10*3/uL — ABNORMAL LOW (ref 4.0–10.5)

## 2023-03-28 LAB — TSH: TSH: 2.15 u[IU]/mL (ref 0.35–5.50)

## 2023-03-28 LAB — VITAMIN D 25 HYDROXY (VIT D DEFICIENCY, FRACTURES): VITD: 46.83 ng/mL (ref 30.00–100.00)

## 2023-03-28 NOTE — Patient Instructions (Addendum)
Follow up in 6 months to recheck blood pressure We'll notify you of your lab results and make any changes if needed Continue to work on healthy diet and regular exercise- you look great!! Call with any questions or concerns Stay Safe!  Stay Healthy! Happy New Year!!

## 2023-03-28 NOTE — Assessment & Plan Note (Signed)
Pt's PE WNL.  UTD on mammo, colonoscopy, flu, Tdap, PNA.  Check labs.  Anticipatory guidance provided.

## 2023-03-28 NOTE — Progress Notes (Signed)
   Subjective:    Patient ID: Chelsea Morgan, female    DOB: 1950-12-30, 73 y.o.   MRN: [id number]  HPI CPE- UTD on mammo, flu, Tdap, colonoscopy, PNA.  Patient Care Team    Relationship Specialty Notifications Start End  Sheliah Hatch, MD PCP - General Family Medicine  09/15/16   Marinus Maw, MD PCP - Electrophysiology Cardiology  03/26/22   Coralie Carpen, MD Consulting Physician Family Medicine  01/29/19   Bradly Bienenstock, MD Consulting Physician Orthopedic Surgery  01/29/19   Marlow Baars, MD Consulting Physician Obstetrics  01/29/19     Health Maintenance  Topic Date Due  . INFLUENZA VACCINE  10/07/2022  . MAMMOGRAM  11/14/2022  . Medicare Annual Wellness (AWV)  07/28/2023  . DTaP/Tdap/Td (2 - Td or Tdap) 08/08/2023  . Colonoscopy  11/27/2024  . Pneumonia Vaccine 18+ Years old  Completed  . DEXA SCAN  Completed  . Hepatitis C Screening  Completed  . Zoster Vaccines- Shingrix  Completed  . HPV VACCINES  Aged Out  . COVID-19 Vaccine  Discontinued      Review of Systems Patient reports no vision/ hearing changes, adenopathy,fever, weight change, swallowing issues, chest pain, palpitations, edema, persistant/recurrent cough, hemoptysis, dyspnea (rest/exertional/paroxysmal nocturnal), gastrointestinal bleeding (melena, rectal bleeding), abdominal pain, significant heartburn, bowel changes, GU symptoms (dysuria, hematuria, incontinence), Gyn symptoms (abnormal  bleeding, pain),  syncope, focal weakness, memory loss, skin/hair/nail changes, abnormal bruising or bleeding, anxiety, or depression.   + numbness of R foot due to ongoing back issues + hoarseness- due to amyloid    Objective:   Physical Exam General Appearance:    Alert, cooperative, no distress, appears stated age  Head:    Normocephalic, without obvious abnormality, atraumatic  Eyes:    PERRL, conjunctiva/corneas clear, EOM's intact both eyes  Ears:    Normal TM's and external ear canals, both ears  Nose:    Nares normal, septum midline, mucosa normal, no drainage    or sinus tenderness  Throat:   Lips, mucosa, and tongue normal; teeth and gums normal  Neck:   Supple, symmetrical, trachea midline, no adenopathy;    Thyroid: no enlargement/tenderness/nodules  Back:     Symmetric, no curvature, ROM normal, no CVA tenderness  Lungs:     Clear to auscultation bilaterally, respirations unlabored  Chest Wall:    No tenderness or deformity   Heart:    Regular rate and rhythm, S1 and S2 normal, no murmur, rub   or gallop  Breast Exam:    Deferred to mammo  Abdomen:     Soft, non-tender, bowel sounds active all four quadrants,    no masses, no organomegaly  Genitalia:    Deferred  Rectal:    Extremities:   Extremities normal, atraumatic, no cyanosis or edema  Pulses:   2+ and symmetric all extremities  Skin:   Skin color, texture, turgor normal, no rashes or lesions  Lymph nodes:   Cervical, supraclavicular, and axillary nodes normal  Neurologic:   CNII-XII intact, normal strength, sensation and reflexes    throughout          Assessment & Plan:

## 2023-03-29 ENCOUNTER — Telehealth: Payer: Self-pay

## 2023-03-29 ENCOUNTER — Encounter: Payer: Self-pay | Admitting: Family Medicine

## 2023-03-29 NOTE — Telephone Encounter (Signed)
-----   Message from Neena Rhymes sent at 03/29/2023  7:43 AM EST ----- Labs look great!  No changes at this time

## 2023-03-29 NOTE — Telephone Encounter (Signed)
Patient is aware of her labs and has no questions at this time.

## 2023-03-31 ENCOUNTER — Encounter: Payer: Self-pay | Admitting: Family Medicine

## 2023-04-04 ENCOUNTER — Ambulatory Visit: Payer: Medicare HMO | Attending: Internal Medicine | Admitting: Internal Medicine

## 2023-04-04 ENCOUNTER — Encounter: Payer: Self-pay | Admitting: Internal Medicine

## 2023-04-04 VITALS — BP 142/68 | HR 83 | Ht 61.5 in | Wt 131.4 lb

## 2023-04-04 DIAGNOSIS — I480 Paroxysmal atrial fibrillation: Secondary | ICD-10-CM | POA: Diagnosis not present

## 2023-04-04 NOTE — Progress Notes (Signed)
HPI Chelsea Morgan (pronounced Chelsea Morgan) returns today for followup. She is a pleasant 73 yo woman with a h/o atrial flutter who underwent catheter ablation years ago and then developed atrial fib for which she was minimally symptomatic. When I saw her I recommended Xarelto (CHADSVASC 3), but she has declined. She does not have palpitations and her atrial fib is controlled. She has had problems with uncontrolled falls.  Allergies  Allergen Reactions   Bee Venom Swelling   Sulfa Antibiotics Swelling    Hospitalized x 3 days 20001     Current Outpatient Medications  Medication Sig Dispense Refill   atorvastatin (LIPITOR) 10 MG tablet Take 1 tablet (10 mg total) by mouth daily. 90 tablet 3   b complex vitamins capsule Take 1 capsule by mouth daily.     BIOTIN 5000 PO Take 1 tablet by mouth daily.      carvedilol (COREG) 12.5 MG tablet Take 1/2 (one-half) tablet by mouth twice daily (Patient taking differently: Take 6.25 mg by mouth in the morning and at bedtime.) 90 tablet 3   Coenzyme Q10 (COQ10) 100 MG CAPS Take 100 mg by mouth every evening.     docusate sodium (COLACE) 100 MG capsule Take 1 capsule (100 mg total) by mouth 2 (two) times daily. 60 capsule 2   EPINEPHrine (EPI-PEN) 0.3 mg/0.3 mL DEVI Inject 0.3 mg into the muscle as needed (alleric reaction).     finasteride (PROSCAR) 5 MG tablet Take 1/2 (one-half) tablet by mouth once daily 45 tablet 3   hydrochlorothiazide (HYDRODIURIL) 25 MG tablet Take 1 tablet (25 mg total) by mouth daily. 90 tablet 3   hydrocortisone 2.5 % ointment Apply 1 Application topically as needed (itching).     levocetirizine (XYZAL) 5 MG tablet Take 2.5-5 mg by mouth as needed for allergies.     Magnesium 250 MG TABS Take 250 mg by mouth at bedtime.     methocarbamol (ROBAXIN) 500 MG tablet Take 1 tablet (500 mg total) by mouth every 8 (eight) hours as needed for muscle spasms. 30 tablet 1   Omega-3 Fatty Acids (FISH OIL) 1360 MG CAPS Take 1,360 mg by mouth at  bedtime.     polyethylene glycol (MIRALAX / GLYCOLAX) 17 g packet Take 17 g by mouth daily. 14 each 0   POTASSIUM PO Take 1-1.5 capsules by mouth daily.     No current facility-administered medications for this visit.     Past Medical History:  Diagnosis Date   ALLERGIC RHINITIS 01/12/2008   Amyloidosis of larynx (HCC)    s/p Coblator excision of false vocal fold lesion 07/12/19, pathology "AL amyloid, Kappa" most compatible tracheobronchial amyloidosis   Arthritis    back   Atrial flutter (HCC)    Blood transfusion without reported diagnosis 03/09/1979   after ectopic pregnancy   CAROTID BRUITS, BILATERAL 04/01/2009   Dysrhythmia    Essential hypertension, benign 01/12/2008   HYPERLIPIDEMIA 05/02/2007   Rheumatic fever     ROS:   All systems reviewed and negative except as noted in the HPI.   Past Surgical History:  Procedure Laterality Date   ABLATION  12/28/2013   atrial flutter ablation by Dr Ladona Ridgel   ATRIAL FLUTTER ABLATION N/A 12/28/2013   Procedure: ATRIAL FLUTTER ABLATION;  Surgeon: Marinus Maw, MD;  Location: [facility] CATH LAB;  Service: Cardiovascular;  Laterality: N/A;   CARPAL TUNNEL RELEASE Right 03/08/2005   CATARACT EXTRACTION Bilateral 2022   EYE SURGERY  LUMBAR LAMINECTOMY/DECOMPRESSION MICRODISCECTOMY N/A 01/20/2023   Procedure: Central Decompression Lumbar Four-Five with Lateral Mass Fusion Using Autologous and Allograft Bone;  Surgeon: Jene Every, MD;  Location: [facility] OR;  Service: Orthopedics;  Laterality: N/A;  3 C-Bed   SALPINGECTOMY  03/09/1979     Family History  Problem Relation Age of Onset   Cancer Mother        stomach   Esophageal cancer Mother 52       per pt esophageal ca [facility]   Stroke Father    Cancer Paternal Aunt        colon   Colon cancer Paternal Aunt 66   Rectal cancer Neg Hx    Stomach cancer Neg Hx      Social History   Socioeconomic History   Marital status: Married    Spouse name: Not on file   Number  of children: Not on file   Years of education: Not on file   Highest education level: Not on file  Occupational History   Not on file  Tobacco Use   Smoking status: Never   Smokeless tobacco: Never  Substance and Sexual Activity   Alcohol use: Yes    Alcohol/week: 0.0 standard drinks of alcohol    Comment: occasionally   Drug use: Not Currently    Comment: rare   Sexual activity: Not Currently  Other Topics Concern   Not on file  Social History Narrative   Not on file   Social Drivers of Health   Financial Resource Strain: Low Risk  (07/28/2022)   Overall Financial Resource Strain (CARDIA)    Difficulty of Paying Living Expenses: Not hard at all  Food Insecurity: No Food Insecurity (07/28/2022)   Hunger Vital Sign    Worried About Running Out of Food in the Last Year: Never true    Ran Out of Food in the Last Year: Never true  Transportation Needs: No Transportation Needs (07/28/2022)   PRAPARE - Administrator, Civil Service (Medical): No    Lack of Transportation (Non-Medical): No  Physical Activity: Insufficiently Active (07/28/2022)   Exercise Vital Sign    Days of Exercise per Week: 4 days    Minutes of Exercise per Session: 30 min  Stress: No Stress Concern Present (07/28/2022)   [employer] of Occupational Health - Occupational Stress Questionnaire    Feeling of Stress : Only a little  Social Connections: Moderately Integrated (07/28/2022)   Social Connection and Isolation Panel [NHANES]    Frequency of Communication with Friends and Family: More than three times a week    Frequency of Social Gatherings with Friends and Family: Once a week    Attends Religious Services: More than 4 times per year    Active Member of [employer] or Organizations: No    Attends Banker Meetings: Never    Marital Status: Married  Catering manager Violence: Not At Risk (07/28/2022)   Humiliation, Afraid, Rape, and Kick questionnaire    Fear of Current or  Ex-Partner: No    Emotionally Abused: No    Physically Abused: No    Sexually Abused: No     BP (!) 142/68   Pulse 83   Ht 5' 1.5" (1.562 m)   Wt 131 lb 6.4 oz (59.6 kg)   SpO2 97%   BMI 24.43 kg/m   Physical Exam:  Well appearing NAD HEENT: Unremarkable Neck:  No JVD, no thyromegally Lymphatics:  No adenopathy Back:  No CVA  tenderness Lungs:  Clear HEART:  Regular rate rhythm, no murmurs, no rubs, no clicks Abd:  soft, positive bowel sounds, no organomegally, no rebound, no guarding Ext:  2 plus pulses, no edema, no cyanosis, no clubbing Skin:  No rashes no nodules Neuro:  CN II through XII intact, motor grossly intact  EKG - afib with a controlled VR   Assess/Plan:  1. Atrial fib - her rates are controlled. No change in her meds. I will refer for Watchman. She has falls.  2. Coags - she has refused long term anti-coag but would be willing to consider short term in the setting of the Watchman. 3. 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 She has had a 2D echo which was reassuring. She states that the amyloid has gone. 4. Atrial flutter - she is s/p ablation remotely and has had no recurrent flutter.   Chelsea Gowda Estelita Iten,MD

## 2023-04-04 NOTE — Patient Instructions (Addendum)
Medication Instructions:  Your physician recommends that you continue on your current medications as directed. Please refer to the Current Medication list given to you today.  *If you need a refill on your cardiac medications before your next appointment, please call your pharmacy*  Lab Work: None ordered.  If you have labs (blood work) drawn today and your tests are completely normal, you will receive your results only by: MyChart Message (if you have MyChart) OR A paper copy in the mail If you have any lab test that is abnormal or we need to change your treatment, we will call you to review the results.  Testing/Procedures: None ordered.  Follow-Up: At [facility], you and your health needs are our priority.  As part of our continuing mission to provide you with exceptional heart care, we have created designated Provider Care Teams.  These Care Teams include your primary Cardiologist (physician) and Advanced Practice Providers (APPs -  Physician Assistants and Nurse Practitioners) who all work together to provide you with the care you need, when you need it.  Your next appointment:   To be scheduled.  Referral to Dr Jimmey Ralph for watchmen consult  The format for your next appointment:   In Person  Provider:   Dr Jimmey Ralph    Important Information About Sugar

## 2023-05-02 ENCOUNTER — Other Ambulatory Visit: Payer: Self-pay | Admitting: Family Medicine

## 2023-05-02 DIAGNOSIS — I4892 Unspecified atrial flutter: Secondary | ICD-10-CM

## 2023-05-02 DIAGNOSIS — I10 Essential (primary) hypertension: Secondary | ICD-10-CM

## 2023-05-02 DIAGNOSIS — L659 Nonscarring hair loss, unspecified: Secondary | ICD-10-CM

## 2023-05-03 ENCOUNTER — Other Ambulatory Visit: Payer: Self-pay | Admitting: Family Medicine

## 2023-05-03 ENCOUNTER — Other Ambulatory Visit: Payer: Self-pay

## 2023-05-03 DIAGNOSIS — L659 Nonscarring hair loss, unspecified: Secondary | ICD-10-CM

## 2023-05-03 DIAGNOSIS — I4892 Unspecified atrial flutter: Secondary | ICD-10-CM

## 2023-05-03 DIAGNOSIS — I10 Essential (primary) hypertension: Secondary | ICD-10-CM

## 2023-05-09 DIAGNOSIS — M25561 Pain in right knee: Secondary | ICD-10-CM | POA: Diagnosis not present

## 2023-05-09 DIAGNOSIS — M5451 Vertebrogenic low back pain: Secondary | ICD-10-CM | POA: Diagnosis not present

## 2023-06-05 NOTE — Progress Notes (Unsigned)
 Electrophysiology Office Note:   Date:  06/06/2023  ID:  Chelsea Morgan, DOB 12-17-1950, MRN [id number]  Primary Cardiologist: None Electrophysiologist: Lewayne Bunting, MD      History of Present Illness:   Chelsea Morgan is a 73 y.o. female with h/o atrial flutter status post catheter ablation, persistent atrial fibrillation, amyloidosis of larynx, hypertension who is being seen today for evaluation for Watchman device implant at the request of Dr. Ladona Ridgel.  Discussed the use of AI scribe software for clinical note transcription with the patient, who gave verbal consent to proceed.  History of Present Illness She has a history of atrial fibrillation and underwent an ablation for atrial flutter several years ago. She experiences atrial fibrillation pretty much all of the time.  She has not been taking oral anti-coagulation. She describes herself as clumsy with falls. She is inquiring about the Watchman procedure today. She is concerned about having something  permanently placed in her heart. Her current medications include vitamin E, fish oil, and aspirin, which she believes help thin her blood. She inquires about the effectiveness of these supplements in comparison to prescribed blood thinners. She is active, working in the yard and aiming for 10,000 steps a day. No new or acute complaints today.  Review of systems complete and found to be negative unless listed in HPI.   EP Information / Studies Reviewed:    EKG is not ordered today. EKG from 04/04/23 reviewed which showed AF      Echo 11/05/2012:  - Left ventricle: The cavity size was normal. Wall thickness was    normal. Systolic function was normal. The estimated ejection    fraction was in the range of 60% to 65%. Wall motion was normal;    there were no regional wall motion abnormalities.   Risk Assessment/Calculations:    CHA2DS2-VASc Score = 3   This indicates a 3.2% annual risk of stroke. The patient's score is based upon: CHF  History: 0 HTN History: 1 Diabetes History: 0 Stroke History: 0 Vascular Disease History: 0 Age Score: 1 Gender Score: 1             Physical Exam:   VS:  BP 130/82   Pulse (!) 59   Ht 5' 1.5" (1.562 m)   Wt 132 lb 12.8 oz (60.2 kg)   SpO2 97%   BMI 24.69 kg/m    Wt Readings from Last 3 Encounters:  06/06/23 132 lb 12.8 oz (60.2 kg)  04/04/23 131 lb 6.4 oz (59.6 kg)  03/28/23 130 lb 6 oz (59.1 kg)     GEN: Well nourished, well developed in no acute distress NECK: No JVD CARDIAC: Normal rate, irregular rhythm. RESPIRATORY:  Clear to auscultation without rales, wheezing or rhonchi  ABDOMEN: Soft, non-distended EXTREMITIES:  No edema; No deformity   ASSESSMENT AND PLAN:   I have seen Chelsea Morgan in the office today who is being considered for a Watchman left atrial appendage closure device. I believe they will benefit from this procedure given their history of atrial fibrillation, CHA2DS2-VASc score of 3 and unadjusted ischemic stroke rate of 3.2% per year. Unfortunately, the patient is not felt to be a long term anticoagulation candidate secondary to falls and patient preference. The patient's chart has been reviewed and I feel that they would be a candidate for short term oral anticoagulation after Watchman implant.   It is my belief that after undergoing a LAA closure procedure, Chelsea Morgan will not need long  term anticoagulation which eliminates anticoagulation side effects and major bleeding risk.   Procedural risks for the Watchman implant have been reviewed with the patient including a 0.5% risk of stroke, <1% risk of perforation and <1% risk of device embolization. Other risks include bleeding, vascular damage, tamponade, worsening renal function, and death. The patient understands these risk and wishes to proceed.     The published clinical data on the safety and effectiveness of WATCHMAN include but are not limited to the following: - Holmes DR, Everlene Farrier, Sick P  et al. for the PROTECT AF Investigators. Percutaneous closure of the left atrial appendage versus warfarin therapy for prevention of stroke in patients with atrial fibrillation: a randomised non-inferiority trial. Lancet 2009; 374: 534-42. Everlene Farrier, Doshi SK, Isa Rankin D et al. on behalf of the PROTECT AF Investigators. Percutaneous Left Atrial Appendage Closure for Stroke Prophylaxis in Patients With Atrial Fibrillation 2.3-Year Follow-up of the PROTECT AF (Watchman Left Atrial Appendage System for Embolic Protection in Patients With Atrial Fibrillation) Trial. Circulation 2013; 127:720-729. - Alli O, Doshi S,  Kar S, Reddy VY, Sievert H et al. Quality of Life Assessment in the Randomized PROTECT AF (Percutaneous Closure of the Left Atrial Appendage Versus Warfarin Therapy for Prevention of Stroke in Patients With Atrial Fibrillation) Trial of Patients at Risk for Stroke With Nonvalvular Atrial Fibrillation. J Am Coll Cardiol 2013; 61:1790-8. Aline August DR, Mia [place], Price M, Whisenant B, Sievert H, Doshi S, Huber K, Reddy V. Prospective randomized evaluation of the Watchman left atrial appendage Device in patients with atrial fibrillation versus long-term warfarin therapy; the PREVAIL trial. Journal of the [employer] of Cardiology, Vol. 4, No. 1, 2014, 1-11. - Kar S, Doshi SK, Sadhu A, Horton R, Osorio J et al. Primary outcome evaluation of a next-generation left atrial appendage closure device: results from the PINNACLE FLX trial. Circulation 2021;143(18)1754-1762.    After today's visit with the patient which was dedicated solely for shared decision making visit regarding LAA closure device, the patient would like to think about her options.  If she chooses to proceed with watchman implant, then I would like to obtain a gated CT scan of the chest with contrast timed for PV/LA visualization. Additionally, the patient would also need an updated echocardiogram.  HAS-BLED score 2 Hypertension  Yes  Abnormal renal and liver function (Dialysis, transplant, Cr >2.26 mg/dL /Cirrhosis or Bilirubin >2x Normal or AST/ALT/AP >3x Normal) No  Stroke No  Bleeding No  Labile INR (Unstable/high INR) No  Elderly (>65) Yes  Drugs or alcohol (>= 8 drinks/week, anti-plt or NSAID) No   CHA2DS2-VASc Score = 3  The patient's score is based upon: CHF History: 0 HTN History: 1 Diabetes History: 0 Stroke History: 0 Vascular Disease History: 0 Age Score: 1 Gender Score: 1       ASSESSMENT AND PLAN: Permanent Atrial Fibrillation  Typical atrial flutter status post ablation -She appears to be asymptomatic from this.  She reports no limitations with her activity. -Continue rate control with carvedilol 12.5 mg twice daily.  Secondary hypercoagulable state due to atrial fibrillation/flutter: The patient's CHA2DS2-VASc score is 3, indicating a 3.2% annual risk of stroke.   -Extensive risk and benefits discussions regarding anticoagulation and stroke prophylaxis has been conducted today and as well as with prior visits.  Patient voices understanding but is hesitant to take oral anticoagulation.  If she decides to pursue Watchman device implant then we will start Eliquis 5 mg twice daily 4 weeks  prior to the procedure and continue for at least 45 days after.  Hypertension -At goal today.  Recommend checking blood pressures 1-2 times per week at home and recording the values.  Recommend bringing these recordings to the primary care physician.   Signed, Nobie Putnam, MD

## 2023-06-06 ENCOUNTER — Encounter: Payer: Self-pay | Admitting: Cardiology

## 2023-06-06 ENCOUNTER — Ambulatory Visit: Payer: Medicare HMO | Attending: Cardiology | Admitting: Cardiology

## 2023-06-06 VITALS — BP 130/82 | HR 59 | Ht 61.5 in | Wt 132.8 lb

## 2023-06-06 DIAGNOSIS — I4819 Other persistent atrial fibrillation: Secondary | ICD-10-CM | POA: Diagnosis not present

## 2023-06-06 DIAGNOSIS — I10 Essential (primary) hypertension: Secondary | ICD-10-CM | POA: Diagnosis not present

## 2023-06-06 DIAGNOSIS — I483 Typical atrial flutter: Secondary | ICD-10-CM

## 2023-06-06 DIAGNOSIS — E854 Organ-limited amyloidosis: Secondary | ICD-10-CM

## 2023-06-06 DIAGNOSIS — D6869 Other thrombophilia: Secondary | ICD-10-CM | POA: Diagnosis not present

## 2023-06-06 NOTE — Patient Instructions (Signed)
 Medication Instructions:  Your physician recommends that you continue on your current medications as directed. Please refer to the Current Medication list given to you today.  *If you need a refill on your cardiac medications before your next appointment, please call your pharmacy*   Testing/Procedures: Watchman  Your physician has requested that you have Left atrial appendage (LAA) closure device implantation is a procedure to put a small device in the LAA of the heart. The LAA is a small sac in the wall of the heart's left upper chamber. Blood clots can form in this area. The device, Watchman closes the LAA to help prevent a blood clot and stroke.  Please contact Nurse Navigator, Karsten Fells if you would like to schedule at [phone].    Follow-Up: At [facility], you and your health needs are our priority.  As part of our continuing mission to provide you with exceptional heart care, our providers are all part of one team.  This team includes your primary Cardiologist (physician) and Advanced Practice Providers or APPs (Physician Assistants and Nurse Practitioners) who all work together to provide you with the care you need, when you need it.  Your next appointment:   3 months  Provider:   Nobie Putnam, MD         1st Floor: - Lobby - Registration  - Pharmacy  - Lab - Cafe  2nd Floor: - PV Lab - Diagnostic Testing (echo, CT, nuclear med)  3rd Floor: - Vacant  4th Floor: - TCTS (cardiothoracic surgery) - AFib Clinic - Structural Heart Clinic - Vascular Surgery  - Vascular Ultrasound  5th Floor: - HeartCare Cardiology (general and EP) - Clinical Pharmacy for coumadin, hypertension, lipid, weight-loss medications, and med management appointments    Valet parking services will be available as well.

## 2023-07-09 DIAGNOSIS — I10 Essential (primary) hypertension: Secondary | ICD-10-CM | POA: Diagnosis not present

## 2023-07-09 DIAGNOSIS — Z882 Allergy status to sulfonamides status: Secondary | ICD-10-CM | POA: Diagnosis not present

## 2023-07-09 DIAGNOSIS — Z87891 Personal history of nicotine dependence: Secondary | ICD-10-CM | POA: Diagnosis not present

## 2023-07-09 DIAGNOSIS — I4891 Unspecified atrial fibrillation: Secondary | ICD-10-CM | POA: Diagnosis not present

## 2023-07-09 DIAGNOSIS — R58 Hemorrhage, not elsewhere classified: Secondary | ICD-10-CM | POA: Diagnosis not present

## 2023-07-09 DIAGNOSIS — R234 Changes in skin texture: Secondary | ICD-10-CM | POA: Diagnosis not present

## 2023-07-09 DIAGNOSIS — Z79899 Other long term (current) drug therapy: Secondary | ICD-10-CM | POA: Diagnosis not present

## 2023-07-09 DIAGNOSIS — M79604 Pain in right leg: Secondary | ICD-10-CM | POA: Diagnosis not present

## 2023-07-13 DIAGNOSIS — I83893 Varicose veins of bilateral lower extremities with other complications: Secondary | ICD-10-CM | POA: Diagnosis not present

## 2023-07-20 DIAGNOSIS — I83893 Varicose veins of bilateral lower extremities with other complications: Secondary | ICD-10-CM | POA: Diagnosis not present

## 2023-07-20 DIAGNOSIS — R600 Localized edema: Secondary | ICD-10-CM | POA: Diagnosis not present

## 2023-07-20 DIAGNOSIS — I83813 Varicose veins of bilateral lower extremities with pain: Secondary | ICD-10-CM | POA: Diagnosis not present

## 2023-07-20 DIAGNOSIS — I10 Essential (primary) hypertension: Secondary | ICD-10-CM | POA: Diagnosis not present

## 2023-07-28 ENCOUNTER — Other Ambulatory Visit: Payer: Self-pay | Admitting: Family Medicine

## 2023-07-28 DIAGNOSIS — I4892 Unspecified atrial flutter: Secondary | ICD-10-CM

## 2023-07-28 DIAGNOSIS — I10 Essential (primary) hypertension: Secondary | ICD-10-CM

## 2023-07-28 DIAGNOSIS — L659 Nonscarring hair loss, unspecified: Secondary | ICD-10-CM

## 2023-08-02 DIAGNOSIS — M545 Low back pain, unspecified: Secondary | ICD-10-CM | POA: Diagnosis not present

## 2023-08-02 DIAGNOSIS — M25561 Pain in right knee: Secondary | ICD-10-CM | POA: Diagnosis not present

## 2023-08-24 DIAGNOSIS — M7989 Other specified soft tissue disorders: Secondary | ICD-10-CM | POA: Diagnosis not present

## 2023-08-24 DIAGNOSIS — I83812 Varicose veins of left lower extremities with pain: Secondary | ICD-10-CM | POA: Diagnosis not present

## 2023-09-06 DIAGNOSIS — I87391 Chronic venous hypertension (idiopathic) with other complications of right lower extremity: Secondary | ICD-10-CM | POA: Diagnosis not present

## 2023-09-13 ENCOUNTER — Ambulatory Visit (INDEPENDENT_AMBULATORY_CARE_PROVIDER_SITE_OTHER): Admitting: *Deleted

## 2023-09-13 VITALS — Ht 62.0 in | Wt 132.0 lb

## 2023-09-13 DIAGNOSIS — Z13820 Encounter for screening for osteoporosis: Secondary | ICD-10-CM

## 2023-09-13 DIAGNOSIS — Z0000 Encounter for general adult medical examination without abnormal findings: Secondary | ICD-10-CM | POA: Diagnosis not present

## 2023-09-13 NOTE — Patient Instructions (Signed)
 [name] , Thank you for taking time to come for your Medicare Wellness Visit. I appreciate your ongoing commitment to your health goals. Please review the following plan we discussed and let me know if I can assist you in the future.   Screening recommendations/referrals: Colonoscopy: no longer required Mammogram: up to date Bone Density: ordered Recommended yearly ophthalmology/optometry visit for glaucoma screening and checkup Recommended yearly dental visit for hygiene and checkup  Vaccinations: Influenza vaccine: up to date Pneumococcal vaccine: up to date Tdap vaccine: Education provided Shingles vaccine: up to date    Preventive Care 65 Years and Older, Female Preventive care refers to lifestyle choices and visits with your health care provider that can promote health and wellness. What does preventive care include? A yearly physical exam. This is also called an annual well check. Dental exams once or twice a year. Routine eye exams. Ask your health care provider how often you should have your eyes checked. Personal lifestyle choices, including: Daily care of your teeth and gums. Regular physical activity. Eating a healthy diet. Avoiding tobacco and drug use. Limiting alcohol use. Practicing safe sex. Taking low-dose aspirin every day. Taking vitamin and mineral supplements as recommended by your health care provider. What happens during an annual well check? The services and screenings done by your health care provider during your annual well check will depend on your age, overall health, lifestyle risk factors, and family history of disease. Counseling  Your health care provider may ask you questions about your: Alcohol use. Tobacco use. Drug use. Emotional well-being. Home and relationship well-being. Sexual activity. Eating habits. History of falls. Memory and ability to understand (cognition). Work and work Astronomer. Reproductive health. Screening  You  may have the following tests or measurements: Height, weight, and BMI. Blood pressure. Lipid and cholesterol levels. These may be checked every 5 years, or more frequently if you are over 24 years old. Skin check. Lung cancer screening. You may have this screening every year starting at age 6 if you have a 30-pack-year history of smoking and currently smoke or have quit within the past 15 years. Fecal occult blood test (FOBT) of the stool. You may have this test every year starting at age 57. Flexible sigmoidoscopy or colonoscopy. You may have a sigmoidoscopy every 5 years or a colonoscopy every 10 years starting at age 28. Hepatitis C blood test. Hepatitis B blood test. Sexually transmitted disease (STD) testing. Diabetes screening. This is done by checking your blood sugar (glucose) after you have not eaten for a while (fasting). You may have this done every 1-3 years. Bone density scan. This is done to screen for osteoporosis. You may have this done starting at age 69. Mammogram. This may be done every 1-2 years. Talk to your health care provider about how often you should have regular mammograms. Talk with your health care provider about your test results, treatment options, and if necessary, the need for more tests. Vaccines  Your health care provider may recommend certain vaccines, such as: Influenza vaccine. This is recommended every year. Tetanus, diphtheria, and acellular pertussis (Tdap, Td) vaccine. You may need a Td booster every 10 years. Zoster vaccine. You may need this after age 62. Pneumococcal 13-valent conjugate (PCV13) vaccine. One dose is recommended after age 7. Pneumococcal polysaccharide (PPSV23) vaccine. One dose is recommended after age 18. Talk to your health care provider about which screenings and vaccines you need and how often you need them. This information is not intended to  replace advice given to you by your health care provider. Make sure you discuss any  questions you have with your health care provider. Document Released: 03/21/2015 Document Revised: 11/12/2015 Document Reviewed: 12/24/2014 Elsevier Interactive Patient Education  2017 [employer].  Fall Prevention in the Home Falls can cause injuries. They can happen to people of all ages. There are many things you can do to make your home safe and to help prevent falls. What can I do on the outside of my home? Regularly fix the edges of walkways and driveways and fix any cracks. Remove anything that might make you trip as you walk through a door, such as a raised step or threshold. Trim any bushes or trees on the path to your home. Use bright outdoor lighting. Clear any walking paths of anything that might make someone trip, such as rocks or tools. Regularly check to see if handrails are loose or broken. Make sure that both sides of any steps have handrails. Any raised decks and porches should have guardrails on the edges. Have any leaves, snow, or ice cleared regularly. Use sand or salt on walking paths during winter. Clean up any spills in your garage right away. This includes oil or grease spills. What can I do in the bathroom? Use night lights. Install grab bars by the toilet and in the tub and shower. Do not use towel bars as grab bars. Use non-skid mats or decals in the tub or shower. If you need to sit down in the shower, use a plastic, non-slip stool. Keep the floor dry. Clean up any water that spills on the floor as soon as it happens. Remove soap buildup in the tub or shower regularly. Attach bath mats securely with double-sided non-slip rug tape. Do not have throw rugs and other things on the floor that can make you trip. What can I do in the bedroom? Use night lights. Make sure that you have a light by your bed that is easy to reach. Do not use any sheets or blankets that are too big for your bed. They should not hang down onto the floor. Have a firm chair that has side  arms. You can use this for support while you get dressed. Do not have throw rugs and other things on the floor that can make you trip. What can I do in the kitchen? Clean up any spills right away. Avoid walking on wet floors. Keep items that you use a lot in easy-to-reach places. If you need to reach something above you, use a strong step stool that has a grab bar. Keep electrical cords out of the way. Do not use floor polish or wax that makes floors slippery. If you must use wax, use non-skid floor wax. Do not have throw rugs and other things on the floor that can make you trip. What can I do with my stairs? Do not leave any items on the stairs. Make sure that there are handrails on both sides of the stairs and use them. Fix handrails that are broken or loose. Make sure that handrails are as long as the stairways. Check any carpeting to make sure that it is firmly attached to the stairs. Fix any carpet that is loose or worn. Avoid having throw rugs at the top or bottom of the stairs. If you do have throw rugs, attach them to the floor with carpet tape. Make sure that you have a light switch at the top of the stairs and the  bottom of the stairs. If you do not have them, ask someone to add them for you. What else can I do to help prevent falls? Wear shoes that: Do not have high heels. Have rubber bottoms. Are comfortable and fit you well. Are closed at the toe. Do not wear sandals. If you use a stepladder: Make sure that it is fully opened. Do not climb a closed stepladder. Make sure that both sides of the stepladder are locked into place. Ask someone to hold it for you, if possible. Clearly mark and make sure that you can see: Any grab bars or handrails. First and last steps. Where the edge of each step is. Use tools that help you move around (mobility aids) if they are needed. These include: Canes. Walkers. Scooters. Crutches. Turn on the lights when you go into a dark area.  Replace any light bulbs as soon as they burn out. Set up your furniture so you have a clear path. Avoid moving your furniture around. If any of your floors are uneven, fix them. If there are any pets around you, be aware of where they are. Review your medicines with your doctor. Some medicines can make you feel dizzy. This can increase your chance of falling. Ask your doctor what other things that you can do to help prevent falls. This information is not intended to replace advice given to you by your health care provider. Make sure you discuss any questions you have with your health care provider. Document Released: 12/19/2008 Document Revised: 07/31/2015 Document Reviewed: 03/29/2014 Elsevier Interactive Patient Education  2017 [employer].

## 2023-09-13 NOTE — Progress Notes (Signed)
 Subjective:   Chelsea Morgan is a 73 y.o. female who presents for Medicare Annual (Subsequent) preventive examination.  Visit Complete: Virtual I connected with  Chelsea Morgan on 09/13/23 by a audio enabled telemedicine application and verified that I am speaking with the correct person using two identifiers.  Patient Location: Home  Provider Location: Home Office  I discussed the limitations of evaluation and management by telemedicine. The patient expressed understanding and agreed to proceed.  Vital Signs: Because this visit was a virtual/telehealth visit, some criteria may be missing or patient reported. Any vitals not documented were not able to be obtained and vitals that have been documented are patient reported.  Cardiac Risk Factors include: advanced age (>76men, >42 women)     Objective:    Today's Vitals   09/13/23 1554  Weight: 132 lb (59.9 kg)  Height: 5' 2 (1.575 m)   Body mass index is 24.14 kg/m.     09/13/2023    3:56 PM 01/14/2023   10:19 AM 07/28/2022   10:42 AM 07/23/2021    9:54 AM 07/14/2020   12:50 PM 11/14/2014    7:42 AM 12/28/2013    2:00 PM  Advanced Directives  Does Patient Have a Medical Advance Directive? No;Yes Yes Yes Yes Yes Yes  No   Type of Estate agent of [employer] Power of [place];Living will Healthcare Power of [place] Living will Healthcare Power of [place];Living will Living will;Healthcare Power of Attorney    Does patient want to make changes to medical advance directive?  No - Guardian declined No - Patient declined      Copy of Healthcare Power of Attorney in Chart? No - copy requested No - copy requested No - copy requested  No - copy requested       Data saved with a previous flowsheet row definition    Current Medications (verified) Outpatient Encounter Medications as of 09/13/2023  Medication Sig   atorvastatin  (LIPITOR) 10 MG tablet Take 1 tablet by mouth once daily   b complex vitamins  capsule Take 1 capsule by mouth daily.   BIOTIN 5000 PO Take 1 tablet by mouth daily.    carvedilol  (COREG ) 12.5 MG tablet Take 1/2 (one-half) tablet by mouth twice daily   Coenzyme Q10 (COQ10) 100 MG CAPS Take 100 mg by mouth every evening.   EPINEPHrine  (EPI-PEN) 0.3 mg/0.3 mL DEVI Inject 0.3 mg into the muscle as needed (alleric reaction).   finasteride  (PROSCAR ) 5 MG tablet Take 1/2 (one-half) tablet by mouth once daily   hydrochlorothiazide  (HYDRODIURIL ) 25 MG tablet Take 1 tablet by mouth once daily   levocetirizine (XYZAL) 5 MG tablet Take 2.5-5 mg by mouth as needed for allergies.   Magnesium  250 MG TABS Take 250 mg by mouth at bedtime.   Omega-3 Fatty Acids (FISH OIL) 1360 MG CAPS Take 1,360 mg by mouth at bedtime.   POTASSIUM PO Take 1-1.5 capsules by mouth daily.   No facility-administered encounter medications on file as of 09/13/2023.    Allergies (verified) Bee venom and Sulfa antibiotics   History: Past Medical History:  Diagnosis Date   ALLERGIC RHINITIS 01/12/2008   Amyloidosis of larynx (HCC)    s/p Coblator excision of false vocal fold lesion 07/12/19, pathology AL amyloid, Kappa most compatible tracheobronchial amyloidosis   Arthritis    back   Atrial flutter (HCC)    Blood transfusion without reported diagnosis 03/09/1979   after ectopic pregnancy   CAROTID BRUITS, BILATERAL 04/01/2009  Dysrhythmia    Essential hypertension, benign 01/12/2008   HYPERLIPIDEMIA 05/02/2007   Rheumatic fever    Past Surgical History:  Procedure Laterality Date   ABLATION  12/28/2013   atrial flutter ablation by Dr Waddell   ATRIAL FLUTTER ABLATION N/A 12/28/2013   Procedure: ATRIAL FLUTTER ABLATION;  Surgeon: Danelle LELON Waddell, MD;  Location: [facility] CATH LAB;  Service: Cardiovascular;  Laterality: N/A;   CARPAL TUNNEL RELEASE Right 03/08/2005   CATARACT EXTRACTION Bilateral 2022   EYE SURGERY     LUMBAR LAMINECTOMY/DECOMPRESSION MICRODISCECTOMY N/A 01/20/2023   Procedure: Central  Decompression Lumbar Four-Five with Lateral Mass Fusion Using Autologous and Allograft Bone;  Surgeon: Duwayne Purchase, MD;  Location: [facility] OR;  Service: Orthopedics;  Laterality: N/A;  3 C-Bed   SALPINGECTOMY  03/09/1979   Family History  Problem Relation Age of Onset   Cancer Mother        stomach   Esophageal cancer Mother 46       per pt esophageal ca [facility]   Stroke Father    Cancer Paternal Aunt        colon   Colon cancer Paternal Aunt 50   Rectal cancer Neg Hx    Stomach cancer Neg Hx    Social History   Socioeconomic History   Marital status: Married    Spouse name: Not on file   Number of children: Not on file   Years of education: Not on file   Highest education level: Not on file  Occupational History   Not on file  Tobacco Use   Smoking status: Never   Smokeless tobacco: Never  Substance and Sexual Activity   Alcohol use: Yes    Alcohol/week: 0.0 standard drinks of alcohol    Comment: occasionally   Drug use: Not Currently    Comment: rare   Sexual activity: Not Currently  Other Topics Concern   Not on file  Social History Narrative   Not on file   Social Drivers of Health   Financial Resource Strain: Low Risk  (09/13/2023)   Overall Financial Resource Strain (CARDIA)    Difficulty of Paying Living Expenses: Not hard at all  Food Insecurity: No Food Insecurity (09/13/2023)   Hunger Vital Sign    Worried About Running Out of Food in the Last Year: Never true    Ran Out of Food in the Last Year: Never true  Transportation Needs: No Transportation Needs (09/13/2023)   PRAPARE - Administrator, Civil Service (Medical): No    Lack of Transportation (Non-Medical): No  Physical Activity: Insufficiently Active (09/13/2023)   Exercise Vital Sign    Days of Exercise per Week: 3 days    Minutes of Exercise per Session: 30 min  Stress: No Stress Concern Present (09/13/2023)   [employer] of Occupational Health - Occupational Stress Questionnaire     Feeling of Stress: Not at all  Social Connections: Moderately Integrated (09/13/2023)   Social Connection and Isolation Panel    Frequency of Communication with Friends and Family: More than three times a week    Frequency of Social Gatherings with Friends and Family: Three times a week    Attends Religious Services: More than 4 times per year    Active Member of Clubs or Organizations: No    Attends Banker Meetings: Never    Marital Status: Married    Tobacco Counseling Counseling given: Not Answered   Clinical Intake:  Pre-visit preparation completed: Yes  Pain : No/denies pain     Diabetes: No  How often do you need to have someone help you when you read instructions, pamphlets, or other written materials from your doctor or pharmacy?: 1 - Never  Interpreter Needed?: No  Information entered by :: Mliss Graff LPN   Activities of Daily Living    09/13/2023    3:58 PM 01/14/2023   10:23 AM  In your present state of health, do you have any difficulty performing the following activities:  Hearing? 0   Vision? 0   Difficulty concentrating or making decisions? 0   Walking or climbing stairs? 0   Dressing or bathing? 0   Doing errands, shopping? 0 0  Preparing Food and eating ? N   Using the Toilet? N   In the past six months, have you accidently leaked urine? N   Do you have problems with loss of bowel control? N   Managing your Medications? N   Managing your Finances? N     Patient Care Team: Mahlon Comer BRAVO, MD as PCP - General (Family Medicine) Waddell Danelle ORN, MD as PCP - Electrophysiology (Cardiology) Audrey Mcardle, MD as Consulting Physician (Family Medicine) Shari Easter, MD as Consulting Physician (Orthopedic Surgery) Gretta Gums, MD as Consulting Physician (Obstetrics)  Indicate any recent Medical Services you may have received from other than [facility] providers in the past year (date may be approximate).     Assessment:   This is  a routine wellness examination for Chelsea Morgan.  Hearing/Vision screen Hearing Screening - Comments:: No trouble hearing Vision Screening - Comments:: Up to date Unsure of name   Goals Addressed             This Visit's Progress    Increase physical activity   On track    Patient Stated   On track    Increase activity & drink more water     Patient Stated   Not on track    A little more walking      Patient Stated       Increase activity       Depression Screen    09/13/2023    3:58 PM 03/28/2023   10:01 AM 09/20/2022   10:09 AM 07/28/2022   10:45 AM 03/10/2022   12:51 PM 08/27/2021    9:22 AM 07/23/2021    9:53 AM  PHQ 2/9 Scores  PHQ - 2 Score 0 0 0 2 0 0 0  PHQ- 9 Score 4 2 2 8 2 1      Fall Risk    09/13/2023    3:52 PM 03/28/2023   10:00 AM 09/20/2022   10:09 AM 07/28/2022   10:37 AM 03/10/2022   12:51 PM  Fall Risk   Falls in the past year? 1 1 1 1 1   Number falls in past yr: 0 0 0 0 1  Injury with Fall? 0 1 0 0 0  Risk for fall due to :   History of fall(s)  History of fall(s)  Follow up Falls evaluation completed;Education provided;Falls prevention discussed  Falls evaluation completed Falls evaluation completed;Education provided;Falls prevention discussed Falls evaluation completed      Data saved with a previous flowsheet row definition    MEDICARE RISK AT HOME: Medicare Risk at Home Any stairs in or around the home?: Yes If so, are there any without handrails?: No Home free of loose throw rugs in walkways, pet beds, electrical cords, etc?: Yes Adequate lighting in your  home to reduce risk of falls?: Yes Life alert?: No Use of a cane, walker or w/c?: No Grab bars in the bathroom?: No Shower chair or bench in shower?: No Elevated toilet seat or a handicapped toilet?: No  TIMED UP AND GO:  Was the test performed?  No    Cognitive Function:        09/13/2023    3:54 PM 07/28/2022   10:42 AM 07/23/2021    9:55 AM 07/14/2020   12:55 PM  6CIT Screen  What  Year? 0 points 0 points 0 points 0 points  What month? 0 points 0 points 0 points 0 points  What time? 0 points 0 points 0 points 0 points  Count back from 20 0 points 0 points 0 points 0 points  Months in reverse 0 points 0 points 0 points 0 points  Repeat phrase 0 points 0 points 0 points 0 points  Total Score 0 points 0 points 0 points 0 points    Immunizations Immunization History  Administered Date(s) Administered   Influenza Nasal 12/05/2021   Influenza Split 02/05/2013, 12/06/2013, 02/05/2014, 12/07/2014, 01/14/2017, 12/21/2019   Influenza, High Dose Seasonal PF 12/21/2019, 12/10/2020   Influenza, Quadrivalent, Recombinant, Inj, Pf 12/21/2017, 12/20/2018   Influenza,inj,Quad PF,6+ Mos 01/14/2017   Influenza-Unspecified 02/05/2013, 12/06/2013, 02/05/2014, 12/07/2014, 01/14/2017, 12/21/2019   PFIZER(Purple Top)SARS-COV-2 Vaccination 04/12/2019, 05/08/2019   Pneumococcal Conjugate-13 09/03/2014   Pneumococcal Polysaccharide-23 09/15/2015   Tdap 08/07/2013   Zoster Recombinant(Shingrix) 10/30/2018, 01/10/2019   Zoster, Live 09/04/2013    TDAP status: Due, Education has been provided regarding the importance of this vaccine. Advised may receive this vaccine at local pharmacy or Health Dept. Aware to provide a copy of the vaccination record if obtained from local pharmacy or Health Dept. Verbalized acceptance and understanding.  Flu Vaccine status: Up to date  Pneumococcal vaccine status: Up to date  Covid-19 vaccine status: Information provided on how to obtain vaccines.   Qualifies for Shingles Vaccine? No   Zostavax completed Yes   Shingrix Completed?: Yes  Screening Tests Health Maintenance  Topic Date Due   DTaP/Tdap/Td (2 - Td or Tdap) 08/08/2023   INFLUENZA VACCINE  10/07/2023   MAMMOGRAM  11/30/2023   Medicare Annual Wellness (AWV)  09/12/2024   Colonoscopy  11/27/2024   Pneumococcal Vaccine: 50+ Years  Completed   DEXA SCAN  Completed   Hepatitis C Screening   Completed   Zoster Vaccines- Shingrix  Completed   Hepatitis B Vaccines  Aged Out   HPV VACCINES  Aged Out   Meningococcal B Vaccine  Aged Out   COVID-19 Vaccine  Discontinued    Health Maintenance  Health Maintenance Due  Topic Date Due   DTaP/Tdap/Td (2 - Td or Tdap) 08/08/2023    Colorectal cancer screening: No longer required.   Mammogram status: Completed  . Repeat every year  Bone Density status: Ordered  . Pt provided with contact info and advised to call to schedule appt.  Lung Cancer Screening: (Low Dose CT Chest recommended if Age 58-80 years, 20 pack-year currently smoking OR have quit w/in 15years.) does not qualify.   Lung Cancer Screening Referral:   Additional Screening:  Hepatitis C Screening: does not qualify; Completed 2022  Vision Screening: Recommended annual ophthalmology exams for early detection of glaucoma and other disorders of the eye. Is the patient up to date with their annual eye exam?  Yes  Who is the provider or what is the name of the office in which the  patient attends annual eye exams? Unsure of name If pt is not established with a provider, would they like to be referred to a provider to establish care? No .   Dental Screening: Recommended annual dental exams for proper oral hygiene    Community Resource Referral / Chronic Care Management: CRR required this visit?  No   CCM required this visit?  No     Plan:     I have personally reviewed and noted the following in the patient's chart:   Medical and social history Use of alcohol, tobacco or illicit drugs  Current medications and supplements including opioid prescriptions. Patient is not currently taking opioid prescriptions. Functional ability and status Nutritional status Physical activity Advanced directives List of other physicians Hospitalizations, surgeries, and ER visits in previous 12 months Vitals Screenings to include cognitive, depression, and falls Referrals  and appointments  In addition, I have reviewed and discussed with patient certain preventive protocols, quality metrics, and best practice recommendations. A written personalized care plan for preventive services as well as general preventive health recommendations were provided to patient.     Mliss Graff, LPN   04/08/7972   After Visit Summary: (MyChart) Due to this being a telephonic visit, the after visit summary with patients personalized plan was offered to patient via MyChart   Nurse Notes:

## 2023-09-14 DIAGNOSIS — I87391 Chronic venous hypertension (idiopathic) with other complications of right lower extremity: Secondary | ICD-10-CM | POA: Diagnosis not present

## 2023-09-14 NOTE — Progress Notes (Signed)
 Electrophysiology Office Note:   Date:  09/17/2023  ID:  Chelsea Morgan, DOB [birth year], MRN [id number]  Primary Cardiologist: None Electrophysiologist: Danelle Birmingham, MD      History of Present Illness:   Chelsea Morgan is a 73 y.o. female with h/o atrial flutter status post catheter ablation, permanent atrial fibrillation, amyloidosis of larynx, hypertension who is being seen today for evaluation for Watchman device implant at the request of Dr. Birmingham. She has a history of atrial fibrillation and underwent an ablation for atrial flutter several years ago. She experiences atrial fibrillation pretty much all of the time.  She has not been taking oral anti-coagulation. She describes herself as clumsy with falls. She previously inquired about the Watchman procedure but she is concerned about having something permanently placed in her heart.   Discussed the use of AI scribe software for clinical note transcription with the patient, who gave verbal consent to proceed.  History of Present Illness Chelsea Morgan is a 73 year old female with atrial fibrillation who presents for follow-up regarding stroke risk management.   She has been primarily in atrial fibrillation since at least 2021. Despite this, she does not experience significant symptoms and is able to perform her daily activities without limitation. Her primary concern is the increased risk of stroke associated with atrial fibrillation, which has been calculated to be approximately 4.8% per year. She has not been on anticoagulation therapy and is considering her options due to concerns about stroke risk.  Review of systems complete and found to be negative unless listed in HPI.   EP Information / Studies Reviewed:    EKG is not ordered today. EKG from 04/04/23 reviewed which showed AF      Echo 11/05/2012:  - Left ventricle: The cavity size was normal. Wall thickness was    normal. Systolic function was normal. The estimated ejection     fraction was in the range of 60% to 65%. Wall motion was normal;    there were no regional wall motion abnormalities.   Risk Assessment/Calculations:    CHA2DS2-VASc Score = 4  The patient's score is based upon: CHF History: 0 HTN History: 1 Diabetes History: 0 Stroke History: 0 Vascular Disease History: 0 Age Score: 2 Gender Score: 1       Physical Exam:   VS:  BP (!) 152/85   Pulse 89   Ht 5' 2 (1.575 m)   Wt 130 lb (59 kg)   SpO2 98%   BMI 23.78 kg/m    Wt Readings from Last 3 Encounters:  09/15/23 130 lb (59 kg)  09/13/23 132 lb (59.9 kg)  06/06/23 132 lb 12.8 oz (60.2 kg)     GEN: Well nourished, well developed in no acute distress NECK: No JVD CARDIAC: Normal rate, irregular rhythm. RESPIRATORY:  Clear to auscultation without rales, wheezing or rhonchi  ABDOMEN: Soft, non-distended EXTREMITIES:  No edema; No deformity   ASSESSMENT AND PLAN:   I have seen Chelsea Morgan in the office today who is being considered for a Watchman left atrial appendage closure device. I believe they will benefit from this procedure given their history of atrial fibrillation, CHA2DS2-VASc score of 3 and unadjusted ischemic stroke rate of 3.2% per year. Unfortunately, the patient is not felt to be a long term anticoagulation candidate secondary to falls and patient preference. The patient's chart has been reviewed and I feel that they would be a candidate for short term oral anticoagulation after Watchman implant.  It is my belief that after undergoing a LAA closure procedure, Chelsea Morgan will not need long term anticoagulation which eliminates anticoagulation side effects and major bleeding risk.   Procedural risks for the Watchman implant have been reviewed with the patient including a 0.5% risk of stroke, <1% risk of perforation and <1% risk of device embolization. Other risks include bleeding, vascular damage, tamponade, worsening renal function, and death. The patient understands  these risk and wishes to proceed.     The published clinical data on the safety and effectiveness of WATCHMAN include but are not limited to the following: - Holmes DR, Jess BEARD, Sick P et al. for the PROTECT AF Investigators. Percutaneous closure of the left atrial appendage versus warfarin therapy for prevention of stroke in patients with atrial fibrillation: a randomised non-inferiority trial. Lancet 2009; 374: 534-42. GLENWOOD Jess BEARD, Doshi SK, Jonita VEAR Satchel D et al. on behalf of the PROTECT AF Investigators. Percutaneous Left Atrial Appendage Closure for Stroke Prophylaxis in Patients With Atrial Fibrillation 2.3-Year Follow-up of the PROTECT AF (Watchman Left Atrial Appendage System for Embolic Protection in Patients With Atrial Fibrillation) Trial. Circulation 2013; 127:720-729. - Alli O, Doshi S,  Kar S, Reddy VY, Sievert H et al. Quality of Life Assessment in the Randomized PROTECT AF (Percutaneous Closure of the Left Atrial Appendage Versus Warfarin Therapy for Prevention of Stroke in Patients With Atrial Fibrillation) Trial of Patients at Risk for Stroke With Nonvalvular Atrial Fibrillation. J Am Coll Cardiol 2013; 61:1790-8. GLENWOOD Satchel DR, Archer RAMAN, Price M, Whisenant B, Sievert H, Doshi S, Huber K, Reddy V. Prospective randomized evaluation of the Watchman left atrial appendage Device in patients with atrial fibrillation versus long-term warfarin therapy; the PREVAIL trial. Journal of the [employer] of Cardiology, Vol. 4, No. 1, 2014, 1-11. - Kar S, Doshi SK, Sadhu A, Horton R, Osorio J et al. Primary outcome evaluation of a next-generation left atrial appendage closure device: results from the PINNACLE FLX trial. Circulation 2021;143(18)1754-1762.   HAS-BLED score 2 Hypertension Yes  Abnormal renal and liver function (Dialysis, transplant, Cr >2.26 mg/dL /Cirrhosis or Bilirubin >2x Normal or AST/ALT/AP >3x Normal) No  Stroke No  Bleeding No  Labile INR (Unstable/high INR) No  Elderly  (>65) Yes  Drugs or alcohol (>= 8 drinks/week, anti-plt or NSAID) No   CHA2DS2-VASc Score = 4  The patient's score is based upon: CHF History: 0 HTN History: 1 Diabetes History: 0 Stroke History: 0 Vascular Disease History: 0 Age Score: 1 Gender Score: 2      ASSESSMENT AND PLAN: Longstanding Persistent Atrial Fibrillation  Typical atrial flutter status post ablation -She appears to be minimally from this but is concerned about long-term stroke risk if she continues forward as permanent AF. She would like to try a cardioversion. I explained it is unlikely she will maintain sinus rhythm for any extended period of time without AAD or ablation. We will reassess after cardioversion for symptoms attributable to AF and/for time to recurrence. If cardioversion successful then patient would like to pursue ablation. If unable to cardiovert to sinus then will declare permanent AF. She has agreed to short term oral anti-coagulation for at least 1 month pre and 1 month post DCCV. We will start Eliquis  5mg  BID.  -Continue rate control with carvedilol  12.5 mg twice daily for now.  Secondary hypercoagulable state due to atrial fibrillation/flutter: The patient's CHA2DS2-VASc score is 4, indicating a 4.8% annual risk of stroke.   -Extensive risk and benefits discussions  regarding anticoagulation and stroke prophylaxis has been conducted today and as well as with prior visits.  Patient voices understanding and is willing to take short term anti-coagulation for cardioversion. If she converts to sinus then she is interested in ablation and will continue Eliquis . If she does not convert to sinus and is deemed permanent AF then likely pursue Watchman implant.   Hypertension -At goal today.  Recommend checking blood pressures 1-2 times per week at home and recording the values.  Recommend bringing these recordings to the primary care physician.   Signed, Fonda Kitty, MD

## 2023-09-15 ENCOUNTER — Encounter: Payer: Self-pay | Admitting: Cardiology

## 2023-09-15 ENCOUNTER — Other Ambulatory Visit: Payer: Self-pay

## 2023-09-15 ENCOUNTER — Ambulatory Visit: Attending: Cardiology | Admitting: Cardiology

## 2023-09-15 VITALS — BP 152/85 | HR 89 | Ht 62.0 in | Wt 130.0 lb

## 2023-09-15 DIAGNOSIS — I10 Essential (primary) hypertension: Secondary | ICD-10-CM

## 2023-09-15 DIAGNOSIS — I4811 Longstanding persistent atrial fibrillation: Secondary | ICD-10-CM | POA: Diagnosis not present

## 2023-09-15 DIAGNOSIS — I483 Typical atrial flutter: Secondary | ICD-10-CM | POA: Diagnosis not present

## 2023-09-15 DIAGNOSIS — D6869 Other thrombophilia: Secondary | ICD-10-CM

## 2023-09-15 DIAGNOSIS — I4821 Permanent atrial fibrillation: Secondary | ICD-10-CM

## 2023-09-15 MED ORDER — APIXABAN 5 MG PO TABS: 5.0000 mg | ORAL_TABLET | Freq: Two times a day (BID) | ORAL | 3 refills | Status: AC

## 2023-09-15 NOTE — Patient Instructions (Addendum)
 Medication Instructions:  Your physician has recommended you make the following change in your medication:  1) START taking Eliquis  5 mg twice daily   *If you need a refill on your cardiac medications before your next appointment, please call your pharmacy*  Testing/Procedures: Echocardiogram  Your physician has requested that you have an echocardiogram. Echocardiography is a painless test that uses sound waves to create images of your heart. It provides your doctor with information about the size and shape of your heart and how well your heart's chambers and valves are working. This procedure takes approximately one hour. There are no restrictions for this procedure. Please do NOT wear cologne, perfume, aftershave, or lotions (deodorant is allowed). Please arrive 15 minutes prior to your appointment time.  Cardioversion Your physician has recommended that you have a Cardioversion (DCCV). Electrical Cardioversion uses a jolt of electricity to your heart either through paddles or wired patches attached to your chest. This is a controlled, usually prescheduled, procedure. Defibrillation is done under light anesthesia in the hospital, and you usually go home the day of the procedure. This is done to get your heart back into a normal rhythm. You are not awake for the procedure. Please see the instruction sheet given to you today.  Follow-Up: At [facility], you and your health needs are our priority.  As part of our continuing mission to provide you with exceptional heart care, our providers are all part of one team.  This team includes your primary Cardiologist (physician) and Advanced Practice Providers or APPs (Physician Assistants and Nurse Practitioners) who all work together to provide you with the care you need, when you need it.  Your next appointment:   5-6 weeks  Provider:   Fonda Kitty, MD

## 2023-09-20 DIAGNOSIS — I10 Essential (primary) hypertension: Secondary | ICD-10-CM | POA: Diagnosis not present

## 2023-09-20 DIAGNOSIS — D6869 Other thrombophilia: Secondary | ICD-10-CM | POA: Diagnosis not present

## 2023-09-20 DIAGNOSIS — I4821 Permanent atrial fibrillation: Secondary | ICD-10-CM | POA: Diagnosis not present

## 2023-09-20 DIAGNOSIS — I483 Typical atrial flutter: Secondary | ICD-10-CM | POA: Diagnosis not present

## 2023-09-21 ENCOUNTER — Ambulatory Visit: Payer: Self-pay

## 2023-09-21 LAB — CBC
Hematocrit: 46.2 % (ref 34.0–46.6)
Hemoglobin: 14.6 g/dL (ref 11.1–15.9)
MCH: 29.1 pg (ref 26.6–33.0)
MCHC: 31.6 g/dL (ref 31.5–35.7)
MCV: 92 fL (ref 79–97)
Platelets: 255 x10E3/uL (ref 150–450)
RBC: 5.02 x10E6/uL (ref 3.77–5.28)
RDW: 12.9 % (ref 11.7–15.4)
WBC: 5.1 x10E3/uL (ref 3.4–10.8)

## 2023-09-21 LAB — BASIC METABOLIC PANEL WITH GFR
BUN/Creatinine Ratio: 18 (ref 12–28)
BUN: 14 mg/dL (ref 8–27)
CO2: 22 mmol/L (ref 20–29)
Calcium: 10 mg/dL (ref 8.7–10.3)
Chloride: 99 mmol/L (ref 96–106)
Creatinine, Ser: 0.78 mg/dL (ref 0.57–1.00)
Glucose: 91 mg/dL (ref 70–99)
Potassium: 4.6 mmol/L (ref 3.5–5.2)
Sodium: 138 mmol/L (ref 134–144)
eGFR: 79 mL/min/1.73

## 2023-09-26 ENCOUNTER — Ambulatory Visit: Payer: Medicare HMO | Admitting: Family Medicine

## 2023-09-28 DIAGNOSIS — M7989 Other specified soft tissue disorders: Secondary | ICD-10-CM | POA: Diagnosis not present

## 2023-09-28 DIAGNOSIS — I83812 Varicose veins of left lower extremities with pain: Secondary | ICD-10-CM | POA: Diagnosis not present

## 2023-10-03 ENCOUNTER — Other Ambulatory Visit: Payer: Self-pay | Admitting: Family Medicine

## 2023-10-14 NOTE — Progress Notes (Signed)
 Called patient with pre-procedure instructions for Monday.   Patient informed of:   Time to arrive for procedure. 0630 Remain NPO past midnight.  Must have a ride home and a responsible adult to remain with them for 24 hours post procedure.  Confirmed blood thinner. Eliquis  Confirmed no breaks in taking blood thinner for 3+ weeks prior to procedure. Confirmed patient stopped all GLP-1s and GLP-2s for at least one week before procedure.

## 2023-10-16 NOTE — Anesthesia Preprocedure Evaluation (Signed)
 Anesthesia Evaluation  Patient identified by MRN, date of birth, ID band Patient awake    Reviewed: Allergy & Precautions, H&P , NPO status , Patient's Chart, lab work & pertinent test results, reviewed documented beta blocker date and time   Airway Mallampati: II  TM Distance: >3 FB Neck ROM: Full    Dental no notable dental hx. (+) Dental Advisory Given, Teeth Intact   Pulmonary neg pulmonary ROS   Pulmonary exam normal breath sounds clear to auscultation       Cardiovascular hypertension, Pt. on medications and Pt. on home beta blockers + dysrhythmias Atrial Fibrillation  Rhythm:Irregular Rate:Normal  CV: Echo 10/29/19 ([facility]): SUMMARY  The left ventricular size is normal.  There is normal left ventricular wall thickness.  Left ventricular systolic function is low normal.  LV ejection fraction = 50-55%.  The left ventricular wall motion is normal.  LV Global L Strain =-13.0%. Global longitudinal strain is reduced, but  there is not clear apical sparing to which is typical for cardiac  amyloidosis.  Left ventricular filling pattern is indeterminate.  The left atrium is mildly dilated.  The right ventricle is mildly dilated.  The right ventricular systolic function is normal.  There is no significant valvular stenosis or regurgitation.  IVC size was normal.  There is no pericardial effusion.  There is no comparison study available.  - Recommend echo follow-up with strain imaging.       Neuro/Psych    GI/Hepatic ,GERD  Controlled,,  Endo/Other    Renal/GU      Musculoskeletal  (+) Arthritis ,    Abdominal   Peds  Hematology   Anesthesia Other Findings   Reproductive/Obstetrics                              Anesthesia Physical Anesthesia Plan  ASA: 3  Anesthesia Plan: General   Post-op Pain Management: Minimal or no pain anticipated   Induction: Intravenous  PONV Risk  Score and Plan: 3 and TIVA, Propofol  infusion and Treatment may vary due to age or medical condition  Airway Management Planned: Natural Airway  Additional Equipment:   Intra-op Plan:   Post-operative Plan:   Informed Consent: I have reviewed the patients History and Physical, chart, labs and discussed the procedure including the risks, benefits and alternatives for the proposed anesthesia with the patient or authorized representative who has indicated his/her understanding and acceptance.     Dental advisory given  Plan Discussed with: CRNA  Anesthesia Plan Comments: (PAT note written 01/17/2023 by Isaiah Ruder, PA-C.  )        Anesthesia Quick Evaluation

## 2023-10-17 ENCOUNTER — Ambulatory Visit (HOSPITAL_COMMUNITY): Payer: Self-pay | Admitting: Anesthesiology

## 2023-10-17 ENCOUNTER — Ambulatory Visit (HOSPITAL_BASED_OUTPATIENT_CLINIC_OR_DEPARTMENT_OTHER): Payer: Self-pay | Admitting: Anesthesiology

## 2023-10-17 ENCOUNTER — Encounter (HOSPITAL_COMMUNITY)
Admission: RE | Disposition: A | Discharge status: Home / Self Care | Payer: Self-pay | Source: Home / Self Care | Attending: Cardiology

## 2023-10-17 ENCOUNTER — Ambulatory Visit (HOSPITAL_COMMUNITY)
Admission: RE | Admit: 2023-10-17 | Discharge: 2023-10-17 | Disposition: A | Discharge status: Home / Self Care | Attending: Cardiology | Admitting: Cardiology

## 2023-10-17 ENCOUNTER — Other Ambulatory Visit: Payer: Self-pay

## 2023-10-17 ENCOUNTER — Encounter (HOSPITAL_COMMUNITY): Payer: Self-pay | Admitting: Cardiology

## 2023-10-17 DIAGNOSIS — Z79899 Other long term (current) drug therapy: Secondary | ICD-10-CM | POA: Diagnosis not present

## 2023-10-17 DIAGNOSIS — K219 Gastro-esophageal reflux disease without esophagitis: Secondary | ICD-10-CM | POA: Insufficient documentation

## 2023-10-17 DIAGNOSIS — I4819 Other persistent atrial fibrillation: Secondary | ICD-10-CM

## 2023-10-17 DIAGNOSIS — I4892 Unspecified atrial flutter: Secondary | ICD-10-CM | POA: Insufficient documentation

## 2023-10-17 DIAGNOSIS — Z7901 Long term (current) use of anticoagulants: Secondary | ICD-10-CM | POA: Diagnosis not present

## 2023-10-17 DIAGNOSIS — E785 Hyperlipidemia, unspecified: Secondary | ICD-10-CM | POA: Diagnosis not present

## 2023-10-17 DIAGNOSIS — I10 Essential (primary) hypertension: Secondary | ICD-10-CM | POA: Insufficient documentation

## 2023-10-17 DIAGNOSIS — I4811 Longstanding persistent atrial fibrillation: Secondary | ICD-10-CM | POA: Diagnosis not present

## 2023-10-17 DIAGNOSIS — Z01818 Encounter for other preprocedural examination: Secondary | ICD-10-CM

## 2023-10-17 DIAGNOSIS — D6869 Other thrombophilia: Secondary | ICD-10-CM | POA: Diagnosis not present

## 2023-10-17 HISTORY — PX: CARDIOVERSION: EP1203

## 2023-10-17 SURGERY — CARDIOVERSION (CATH LAB)
Anesthesia: General

## 2023-10-17 MED ORDER — LIDOCAINE 2% (20 MG/ML) 5 ML SYRINGE
INTRAMUSCULAR | Status: DC | PRN
  Administered 2023-10-17 (×2): 60 mg via INTRAVENOUS

## 2023-10-17 MED ORDER — PROPOFOL 10 MG/ML IV BOLUS
INTRAVENOUS | Status: DC | PRN
  Administered 2023-10-17 (×2): 50 mg via INTRAVENOUS

## 2023-10-17 MED ORDER — SODIUM CHLORIDE 0.9 % IV SOLN: INTRAVENOUS | Status: DC

## 2023-10-17 SURGICAL SUPPLY — 1 items: PAD DEFIB RADIO PHYSIO CONN (PAD) ×2 IMPLANT

## 2023-10-17 NOTE — Transfer of Care (Signed)
 Immediate Anesthesia Transfer of Care Note  Patient: Chelsea Morgan  Procedure(s) Performed: CARDIOVERSION  Patient Location: PACU  Anesthesia Type:MAC  Level of Consciousness: drowsy  Airway & Oxygen Therapy: Patient Spontanous Breathing and Patient connected to nasal cannula oxygen  Post-op Assessment: Report given to RN and Post -op Vital signs reviewed and stable  Post vital signs: Reviewed and stable  Last Vitals:  Vitals Value Taken Time  BP    Temp    Pulse    Resp    SpO2      Last Pain:  Vitals:   10/17/23 0655  TempSrc: Temporal  PainSc: 0-No pain         Complications: There were no known notable events for this encounter.

## 2023-10-17 NOTE — Procedures (Addendum)
 Electrical Cardioversion Procedure Note Chelsea Morgan [id number] 12/16/1950  Procedure: Electrical Cardioversion Indications:  Atrial Fibrillation  Procedure Details Consent: Risks of procedure as well as the alternatives and risks of each were explained to the (patient/caregiver).  Consent for procedure obtained. Time Out: Verified patient identification, verified procedure, site/side was marked, verified correct patient position, special equipment/implants available, medications/allergies/relevent history reviewed, required imaging and test results available.  Performed  Patient placed on cardiac monitor, pulse oximetry, supplemental oxygen as necessary.  Sedation given: Pt sedated by anesthesia with lidocaine  60 mg and diprovan 50 mg IV.  Pacer pads placed anterior and posterior chest.  Cardioverted 1 time(s).  Cardioverted at 360J.  Evaluation Findings: Post procedure EKG shows: Sinus bradycardia Complications: None Patient did tolerate procedure well.   Chelsea Morgan 10/17/2023, 7:16 AM

## 2023-10-17 NOTE — H&P (Signed)
 Office Visit 09/15/2023 [facility] [employer]. [facility] Kennyth Chew, MD Cardiology Longstanding persistent atrial fibrillation [facility]) +3 more Dx Referred by Mahlon Comer BRAVO, MD Reason for Visit   Additional Documentation  Vitals: BP 152/85 Important    Pulse 89   Ht 5' 2 (1.575 m)   Wt 59 kg   SpO2 98%   BMI 23.78 kg/m   BSA 1.61 m      More Vitals  Flowsheets: CHADS2-VASc Score,   NEWS,   MEWS Score,   Vital Signs,   Anthropometrics  Encounter Info: Billing Info,   History,   Allergies,   Detailed Report   All Notes   Progress Notes by Kennyth Chew, MD at 09/15/2023 9:45 AM  Author: Kennyth Chew, MD Author Type: Physician Filed: 09/17/2023 12:02 AM  Note Status: Signed Cosign: Cosign Not Required Encounter Date: 09/15/2023  Editor: Kennyth Chew, MD (Physician)              Electrophysiology Office Note:   Date:  09/17/2023  ID:  Chelsea Morgan, DOB [birth year], MRN [id number]   Primary Cardiologist: None Electrophysiologist: Danelle Birmingham, MD       History of Present Illness:   Chelsea Morgan is a 73 y.o. female with h/o atrial flutter status post catheter ablation, permanent atrial fibrillation, amyloidosis of larynx, hypertension who is being seen today for evaluation for Watchman device implant at the request of Dr. Birmingham. She has a history of atrial fibrillation and underwent an ablation for atrial flutter several years ago. She experiences atrial fibrillation pretty much all of the time.  She has not been taking oral anti-coagulation. She describes herself as clumsy with falls. She previously inquired about the Watchman procedure but she is concerned about having something permanently placed in her heart.    Discussed the use of AI scribe software for clinical note transcription with the patient, who gave verbal consent to proceed.   History of Present Illness Chelsea Morgan is a 73 year old female with atrial  fibrillation who presents for follow-up regarding stroke risk management.    She has been primarily in atrial fibrillation since at least 2021. Despite this, she does not experience significant symptoms and is able to perform her daily activities without limitation. Her primary concern is the increased risk of stroke associated with atrial fibrillation, which has been calculated to be approximately 4.8% per year. She has not been on anticoagulation therapy and is considering her options due to concerns about stroke risk.   Review of systems complete and found to be negative unless listed in HPI.    EP Information / Studies Reviewed:     EKG is not ordered today. EKG from 04/04/23 reviewed which showed AF       Echo 11/05/2012:  - Left ventricle: The cavity size was normal. Wall thickness was    normal. Systolic function was normal. The estimated ejection    fraction was in the range of 60% to 65%. Wall motion was normal;    there were no regional wall motion abnormalities.    Risk Assessment/Calculations:     CHA2DS2-VASc Score = 4  The patient's score is based upon: CHF History: 0 HTN History: 1 Diabetes History: 0 Stroke History: 0 Vascular Disease History: 0 Age Score: 2 Gender Score: 1         Physical Exam:   VS:  BP (!) 152/85   Pulse 89  Ht 5' 2 (1.575 m)   Wt 130 lb (59 kg)   SpO2 98%   BMI 23.78 kg/m       Wt Readings from Last 3 Encounters:  09/15/23 130 lb (59 kg)  09/13/23 132 lb (59.9 kg)  06/06/23 132 lb 12.8 oz (60.2 kg)      GEN: Well nourished, well developed in no acute distress NECK: No JVD CARDIAC: Normal rate, irregular rhythm. RESPIRATORY:  Clear to auscultation without rales, wheezing or rhonchi  ABDOMEN: Soft, non-distended EXTREMITIES:  No edema; No deformity    ASSESSMENT AND PLAN:   I have seen Chelsea Morgan in the office today who is being considered for a Watchman left atrial appendage closure device. I believe they will benefit from  this procedure given their history of atrial fibrillation, CHA2DS2-VASc score of 3 and unadjusted ischemic stroke rate of 3.2% per year. Unfortunately, the patient is not felt to be a long term anticoagulation candidate secondary to falls and patient preference. The patient's chart has been reviewed and I feel that they would be a candidate for short term oral anticoagulation after Watchman implant.    It is my belief that after undergoing a LAA closure procedure, Chelsea Morgan will not need long term anticoagulation which eliminates anticoagulation side effects and major bleeding risk.    Procedural risks for the Watchman implant have been reviewed with the patient including a 0.5% risk of stroke, <1% risk of perforation and <1% risk of device embolization. Other risks include bleeding, vascular damage, tamponade, worsening renal function, and death. The patient understands these risk and wishes to proceed.       The published clinical data on the safety and effectiveness of WATCHMAN include but are not limited to the following: - Holmes DR, Jess BEARD, Sick P et al. for the PROTECT AF Investigators. Percutaneous closure of the left atrial appendage versus warfarin therapy for prevention of stroke in patients with atrial fibrillation: a randomised non-inferiority trial. Lancet 2009; 374: 534-42. GLENWOOD Jess BEARD, Doshi SK, Jonita VEAR Satchel D et al. on behalf of the PROTECT AF Investigators. Percutaneous Left Atrial Appendage Closure for Stroke Prophylaxis in Patients With Atrial Fibrillation 2.3-Year Follow-up of the PROTECT AF (Watchman Left Atrial Appendage System for Embolic Protection in Patients With Atrial Fibrillation) Trial. Circulation 2013; 127:720-729. - Alli O, Doshi S,  Kar S, Reddy VY, Sievert H et al. Quality of Life Assessment in the Randomized PROTECT AF (Percutaneous Closure of the Left Atrial Appendage Versus Warfarin Therapy for Prevention of Stroke in Patients With Atrial Fibrillation) Trial  of Patients at Risk for Stroke With Nonvalvular Atrial Fibrillation. J Am Coll Cardiol 2013; 61:1790-8. GLENWOOD Satchel DR, Archer GORMAN, Price M, Whisenant B, Sievert H, Doshi S, Huber K, Reddy V. Prospective randomized evaluation of the Watchman left atrial appendage Device in patients with atrial fibrillation versus long-term warfarin therapy; the PREVAIL trial. Journal of the [employer] of Cardiology, Vol. 4, No. 1, 2014, 1-11. - Kar S, Doshi SK, Sadhu A, Horton R, Osorio J et al. Primary outcome evaluation of a next-generation left atrial appendage closure device: results from the PINNACLE FLX trial. Circulation 2021;143(18)1754-1762.    HAS-BLED score 2 Hypertension Yes  Abnormal renal and liver function (Dialysis, transplant, Cr >2.26 mg/dL /Cirrhosis or Bilirubin >2x Normal or AST/ALT/AP >3x Normal) No  Stroke No  Bleeding No  Labile INR (Unstable/high INR) No  Elderly (>65) Yes  Drugs or alcohol (>= 8 drinks/week, anti-plt or NSAID) No  CHA2DS2-VASc Score = 4  The patient's score is based upon: CHF History: 0 HTN History: 1 Diabetes History: 0 Stroke History: 0 Vascular Disease History: 0 Age Score: 1 Gender Score: 2       ASSESSMENT AND PLAN: Longstanding Persistent Atrial Fibrillation  Typical atrial flutter status post ablation -She appears to be minimally from this but is concerned about long-term stroke risk if she continues forward as permanent AF. She would like to try a cardioversion. I explained it is unlikely she will maintain sinus rhythm for any extended period of time without AAD or ablation. We will reassess after cardioversion for symptoms attributable to AF and/for time to recurrence. If cardioversion successful then patient would like to pursue ablation. If unable to cardiovert to sinus then will declare permanent AF. She has agreed to short term oral anti-coagulation for at least 1 month pre and 1 month post DCCV. We will start Eliquis  5mg  BID.  -Continue rate control  with carvedilol  12.5 mg twice daily for now.   Secondary hypercoagulable state due to atrial fibrillation/flutter: The patient's CHA2DS2-VASc score is 4, indicating a 4.8% annual risk of stroke.   -Extensive risk and benefits discussions regarding anticoagulation and stroke prophylaxis has been conducted today and as well as with prior visits.  Patient voices understanding and is willing to take short term anti-coagulation for cardioversion. If she converts to sinus then she is interested in ablation and will continue Eliquis . If she does not convert to sinus and is deemed permanent AF then likely pursue Watchman implant.    Hypertension -At goal today.  Recommend checking blood pressures 1-2 times per week at home and recording the values.  Recommend bringing these recordings to the primary care physician.     Signed, Fonda Kitty, MD      For DCCV; compliant with apixaban ; no changes Redell Shallow

## 2023-10-17 NOTE — Discharge Instructions (Signed)

## 2023-10-17 NOTE — Interval H&P Note (Signed)
 History and Physical Interval Note:  10/17/2023 7:18 AM  Chelsea Morgan  has presented today for surgery, with the diagnosis of afib.  The various methods of treatment have been discussed with the patient and family. After consideration of risks, benefits and other options for treatment, the patient has consented to  Procedure(s): CARDIOVERSION (N/A) as a surgical intervention.  The patient's history has been reviewed, patient examined, no change in status, stable for surgery.  I have reviewed the patient's chart and labs.  Questions were answered to the patient's satisfaction.     Redell Shallow

## 2023-10-18 NOTE — Anesthesia Postprocedure Evaluation (Signed)
 Anesthesia Post Note  Patient: Chelsea Morgan  Procedure(s) Performed: CARDIOVERSION     Patient location during evaluation: Cath Lab Anesthesia Type: General Level of consciousness: sedated and patient cooperative Pain management: pain level controlled Vital Signs Assessment: post-procedure vital signs reviewed and stable Respiratory status: spontaneous breathing Cardiovascular status: stable Anesthetic complications: no   There were no known notable events for this encounter.  Last Vitals:  Vitals:   10/17/23 0800 10/17/23 0805  BP: 126/62 133/65  Pulse: (!) 49 (!) 50  Resp: 18 12  Temp:  37.1 C  SpO2: 97% 99%    Last Pain:  Vitals:   10/17/23 0805  TempSrc: Temporal  PainSc: 0-No pain                 Norleen Pope

## 2023-10-19 DIAGNOSIS — H02831 Dermatochalasis of right upper eyelid: Secondary | ICD-10-CM | POA: Diagnosis not present

## 2023-10-19 DIAGNOSIS — H16223 Keratoconjunctivitis sicca, not specified as Sjogren's, bilateral: Secondary | ICD-10-CM | POA: Diagnosis not present

## 2023-10-24 ENCOUNTER — Ambulatory Visit (HOSPITAL_COMMUNITY)
Admission: RE | Admit: 2023-10-24 | Discharge: 2023-10-24 | Disposition: A | Discharge status: Home / Self Care | Source: Ambulatory Visit | Attending: Cardiology | Admitting: Cardiology

## 2023-10-24 DIAGNOSIS — I483 Typical atrial flutter: Secondary | ICD-10-CM | POA: Diagnosis not present

## 2023-10-24 DIAGNOSIS — I4811 Longstanding persistent atrial fibrillation: Secondary | ICD-10-CM | POA: Insufficient documentation

## 2023-10-24 DIAGNOSIS — D6869 Other thrombophilia: Secondary | ICD-10-CM | POA: Insufficient documentation

## 2023-10-24 DIAGNOSIS — I10 Essential (primary) hypertension: Secondary | ICD-10-CM | POA: Insufficient documentation

## 2023-10-24 LAB — ECHOCARDIOGRAM COMPLETE: S' Lateral: 2.9 cm

## 2023-10-24 NOTE — Progress Notes (Unsigned)
 Electrophysiology Office Note:   Date:  10/26/2023  ID:  Chelsea Morgan, DOB [birth year], MRN [id number]  Primary Cardiologist: None Electrophysiologist: Danelle Birmingham, MD      History of Present Illness:   Chelsea Morgan is a 73 y.o. female with h/o atrial flutter status post catheter ablation, permanent atrial fibrillation, amyloidosis of larynx, hypertension who i was initially sent to me for evaluation for Watchman device implant at the request of Dr. Birmingham. She has a history of atrial fibrillation and underwent an ablation for atrial flutter several years ago. She experiences atrial fibrillation pretty much all of the time.  She has not been taking oral anti-coagulation. She describes herself as clumsy with falls. She previously inquired about the Watchman procedure but she is concerned about having something permanently placed in her heart.  Because of this she requested to pursue AF ablation and if she maintains sinus rhythm then to discontinue oral anticoagulation.  She was sent for cardioversion to see if she was able to maintain sinus for any period of time.  Discussed the use of AI scribe software for clinical note transcription with the patient, who gave verbal consent to proceed.  History of Present Illness Chelsea Morgan is a 73 year old female with atrial fibrillation who presents for follow-up after recent cardioversion on 10/17/2023.Chelsea Morgan  She experienced a period of normal sinus rhythm for two to three days, during which she felt increased energy, but subsequently reverted to atrial fibrillation. She has been in atrial fibrillation for a prolonged period, and previous attempts to maintain normal rhythm have been challenging.  She recalls a previous cardioversion procedure that successfully restored her to normal rhythm with minimal intervention. This experience was positive, as she noted the ease with which her heart rhythm was corrected.  She is currently taking Eliquis , a blood thinner,  and has not noticed any issues.  She is otherwise doing relatively well with no new or acute complaints today.  Review of systems complete and found to be negative unless listed in HPI.   EP Information / Studies Reviewed:    EKG is ordered today. Personal review as below. EKG Interpretation Date/Time:  Tuesday October 25 2023 11:05:15 EDT Ventricular Rate:  91 PR Interval:    QRS Duration:  82 QT Interval:  370 QTC Calculation: 455 R Axis:   28  Text Interpretation: Atrial fibrillation When compared with ECG of 17-Oct-2023 07:42, Atrial fibrillation has replaced Sinus rhythm Vent. rate has increased BY  38 BPM Nonspecific T wave abnormality now evident in Lateral leads Confirmed by Kennyth Chew [phone]) on 10/25/2023 11:59:41 AM   Echo 11/05/2012:  - Left ventricle: The cavity size was normal. Wall thickness was    normal. Systolic function was normal. The estimated ejection    fraction was in the range of 60% to 65%. Wall motion was normal;    there were no regional wall motion abnormalities.   Risk Assessment/Calculations:    CHA2DS2-VASc Score = 4  The patient's score is based upon: CHF History: 0 HTN History: 1 Diabetes History: 0 Stroke History: 0 Vascular Disease History: 0 Age Score: 2 Gender Score: 1       Physical Exam:   VS:  BP 134/76   Pulse 91   Ht 5' 2 (1.575 m)   Wt 132 lb 8 oz (60.1 kg)   SpO2 95%   BMI 24.23 kg/m    Wt Readings from Last 3 Encounters:  10/25/23 132 lb 8 oz (60.1  kg)  10/25/23 134 lb 6 oz (61 kg)  09/15/23 130 lb (59 kg)     GEN: Well nourished, well developed in no acute distress NECK: No JVD CARDIAC: Normal rate, irregular rhythm. RESPIRATORY:  Clear to auscultation without rales, wheezing or rhonchi  ABDOMEN: Soft, non-distended EXTREMITIES:  No edema; No deformity   ASSESSMENT AND PLAN:    Longstanding Persistent Atrial Fibrillation: Symptomatic despite adequate rate control.  She reports symptomatic improvement after  cardioversion.  Was able to maintain sinus for 2 to 3 days. Typical atrial flutter status post ablation: -Discussed treatment options today for AF including antiarrhythmic drug therapy and ablation. Discussed risks, recovery and likelihood of success with each treatment strategy. Risk, benefits, and alternatives to EP study and ablation for afib were discussed. These risks include but are not limited to stroke, bleeding, vascular damage, tamponade, perforation, damage to the esophagus, lungs, phrenic nerve and other structures, pulmonary vein stenosis, worsening renal function, coronary vasospasm and death.  Discussed potential need for repeat ablation procedures and antiarrhythmic drugs after an initial ablation. The patient understands these risk and wishes to proceed.  We will therefore proceed with catheter ablation at the next available time.  Carto, ICE, anesthesia are requested for the procedure.   - Continue carvedilol  12.5 mg twice daily  Secondary hypercoagulable state due to atrial fibrillation/flutter: The patient's CHA2DS2-VASc score is 4, indicating a 4.8% annual risk of stroke.   - Patient is currently at taking Eliquis  and tolerating this without side effects or bleeding issues..  We will continue Eliquis  5 mg twice daily until at least 60 days after ablation.  Hypertension -At goal today.  Recommend checking blood pressures 1-2 times per week at home and recording the values.  Recommend bringing these recordings to the primary care physician.   Signed, Fonda Kitty, MD

## 2023-10-25 ENCOUNTER — Ambulatory Visit: Attending: Cardiology | Admitting: Cardiology

## 2023-10-25 ENCOUNTER — Other Ambulatory Visit: Payer: Self-pay | Admitting: Family Medicine

## 2023-10-25 ENCOUNTER — Ambulatory Visit (INDEPENDENT_AMBULATORY_CARE_PROVIDER_SITE_OTHER): Admitting: Family Medicine

## 2023-10-25 ENCOUNTER — Other Ambulatory Visit: Payer: Self-pay

## 2023-10-25 ENCOUNTER — Encounter: Payer: Self-pay | Admitting: Cardiology

## 2023-10-25 ENCOUNTER — Encounter: Payer: Self-pay | Admitting: Family Medicine

## 2023-10-25 VITALS — BP 134/76 | HR 91 | Ht 62.0 in | Wt 132.5 lb

## 2023-10-25 VITALS — BP 130/72 | HR 84 | Temp 98.2°F | Ht 62.0 in | Wt 134.4 lb

## 2023-10-25 DIAGNOSIS — I483 Typical atrial flutter: Secondary | ICD-10-CM

## 2023-10-25 DIAGNOSIS — I4811 Longstanding persistent atrial fibrillation: Secondary | ICD-10-CM | POA: Diagnosis not present

## 2023-10-25 DIAGNOSIS — I10 Essential (primary) hypertension: Secondary | ICD-10-CM

## 2023-10-25 DIAGNOSIS — L659 Nonscarring hair loss, unspecified: Secondary | ICD-10-CM

## 2023-10-25 DIAGNOSIS — I4892 Unspecified atrial flutter: Secondary | ICD-10-CM

## 2023-10-25 DIAGNOSIS — D6869 Other thrombophilia: Secondary | ICD-10-CM

## 2023-10-25 DIAGNOSIS — I480 Paroxysmal atrial fibrillation: Secondary | ICD-10-CM | POA: Diagnosis not present

## 2023-10-25 DIAGNOSIS — E785 Hyperlipidemia, unspecified: Secondary | ICD-10-CM

## 2023-10-25 LAB — LIPID PANEL
Cholesterol: 152 mg/dL (ref 0–200)
HDL: 55.9 mg/dL (ref >39.00)
LDL Cholesterol: 83 mg/dL (ref 0–99)
NonHDL: 96.47
Total CHOL/HDL Ratio: 3
Triglycerides: 65 mg/dL (ref 0.0–149.0)
VLDL: 13 mg/dL (ref 0.0–40.0)

## 2023-10-25 LAB — CBC WITH DIFFERENTIAL/PLATELET
Basophils Absolute: 0 K/uL (ref 0.0–0.1)
Basophils Relative: 0.8 % (ref 0.0–3.0)
Eosinophils Absolute: 0.1 K/uL (ref 0.0–0.7)
Eosinophils Relative: 3.5 % (ref 0.0–5.0)
HCT: 43.9 % (ref 36.0–46.0)
Hemoglobin: 14.5 g/dL (ref 12.0–15.0)
Lymphocytes Relative: 32.3 % (ref 12.0–46.0)
Lymphs Abs: 1.2 K/uL (ref 0.7–4.0)
MCHC: 33 g/dL (ref 30.0–36.0)
MCV: 88.7 fl (ref 78.0–100.0)
Monocytes Absolute: 0.3 K/uL (ref 0.1–1.0)
Monocytes Relative: 8.9 % (ref 3.0–12.0)
Neutro Abs: 2 K/uL (ref 1.4–7.7)
Neutrophils Relative %: 54.5 % (ref 43.0–77.0)
Platelets: 238 K/uL (ref 150.0–400.0)
RBC: 4.95 Mil/uL (ref 3.87–5.11)
RDW: 13.8 % (ref 11.5–15.5)
WBC: 3.6 K/uL — ABNORMAL LOW (ref 4.0–10.5)

## 2023-10-25 LAB — BASIC METABOLIC PANEL WITH GFR
BUN: 13 mg/dL (ref 6–23)
CO2: 30 meq/L (ref 19–32)
Calcium: 9.7 mg/dL (ref 8.4–10.5)
Chloride: 100 meq/L (ref 96–112)
Creatinine, Ser: 0.65 mg/dL (ref 0.40–1.20)
GFR: 86.31 mL/min (ref >60.00)
Glucose, Bld: 96 mg/dL (ref 70–99)
Potassium: 4 meq/L (ref 3.5–5.1)
Sodium: 138 meq/L (ref 135–145)

## 2023-10-25 LAB — HEPATIC FUNCTION PANEL
ALT: 19 U/L (ref 0–35)
AST: 19 U/L (ref 0–37)
Albumin: 4.3 g/dL (ref 3.5–5.2)
Alkaline Phosphatase: 79 U/L (ref 39–117)
Bilirubin, Direct: 0.3 mg/dL (ref 0.0–0.3)
Total Bilirubin: 1.3 mg/dL — ABNORMAL HIGH (ref 0.2–1.2)
Total Protein: 7 g/dL (ref 6.0–8.3)

## 2023-10-25 LAB — TSH: TSH: 2.02 u[IU]/mL (ref 0.35–5.50)

## 2023-10-25 NOTE — Telephone Encounter (Signed)
 Pt needs appt . Per last visit 6 month appt

## 2023-10-25 NOTE — Telephone Encounter (Signed)
 Patient has appt scheduled for 10/25/23.

## 2023-10-25 NOTE — Assessment & Plan Note (Signed)
 Chronic problem.  On Coreg  and hydrochlorothiazide  w/ good control.  Currently asymptomatic.  Check labs due to diuretic use but no anticipated med changes.  Will follow.

## 2023-10-25 NOTE — Patient Instructions (Addendum)
 Medication Instructions:  Your physician recommends that you continue on your current medications as directed. Please refer to the Current Medication list given to you today.  *If you need a refill on your cardiac medications before your next appointment, please call your pharmacy*  Testing/Procedures: Ablation Your physician has recommended that you have an ablation. Catheter ablation is a medical procedure used to treat some cardiac arrhythmias (irregular heartbeats). During catheter ablation, a long, thin, flexible tube is put into a blood vessel in your groin (upper thigh), or neck. This tube is called an ablation catheter. It is then guided to your heart through the blood vessel. Radio frequency waves destroy small areas of heart tissue where abnormal heartbeats may cause an arrhythmia to start.   You are scheduled for Atrial Fibrillation/Flutter Ablation on Wednesday, October 22nd with Dr. Sidra Kitty.Please arrive at the Main Entrance A at [facility]: [address] [place], [place] [address] at 11:30AM   What To Expect:  Labs: you will need to have lab work drawn the week of October 6th. Please go to any [facility] location to have these drawn - no appointment is needed. You will receive procedure instructions either through MyChart or in the mail 4-6 week prior to your procedure.  After your procedure we recommend no driving for 3 days, no lifting over 10 lbs for 5 days, and no work or strenuous activity for 7 days.  Please contact our office at [phone] if you have any questions.    Follow-Up: We will contact you to schedule your post-procedure appointments.

## 2023-10-25 NOTE — Assessment & Plan Note (Signed)
 Deteriorated.  Unfortunately had cardioversion last week and is back in Afib today.  Has appt w/ Cardiology later today.  Thankfully is not having palpitations and seems asymptomatic.  Is rate controlled and anticoagulated on Eliquis  BID.  Will follow along.

## 2023-10-25 NOTE — Progress Notes (Signed)
   Subjective:    Patient ID: Chelsea Morgan, female    DOB: 11-12-1950, 73 y.o.   MRN: [id number]  HPI Hyperlipidemia- chronic problem, on Lipitor 10mg  daily.  Denies abd pain, N/V  HTN- chronic problem, on Coreg  6.25mg  BID, hydrochlorothiazide  25mg  daily w/ good control.  Denies CP, SOB, HA's, visual changes, edema.  Afib- pt had cardioversion last week, ECHO yesterday.  Has cardiology appt today.  Reports that cardioversion had success for a few days but then was out of rhythm again.  Pt reports she is symptomatic at times- palpitations.   Review of Systems For ROS see HPI     Objective:   Physical Exam Vitals reviewed.  Constitutional:      General: She is not in acute distress.    Appearance: Normal appearance. She is well-developed. She is not ill-appearing.  HENT:     Head: Normocephalic and atraumatic.  Eyes:     Conjunctiva/sclera: Conjunctivae normal.     Pupils: Pupils are equal, round, and reactive to light.  Neck:     Thyroid : No thyromegaly.  Cardiovascular:     Rate and Rhythm: Normal rate. Rhythm irregular.     Heart sounds: Normal heart sounds. No murmur heard. Pulmonary:     Effort: Pulmonary effort is normal. No respiratory distress.     Breath sounds: Normal breath sounds.  Abdominal:     General: There is no distension.     Palpations: Abdomen is soft.     Tenderness: There is no abdominal tenderness.  Musculoskeletal:     Cervical back: Normal range of motion and neck supple.  Lymphadenopathy:     Cervical: No cervical adenopathy.  Skin:    General: Skin is warm and dry.  Neurological:     General: No focal deficit present.     Mental Status: She is alert and oriented to person, place, and time.  Psychiatric:        Mood and Affect: Mood normal.        Behavior: Behavior normal.        Thought Content: Thought content normal.           Assessment & Plan:

## 2023-10-25 NOTE — Patient Instructions (Signed)
 Schedule your complete physical in 6 months We'll notify you of your lab results and make any changes if needed Ask Cardiology about a Calcium  score to better assess your risk Call with any questions or concerns Stay Safe!  Stay Healthy! Hang in there!!!

## 2023-10-25 NOTE — Assessment & Plan Note (Signed)
 Chronic problem.  On Lipitor 10mg  daily w/o difficulty.  Check labs.  Adjust meds prn

## 2023-10-26 ENCOUNTER — Ambulatory Visit: Payer: Self-pay | Admitting: Family Medicine

## 2023-10-26 NOTE — Progress Notes (Signed)
 noted

## 2023-10-26 NOTE — Telephone Encounter (Signed)
 Copied from CRM #[id number]. Topic: Clinical - Lab/Test Results >> Oct 26, 2023 11:37 AM Chiquita SQUIBB wrote: Reason for CRM: Patient is calling back in regarding lab results, relayed messages, patient had no questions.

## 2023-10-28 ENCOUNTER — Other Ambulatory Visit: Payer: Self-pay | Admitting: Family Medicine

## 2023-12-06 DIAGNOSIS — Z01419 Encounter for gynecological examination (general) (routine) without abnormal findings: Secondary | ICD-10-CM | POA: Diagnosis not present

## 2023-12-06 DIAGNOSIS — Z1231 Encounter for screening mammogram for malignant neoplasm of breast: Secondary | ICD-10-CM | POA: Diagnosis not present

## 2023-12-07 ENCOUNTER — Telehealth (HOSPITAL_COMMUNITY): Payer: Self-pay

## 2023-12-07 ENCOUNTER — Encounter (HOSPITAL_COMMUNITY): Payer: Self-pay

## 2023-12-07 NOTE — Telephone Encounter (Signed)
 Attempted to reach patient to discuss upcoming procedure, no answer. Left VM for patient to return call.

## 2023-12-10 LAB — BASIC METABOLIC PANEL WITH GFR
BUN/Creatinine Ratio: 20 (ref 12–28)
BUN: 14 mg/dL (ref 8–27)
CO2: 24 mmol/L (ref 20–29)
Calcium: 9.8 mg/dL (ref 8.7–10.3)
Chloride: 99 mmol/L (ref 96–106)
Creatinine, Ser: 0.71 mg/dL (ref 0.57–1.00)
Glucose: 90 mg/dL (ref 70–99)
Potassium: 4.1 mmol/L (ref 3.5–5.2)
Sodium: 139 mmol/L (ref 134–144)
eGFR: 89 mL/min/1.73 (ref >59)

## 2023-12-10 LAB — CBC
Hematocrit: 44.8 % (ref 34.0–46.6)
Hemoglobin: 14.5 g/dL (ref 11.1–15.9)
MCH: 29.5 pg (ref 26.6–33.0)
MCHC: 32.4 g/dL (ref 31.5–35.7)
MCV: 91 fL (ref 79–97)
Platelets: 240 x10E3/uL (ref 150–450)
RBC: 4.91 x10E6/uL (ref 3.77–5.28)
RDW: 12.6 % (ref 11.7–15.4)
WBC: 5.8 x10E3/uL (ref 3.4–10.8)

## 2023-12-11 ENCOUNTER — Ambulatory Visit: Payer: Self-pay | Admitting: Cardiology

## 2023-12-15 ENCOUNTER — Other Ambulatory Visit: Payer: Self-pay | Admitting: Family Medicine

## 2023-12-15 DIAGNOSIS — I10 Essential (primary) hypertension: Secondary | ICD-10-CM

## 2023-12-27 NOTE — Pre-Procedure Instructions (Signed)
 Attempted to call patient cell #, no answer unable to leave voicemail box is full.  Was able to leave voicemail on husband voicemail with the following items: Arrival time 0930- new arrival time Nothing to eat or drink after midnight No meds AM of procedure Responsible person to drive you home and stay with you for 24 hrs  Have you missed any doses of anti-coagulant Eliquis - should be taken twice a day, if you have missed any doses please let us  know.  Don't take dose morning of procedure.

## 2023-12-28 ENCOUNTER — Ambulatory Visit (HOSPITAL_COMMUNITY)
Admission: RE | Admit: 2023-12-28 | Discharge: 2023-12-28 | Disposition: A | Discharge status: Home / Self Care | Attending: Cardiology | Admitting: Cardiology

## 2023-12-28 ENCOUNTER — Ambulatory Visit (HOSPITAL_COMMUNITY)
Admission: RE | Disposition: A | Discharge status: Home / Self Care | Payer: Self-pay | Source: Home / Self Care | Attending: Cardiology

## 2023-12-28 ENCOUNTER — Ambulatory Visit (HOSPITAL_COMMUNITY): Admitting: Anesthesiology

## 2023-12-28 ENCOUNTER — Other Ambulatory Visit: Payer: Self-pay

## 2023-12-28 DIAGNOSIS — I4819 Other persistent atrial fibrillation: Secondary | ICD-10-CM

## 2023-12-28 DIAGNOSIS — Z7901 Long term (current) use of anticoagulants: Secondary | ICD-10-CM | POA: Insufficient documentation

## 2023-12-28 DIAGNOSIS — D6869 Other thrombophilia: Secondary | ICD-10-CM | POA: Insufficient documentation

## 2023-12-28 DIAGNOSIS — I4821 Permanent atrial fibrillation: Secondary | ICD-10-CM | POA: Diagnosis not present

## 2023-12-28 DIAGNOSIS — I10 Essential (primary) hypertension: Secondary | ICD-10-CM | POA: Insufficient documentation

## 2023-12-28 DIAGNOSIS — Z79899 Other long term (current) drug therapy: Secondary | ICD-10-CM | POA: Insufficient documentation

## 2023-12-28 DIAGNOSIS — I483 Typical atrial flutter: Secondary | ICD-10-CM | POA: Insufficient documentation

## 2023-12-28 HISTORY — PX: ATRIAL FIBRILLATION ABLATION: EP1191

## 2023-12-28 HISTORY — PX: A-FLUTTER ABLATION: EP1230

## 2023-12-28 SURGERY — ATRIAL FIBRILLATION ABLATION
Anesthesia: General

## 2023-12-28 MED ORDER — PHENYLEPHRINE 80 MCG/ML (10ML) SYRINGE FOR IV PUSH (FOR BLOOD PRESSURE SUPPORT)
PREFILLED_SYRINGE | INTRAVENOUS | Status: DC | PRN
  Administered 2023-12-28: 120 ug via INTRAVENOUS

## 2023-12-28 MED ORDER — HEPARIN SODIUM (PORCINE) 1000 UNIT/ML IJ SOLN
INTRAMUSCULAR | Status: AC
  Filled 2023-12-28: qty 20

## 2023-12-28 MED ORDER — FENTANYL CITRATE (PF) 250 MCG/5ML IJ SOLN
INTRAMUSCULAR | Status: DC | PRN
  Administered 2023-12-28: 100 ug via INTRAVENOUS

## 2023-12-28 MED ORDER — PHENYLEPHRINE HCL-NACL 20-0.9 MG/250ML-% IV SOLN
INTRAVENOUS | Status: DC | PRN
  Administered 2023-12-28: 30 ug/min via INTRAVENOUS

## 2023-12-28 MED ORDER — ONDANSETRON HCL 4 MG/2ML IJ SOLN
INTRAMUSCULAR | Status: DC | PRN
  Administered 2023-12-28: 4 mg via INTRAVENOUS

## 2023-12-28 MED ORDER — ACETAMINOPHEN 325 MG PO TABS: 650.0000 mg | ORAL_TABLET | ORAL | Status: DC | PRN

## 2023-12-28 MED ORDER — APIXABAN 5 MG PO TABS
5.0000 mg | ORAL_TABLET | Freq: Once | ORAL | Status: AC
Start: 2023-12-28 — End: 2023-12-28
  Administered 2023-12-28: 5 mg via ORAL
  Filled 2023-12-28: qty 1

## 2023-12-28 MED ORDER — HEPARIN SODIUM (PORCINE) 1000 UNIT/ML IJ SOLN
INTRAMUSCULAR | Status: DC | PRN
  Administered 2023-12-28: 12000 [IU] via INTRAVENOUS

## 2023-12-28 MED ORDER — SODIUM CHLORIDE 0.9 % IV SOLN: 250.0000 mL | INTRAVENOUS | Status: DC | PRN

## 2023-12-28 MED ORDER — HEPARIN (PORCINE) IN NACL 1000-0.9 UT/500ML-% IV SOLN
INTRAVENOUS | Status: DC | PRN
  Administered 2023-12-28 (×3): 500 mL

## 2023-12-28 MED ORDER — SODIUM CHLORIDE 0.9% FLUSH: 3.0000 mL | Freq: Two times a day (BID) | INTRAVENOUS | Status: DC

## 2023-12-28 MED ORDER — SUGAMMADEX SODIUM 200 MG/2ML IV SOLN
INTRAVENOUS | Status: DC | PRN
  Administered 2023-12-28: 150 mg via INTRAVENOUS

## 2023-12-28 MED ORDER — FENTANYL CITRATE (PF) 100 MCG/2ML IJ SOLN
INTRAMUSCULAR | Status: AC
  Filled 2023-12-28: qty 2

## 2023-12-28 MED ORDER — SODIUM CHLORIDE 0.9% FLUSH: 3.0000 mL | INTRAVENOUS | Status: DC | PRN

## 2023-12-28 MED ORDER — SODIUM CHLORIDE 0.9 % IV SOLN: INTRAVENOUS | Status: DC

## 2023-12-28 MED ORDER — PROPOFOL 10 MG/ML IV BOLUS
INTRAVENOUS | Status: DC | PRN
  Administered 2023-12-28: 90 mg via INTRAVENOUS

## 2023-12-28 MED ORDER — LIDOCAINE 2% (20 MG/ML) 5 ML SYRINGE
INTRAMUSCULAR | Status: DC | PRN
  Administered 2023-12-28: 100 mg via INTRAVENOUS

## 2023-12-28 MED ORDER — ROCURONIUM BROMIDE 10 MG/ML (PF) SYRINGE
PREFILLED_SYRINGE | INTRAVENOUS | Status: DC | PRN
  Administered 2023-12-28: 60 mg via INTRAVENOUS

## 2023-12-28 MED ORDER — ATROPINE SULFATE 1 MG/10ML IJ SOSY
PREFILLED_SYRINGE | INTRAMUSCULAR | Status: DC | PRN
Start: 2023-12-28 — End: 2023-12-28
  Administered 2023-12-28: 1 mg via INTRAVENOUS

## 2023-12-28 MED ORDER — DEXAMETHASONE SOD PHOSPHATE PF 10 MG/ML IJ SOLN
INTRAMUSCULAR | Status: DC | PRN
  Administered 2023-12-28: 5 mg via INTRAVENOUS

## 2023-12-28 MED ORDER — PROTAMINE SULFATE 10 MG/ML IV SOLN
INTRAVENOUS | Status: DC | PRN
  Administered 2023-12-28: 35 mg via INTRAVENOUS

## 2023-12-28 MED ORDER — ONDANSETRON HCL 4 MG/2ML IJ SOLN: 4.0000 mg | Freq: Four times a day (QID) | INTRAMUSCULAR | Status: DC | PRN

## 2023-12-28 SURGICAL SUPPLY — 18 items
CABLE FARASTAR GEN2 SNGL USE (CABLE) IMPLANT
CATH BI DIR 7FR CS F-J 12 PIN (CATHETERS) IMPLANT
CATH FARAWAVE 2.0 31 (CATHETERS) IMPLANT
CATH GE 8FR SOUNDSTAR (CATHETERS) IMPLANT
CATH OCTARAY 2.0 F 3-3-3-3-3 (CATHETERS) IMPLANT
CLOSURE PERCLOSE PROSTYLE (Vascular Products) IMPLANT
COVER SWIFTLINK CONNECTOR (BAG) ×2 IMPLANT
DEVICE CLOSURE MYNXGRIP 6/7F (Vascular Products) IMPLANT
DILATOR VESSEL 38 20CM 16FR (INTRODUCER) IMPLANT
GUIDEWIRE INQWIRE 1.5J.035X260 (WIRE) IMPLANT
KIT VERSACROSS CNCT FARADRIVE (KITS) IMPLANT
PACK EP LF (CUSTOM PROCEDURE TRAY) ×2 IMPLANT
PAD DEFIB RADIO PHYSIO CONN (PAD) ×2 IMPLANT
PATCH CARTO3 (PAD) IMPLANT
SHEATH FARADRIVE STEERABLE (SHEATH) IMPLANT
SHEATH PINNACLE 8F 10CM (SHEATH) IMPLANT
SHEATH PINNACLE 9F 10CM (SHEATH) IMPLANT
SHEATH PROBE COVER 6X72 (BAG) IMPLANT

## 2023-12-28 NOTE — Anesthesia Preprocedure Evaluation (Addendum)
 Anesthesia Evaluation  Patient identified by MRN, date of birth, ID band Patient awake    Reviewed: Allergy & Precautions, NPO status , Patient's Chart, lab work & pertinent test results, reviewed documented beta blocker date and time   History of Anesthesia Complications Negative for: history of anesthetic complications  Airway Mallampati: II  TM Distance: >3 FB     Dental no notable dental hx.    Pulmonary neg COPD   breath sounds clear to auscultation       Cardiovascular hypertension, (-) CAD, (-) Past MI and (-) Cardiac Stents + dysrhythmias Atrial Fibrillation  Rhythm:Regular Rate:Normal  IMPRESSIONS     1. Left ventricular ejection fraction, by estimation, is 60 to 65%. Left  ventricular ejection fraction by 3D volume is 61 %. The left ventricle has  normal function. The left ventricle has no regional wall motion  abnormalities. Left ventricular diastolic   parameters are indeterminate.   2. Right ventricular systolic function is normal. The right ventricular  size is normal. There is normal pulmonary artery systolic pressure.   3. Left atrial size was mildly dilated.   4. Right atrial size was mildly dilated.   5. The mitral valve is normal in structure. Mild mitral valve  regurgitation.   6. The aortic valve is tricuspid. Aortic valve regurgitation is trivial.  No aortic stenosis is present.   7. The inferior vena cava is normal in size with greater than 50%  respiratory variability, suggesting right atrial pressure of 3 mmHg.      Neuro/Psych neg Seizures    GI/Hepatic ,GERD  ,,(+) neg Cirrhosis        Endo/Other    Renal/GU Renal disease     Musculoskeletal  (+) Arthritis ,    Abdominal   Peds  Hematology   Anesthesia Other Findings   Reproductive/Obstetrics                              Anesthesia Physical Anesthesia Plan  ASA: 2  Anesthesia Plan: General    Post-op Pain Management:    Induction: Intravenous  PONV Risk Score and Plan: 2 and Ondansetron  and Dexamethasone   Airway Management Planned: Oral ETT  Additional Equipment:   Intra-op Plan:   Post-operative Plan: Extubation in OR  Informed Consent: I have reviewed the patients History and Physical, chart, labs and discussed the procedure including the risks, benefits and alternatives for the proposed anesthesia with the patient or authorized representative who has indicated his/her understanding and acceptance.     Dental advisory given  Plan Discussed with: CRNA  Anesthesia Plan Comments:         Anesthesia Quick Evaluation

## 2023-12-28 NOTE — H&P (Signed)
 Electrophysiology Offce Note:   Date:  12/28/23 ID:  Chelsea Morgan, DOB 1950/03/28, MRN [id number]   Primary Cardiologist: None Electrophysiologist: Danelle Birmingham, MD       History of Present Illness:   Chelsea Morgan is a 73 y.o. female with h/o atrial flutter status post catheter ablation, permanent atrial fibrillation, amyloidosis of larynx, hypertension who i was initially sent to me for evaluation for Watchman device implant at the request of Dr. Birmingham. She has a history of atrial fibrillation and underwent an ablation for atrial flutter several years ago. She experiences atrial fibrillation pretty much all of the time.  She has not been taking oral anti-coagulation. She describes herself as clumsy with falls. She previously inquired about the Watchman procedure but she is concerned about having something permanently placed in her heart.  Because of this she requested to pursue AF ablation and if she maintains sinus rhythm then to discontinue oral anticoagulation.  She was sent for cardioversion to see if she was able to maintain sinus for any period of time.   Discussed the use of AI scribe software for clinical note transcription with the patient, who gave verbal consent to proceed.   History of Present Illness Chelsea Morgan is a 73 year old female with atrial fibrillation who presents for follow-up after recent cardioversion on 10/17/2023.Chelsea Morgan   She experienced a period of normal sinus rhythm for two to three days, during which she felt increased energy, but subsequently reverted to atrial fibrillation. She has been in atrial fibrillation for a prolonged period, and previous attempts to maintain normal rhythm have been challenging.   She recalls a previous cardioversion procedure that successfully restored her to normal rhythm with minimal intervention. This experience was positive, as she noted the ease with which her heart rhythm was corrected.   She is currently taking Eliquis , a blood  thinner, and has not noticed any issues.   She is otherwise doing relatively well with no new or acute complaints today.   Interval: Patient presents today for planned ablation. Reports feeling relatively well. No new or acute complaints.   Review of systems complete and found to be negative unless listed in HPI.    EP Information / Studies Reviewed:     EKG Interpretation Date/Time:                  Tuesday October 25 2023 11:05:15 EDT Ventricular Rate:         91 PR Interval:                   QRS Duration:             82 QT Interval:                 370 QTC Calculation:455 R Axis:                         28   Text Interpretation:      Atrial fibrillation When compared with ECG of 17-Oct-2023 07:42, Atrial fibrillation has replaced Sinus rhythm Vent. rate has increased BY  38 BPM Nonspecific T wave abnormality now evident in Lateral leads Confirmed by Kennyth Chew [phone]) on 10/25/2023 11:59:41 AM    Echo 11/05/2012:  - Left ventricle: The cavity size was normal. Wall thickness was    normal. Systolic function was normal. The estimated ejection    fraction was in the range of 60% to 65%.  Wall motion was normal;    there were no regional wall motion abnormalities.    Risk Assessment/Calculations:     CHA2DS2-VASc Score = 4  The patient's score is based upon: CHF History: 0 HTN History: 1 Diabetes History: 0 Stroke History: 0 Vascular Disease History: 0 Age Score: 2 Gender Score: 1         Physical Exam:    Today's Vitals   12/28/23 0959 12/28/23 1007  BP:  (!) 146/82  Pulse:  87  Resp:  20  Temp:  97.8 F (36.6 C)  TempSrc:  Oral  SpO2:  99%  Weight:  59 kg  Height:  5' 2 (1.575 m)  PainSc: 0-No pain    Body mass index is 23.78 kg/m.   GEN: Well nourished, well developed in no acute distress NECK: No JVD CARDIAC: Normal rate, irregular rhythm. RESPIRATORY:  Clear to auscultation without rales, wheezing or rhonchi  ABDOMEN: Soft,  non-distended EXTREMITIES:  No edema; No deformity    ASSESSMENT AND PLAN:     Longstanding Persistent Atrial Fibrillation: Symptomatic despite adequate rate control.  She reports symptomatic improvement after cardioversion.  Was able to maintain sinus for 2 to 3 days. Typical atrial flutter status post ablation: -Discussed treatment options today for AF including antiarrhythmic drug therapy and ablation. Discussed risks, recovery and likelihood of success with each treatment strategy. Risk, benefits, and alternatives to EP study and ablation for afib were discussed. These risks include but are not limited to stroke, bleeding, vascular damage, tamponade, perforation, damage to the esophagus, lungs, phrenic nerve and other structures, pulmonary vein stenosis, worsening renal function, coronary vasospasm and death.  Discussed potential need for repeat ablation procedures and antiarrhythmic drugs after an initial ablation. The patient understands these risk and wishes to proceed. - Continue carvedilol  12.5 mg twice daily   Secondary hypercoagulable state due to atrial fibrillation/flutter: The patient's CHA2DS2-VASc score is 4, indicating a 4.8% annual risk of stroke.   - Patient is currently at taking Eliquis  and tolerating this without side effects or bleeding issues.       Signed, Fonda Kitty, MD

## 2023-12-28 NOTE — Discharge Instructions (Signed)

## 2023-12-28 NOTE — Progress Notes (Signed)
 Patient dressed for discharge. Bilateral groin sites dressings intact. No bleeding or hematoma noted. Dr. Kennyth has seen the patient. Discharge instructions given to patient and her husband. No concerns voiced.

## 2023-12-28 NOTE — Anesthesia Procedure Notes (Signed)
 Procedure Name: Intubation Date/Time: 12/28/2023 2:06 PM  Performed by: Delores Duwaine SAUNDERS, CRNAPre-anesthesia Checklist: Patient identified, Emergency Drugs available, Suction available and Patient being monitored Patient Re-evaluated:Patient Re-evaluated prior to induction Oxygen Delivery Method: Circle System Utilized Preoxygenation: Pre-oxygenation with 100% oxygen Induction Type: IV induction Ventilation: Mask ventilation without difficulty Laryngoscope Size: Mac and 3 Grade View: Grade I Tube type: Oral Tube size: 7.0 mm Number of attempts: 1 Airway Equipment and Method: Stylet and Oral airway Placement Confirmation: ETT inserted through vocal cords under direct vision, positive ETCO2 and breath sounds checked- equal and bilateral Secured at: 22 cm Tube secured with: Tape Dental Injury: Teeth and Oropharynx as per pre-operative assessment

## 2023-12-28 NOTE — Progress Notes (Signed)
 Patient ambulated to the bathroom and was able to void. No bleeding or hematoma noted to bilateral groin sites. Dressing to groin sites clean, dry, and intact.

## 2023-12-28 NOTE — Transfer of Care (Signed)
 Immediate Anesthesia Transfer of Care Note  Patient: Chelsea Morgan  Procedure(s) Performed: ATRIAL FIBRILLATION ABLATION A-FLUTTER ABLATION  Patient Location: PACU and Cath Lab  Anesthesia Type:General  Level of Consciousness: awake, alert , and oriented  Airway & Oxygen Therapy: Patient Spontanous Breathing and Patient connected to nasal cannula oxygen  Post-op Assessment: Report given to RN and Post -op Vital signs reviewed and stable  Post vital signs: Reviewed and stable  Last Vitals:  Vitals Value Taken Time  BP    Temp    Pulse 81 12/28/23 15:43  Resp 19 12/28/23 15:43  SpO2 100 % 12/28/23 15:43  Vitals shown include unfiled device data.  Last Pain:  Vitals:   12/28/23 1007  TempSrc: Oral  PainSc:          Complications: No notable events documented.

## 2023-12-29 ENCOUNTER — Encounter (HOSPITAL_COMMUNITY): Payer: Self-pay | Admitting: Cardiology

## 2023-12-29 ENCOUNTER — Telehealth: Payer: Self-pay | Admitting: Cardiology

## 2023-12-29 ENCOUNTER — Telehealth (HOSPITAL_COMMUNITY): Payer: Self-pay

## 2023-12-29 LAB — POCT ACTIVATED CLOTTING TIME: Activated Clotting Time: 343 s

## 2023-12-29 MED FILL — Fentanyl Citrate Preservative Free (PF) Inj 100 MCG/2ML: INTRAMUSCULAR | Qty: 2 | Status: AC

## 2023-12-29 NOTE — Telephone Encounter (Signed)
 See post procedure call from today.

## 2023-12-29 NOTE — Telephone Encounter (Signed)
 Pt would like a medication to help manage her itching.

## 2023-12-29 NOTE — Telephone Encounter (Signed)
Unable to reach pt or leave a message mailbox is full 

## 2023-12-29 NOTE — Telephone Encounter (Addendum)
 Spoke with patient to complete post procedure follow up call.  Patient reports no complications with groin sites. She did experience itching at the chest and back areas from the defibrillator pads on last night. She has taken OTC Bendadryl and reports some improvement this morning. Advised patient she can apply cool cloths and aloe gel/product to both areas for relief. She voiced understanding and appreciation.   Instructions reviewed with patient:  Remove large bandage at puncture site after 24 hours. It is normal to have bruising, tenderness, mild swelling, and a pea or marble sized lump/knot at the groin site which can take up to three months to resolve.  Get help right away if you notice sudden swelling at the puncture site.  Check your puncture site every day for signs of infection: fever, redness, swelling, pus drainage, warmth, foul odor or excessive pain. If this occurs, please call [phone], to speak with the RN Navigator. Get help right away if your puncture site is bleeding and the bleeding does not stop after applying firm pressure to the area.  You may continue to have skipped beats/ atrial fibrillation during the first several months after your procedure.  It is very important not to miss any doses of your blood thinner Eliquis .    You will follow up with the Afib clinic 4 weeks after your procedure and follow up with the APP 3 months after your procedure.  Activity restrictions reviewed.  Patient verbalized understanding to all instructions provided.

## 2023-12-29 NOTE — Anesthesia Postprocedure Evaluation (Signed)
 Anesthesia Post Note  Patient: Chelsea Morgan  Procedure(s) Performed: ATRIAL FIBRILLATION ABLATION A-FLUTTER ABLATION     Patient location during evaluation: PACU Anesthesia Type: General Level of consciousness: awake and alert Pain management: pain level controlled Vital Signs Assessment: post-procedure vital signs reviewed and stable Respiratory status: spontaneous breathing, nonlabored ventilation, respiratory function stable and patient connected to nasal cannula oxygen Cardiovascular status: blood pressure returned to baseline and stable Postop Assessment: no apparent nausea or vomiting Anesthetic complications: no   No notable events documented.  Last Vitals:  Vitals:   12/28/23 1700 12/28/23 1900  BP: (!) 145/68 122/68  Pulse: 66 80  Resp: 15   Temp:    SpO2: 99% 97%    Last Pain:  Vitals:   12/28/23 1626  TempSrc:   PainSc: 0-No pain                 Lynwood MARLA Cornea

## 2023-12-29 NOTE — Telephone Encounter (Signed)
 Attempted to reach patient to follow up with procedure completed on 12/28/23, no answer. Unable to leave message- VM full.

## 2024-01-01 ENCOUNTER — Other Ambulatory Visit: Payer: Self-pay | Admitting: Family Medicine

## 2024-01-02 ENCOUNTER — Encounter: Payer: Self-pay | Admitting: Emergency Medicine

## 2024-01-03 DIAGNOSIS — I83891 Varicose veins of right lower extremities with other complications: Secondary | ICD-10-CM | POA: Diagnosis not present

## 2024-01-03 DIAGNOSIS — I87393 Chronic venous hypertension (idiopathic) with other complications of bilateral lower extremity: Secondary | ICD-10-CM | POA: Diagnosis not present

## 2024-01-03 DIAGNOSIS — M79661 Pain in right lower leg: Secondary | ICD-10-CM | POA: Diagnosis not present

## 2024-01-16 DIAGNOSIS — M545 Low back pain, unspecified: Secondary | ICD-10-CM | POA: Diagnosis not present

## 2024-01-16 DIAGNOSIS — M25561 Pain in right knee: Secondary | ICD-10-CM | POA: Diagnosis not present

## 2024-01-21 ENCOUNTER — Other Ambulatory Visit: Payer: Self-pay | Admitting: Family Medicine

## 2024-01-21 DIAGNOSIS — L659 Nonscarring hair loss, unspecified: Secondary | ICD-10-CM

## 2024-01-21 DIAGNOSIS — I4892 Unspecified atrial flutter: Secondary | ICD-10-CM

## 2024-01-24 ENCOUNTER — Ambulatory Visit (HOSPITAL_COMMUNITY)
Admission: RE | Admit: 2024-01-24 | Discharge: 2024-01-24 | Disposition: A | Discharge status: Home / Self Care | Source: Ambulatory Visit | Attending: Physician Assistant | Admitting: Physician Assistant

## 2024-01-24 ENCOUNTER — Other Ambulatory Visit: Payer: Self-pay | Admitting: Family Medicine

## 2024-01-24 VITALS — BP 140/56 | HR 59 | Ht 62.0 in | Wt 134.8 lb

## 2024-01-24 DIAGNOSIS — D6869 Other thrombophilia: Secondary | ICD-10-CM

## 2024-01-24 DIAGNOSIS — I4892 Unspecified atrial flutter: Secondary | ICD-10-CM

## 2024-01-24 DIAGNOSIS — I10 Essential (primary) hypertension: Secondary | ICD-10-CM

## 2024-01-24 DIAGNOSIS — I4891 Unspecified atrial fibrillation: Secondary | ICD-10-CM

## 2024-01-24 DIAGNOSIS — L659 Nonscarring hair loss, unspecified: Secondary | ICD-10-CM

## 2024-01-24 DIAGNOSIS — I4811 Longstanding persistent atrial fibrillation: Secondary | ICD-10-CM

## 2024-01-24 NOTE — Progress Notes (Signed)
 Primary Care Physician: Mahlon Comer BRAVO, MD Primary Cardiologist: None Electrophysiologist: Fonda Kitty, MD  Referring Physician: Dr [name] Chelsea Morgan is a 73 y.o. female with a history of amyloidosis of larynx, HTN, atrial fibrillation who presents for follow up in the [facility].  The patient was initially diagnosed with atrial flutter and underwent an atrial flutter ablation with Dr Waddell in 2015. She was diagnosed with atrial fibrillation and had longstanding afib since 2021. She was seen by Dr Kitty and underwent DCCV on 10/17/23. She was in SR for a few days and did have symptomatic improvement. She later underwent afib ablation on 12/28/23. Patient is on Eliquis  for stroke prevention.   Patient presents today for follow up for atrial fibrillation. She remains in SR today and feels well. She has not had any interim symptoms of afib. She feels her stamina is slowly improving. She denies chest pain or groin issues.   Today, she denies symptoms of palpitations, chest pain, shortness of breath, orthopnea, PND, lower extremity edema, dizziness, presyncope, syncope, snoring, daytime somnolence, bleeding, or neurologic sequela. The patient is tolerating medications without difficulties and is otherwise without complaint today.    Atrial Fibrillation Risk Factors:  she does not have symptoms or diagnosis of sleep apnea. she does not have a history of rheumatic fever.   Atrial Fibrillation Management history:  Previous antiarrhythmic drugs: none Previous cardioversions: 10/17/23 Previous ablations: 12/28/13 flutter, 12/28/23 Anticoagulation history: Eliquis   ROS- All systems are reviewed and negative except as per the HPI above.  Past Medical History:  Diagnosis Date   ALLERGIC RHINITIS 01/12/2008   Amyloidosis of larynx (HCC)    s/p Coblator excision of false vocal fold lesion 07/12/19, pathology AL amyloid, Kappa most compatible  tracheobronchial amyloidosis   Arthritis    back   Atrial flutter (HCC)    Blood transfusion without reported diagnosis 03/09/1979   after ectopic pregnancy   CAROTID BRUITS, BILATERAL 04/01/2009   Dysrhythmia    Essential hypertension, benign 01/12/2008   HYPERLIPIDEMIA 05/02/2007   Rheumatic fever     Current Outpatient Medications  Medication Sig Dispense Refill   apixaban  (ELIQUIS ) 5 MG TABS tablet Take 1 tablet (5 mg total) by mouth 2 (two) times daily. 180 tablet 3   atorvastatin  (LIPITOR) 10 MG tablet Take 1 tablet by mouth once daily 90 tablet 0   B Complex-C (B-COMPLEX WITH VITAMIN C) tablet Take 1 tablet by mouth daily.     BIOTIN 5000 PO Take 5,000 mcg by mouth daily.     carvedilol  (COREG ) 12.5 MG tablet Take 1/2 (one-half) tablet by mouth twice daily 90 tablet 0   Coenzyme Q10 (COQ10 PO) Take 300 mg by mouth every evening.     EPINEPHrine  (EPI-PEN) 0.3 mg/0.3 mL DEVI Inject 0.3 mg into the muscle as needed (alleric reaction).     finasteride  (PROSCAR ) 5 MG tablet Take 1/2 (one-half) tablet by mouth once daily 45 tablet 0   hydrochlorothiazide  (HYDRODIURIL ) 25 MG tablet Take 1 tablet by mouth once daily 90 tablet 0   MAGNESIUM  PO Take 240 mg by mouth at bedtime.     melatonin 5 MG TABS Take 5 mg by mouth at bedtime. (Patient taking differently: Take 5 mg by mouth as needed.)     POTASSIUM PO Take 49.5 mg by mouth daily. 99 mg     Omega-3 Fatty Acids (FISH OIL PO) Take 1,400 mg by mouth at bedtime.     No  current facility-administered medications for this encounter.    Physical Exam: BP (!) 140/56   Pulse (!) 59   Ht 5' 2 (1.575 m)   Wt 61.1 kg   BMI 24.66 kg/m   GEN: Well nourished, well developed in no acute distress CARDIAC: Regular rate and rhythm, no murmurs, rubs, gallops RESPIRATORY:  Clear to auscultation without rales, wheezing or rhonchi  ABDOMEN: Soft, non-tender, non-distended EXTREMITIES:  No edema; No deformity   Wt Readings from Last 3  Encounters:  01/24/24 61.1 kg  12/28/23 59 kg  10/25/23 60.1 kg     EKG Interpretation Date/Time:  Tuesday January 24 2024 13:32:01 EST Ventricular Rate:  59 PR Interval:  184 QRS Duration:  76 QT Interval:  400 QTC Calculation: 396 R Axis:   36  Text Interpretation: Sinus bradycardia with Premature atrial complexes Otherwise normal ECG When compared with ECG of 28-Dec-2023 15:55, No significant change was found Confirmed by Xai Frerking ([id number]) on 01/24/2024 1:59:15 PM    Echo 10/24/23 demonstrated   1. Left ventricular ejection fraction, by estimation, is 60 to 65%. Left  ventricular ejection fraction by 3D volume is 61 %. The left ventricle has  normal function. The left ventricle has no regional wall motion  abnormalities. Left ventricular diastolic parameters are indeterminate.   2. Right ventricular systolic function is normal. The right ventricular  size is normal. There is normal pulmonary artery systolic pressure.   3. Left atrial size was mildly dilated.   4. Right atrial size was mildly dilated.   5. The mitral valve is normal in structure. Mild mitral valve  regurgitation.   6. The aortic valve is tricuspid. Aortic valve regurgitation is trivial.  No aortic stenosis is present.   7. The inferior vena cava is normal in size with greater than 50%  respiratory variability, suggesting right atrial pressure of 3 mmHg.    CHA2DS2-VASc Score = 4  The patient's score is based upon: CHF History: 0 HTN History: 1 Diabetes History: 0 Stroke History: 0 Vascular Disease History: 0 Age Score: 2 Gender Score: 1       ASSESSMENT AND PLAN: Longstanding Persistent Atrial Fibrillation/atrial flutter (ICD10:  I48.11) The patient's CHA2DS2-VASc score is 4, indicating a 4.8% annual risk of stroke.   S/p flutter ablation 2015. Afib ablation 12/28/23 Patient appears to be maintaining SR Continue Eliquis  5 mg BID with no missed doses for 3 months post ablation.  Continue  carvedilol  6.25 mg BID  Secondary Hypercoagulable State (ICD10:  D68.69) The patient is at significant risk for stroke/thromboembolism based upon her CHA2DS2-VASc Score of 4.  Continue Apixaban  (Eliquis ).   HTN Stable on current regimen   Follow up with Jodie Passey as scheduled.     [facility] [address] [place], [place] [address] [phone]

## 2024-02-07 DIAGNOSIS — I87391 Chronic venous hypertension (idiopathic) with other complications of right lower extremity: Secondary | ICD-10-CM | POA: Diagnosis not present

## 2024-02-28 DIAGNOSIS — E8581 Light chain (AL) amyloidosis: Secondary | ICD-10-CM | POA: Diagnosis not present

## 2024-03-28 ENCOUNTER — Ambulatory Visit: Attending: Cardiology | Admitting: Student

## 2024-03-28 ENCOUNTER — Encounter: Payer: Self-pay | Admitting: Student

## 2024-03-28 VITALS — BP 142/72 | HR 59 | Ht 62.0 in | Wt 136.0 lb

## 2024-03-28 DIAGNOSIS — I4891 Unspecified atrial fibrillation: Secondary | ICD-10-CM | POA: Diagnosis not present

## 2024-03-28 NOTE — Progress Notes (Signed)
" °  Electrophysiology Office Note:   Date:  03/28/2024  ID:  KEELIE ZEMANEK, DOB [birth year], MRN [id number]  Primary Cardiologist: None Electrophysiologist: Fonda Kitty, MD   Electrophysiologist:  Fonda Kitty, MD      History of Present Illness:   Chelsea Morgan is a 74 y.o. female with h/o amyloidosis of larynx, HTN, atrial fibrillation and atrial flutter seen today for routine electrophysiology follow-up s/p Ablation.  Since last being seen in our clinic the patient reports doing very well. No breakthrough arrhythmia of which she is aware. Overall, she denies chest pain, palpitations, dyspnea, PND, orthopnea, nausea, vomiting, dizziness, syncope, edema, weight gain, or early satiety.    Review of systems complete and found to be negative unless listed in HPI.   EP Information / Studies Reviewed:    EKG is ordered today. Personal review as below.  EKG Interpretation Date/Time:  Wednesday March 28 2024 11:52:02 EST Ventricular Rate:  59 PR Interval:  176 QRS Duration:  84 QT Interval:  404 QTC Calculation: 399 R Axis:   25  Text Interpretation: Sinus bradycardia with sinus arrhythmia Confirmed by Lesia Heck ([id number]) on 03/28/2024 12:05:18 PM    Arrhythmia/Device History No specialty comments available.   Physical Exam:   VS:  BP (!) 142/72   Pulse (!) 59   Ht 5' 2 (1.575 m)   Wt 136 lb (61.7 kg)   SpO2 99%   BMI 24.87 kg/m    Wt Readings from Last 3 Encounters:  03/28/24 136 lb (61.7 kg)  01/24/24 134 lb 12.8 oz (61.1 kg)  12/28/23 130 lb (59 kg)     GEN: No acute distress NECK: No JVD; No carotid bruits CARDIAC: Regular rate and rhythm, no murmurs, rubs, gallops RESPIRATORY:  Clear to auscultation without rales, wheezing or rhonchi  ABDOMEN: Soft, non-tender, non-distended EXTREMITIES:  No edema; No deformity   ASSESSMENT AND PLAN:    Persistent atrial fibrillation Atrial flutter Secondary hypercoagulable state EKG today with normal rhythm. Maintaining  NSR by symptoms She is on eliquis  5 mg BID for CHA2DS2VASc of at least 4. She would like to stop. Long discussion about stroke risk, and ways to monitor or further ameliorate stroke risk including apple watch alerts with pill in pocket OAC, loop recorder, or watchman consideration.  She wishes to remain off OAC and verbalizes understanding of stroke risk.  Will discuss with Dr. Kitty.   HTN Stable on current regimen    Follow up with Dr. Kitty in 6 months; Sooner pending discussion re: OAC.   Signed, Ozell Prentice Lesia, PA-C  "

## 2024-03-28 NOTE — Patient Instructions (Signed)
 Medication Instructions:  Your physician recommends that you continue on your current medications as directed. Please refer to the Current Medication list given to you today.  *If you need a refill on your cardiac medications before your next appointment, please call your pharmacy*  Follow-Up: At [facility], you and your health needs are our priority.  As part of our continuing mission to provide you with exceptional heart care, our providers are all part of one team.  This team includes your primary Cardiologist (physician) and Advanced Practice Providers or APPs (Physician Assistants and Nurse Practitioners) who all work together to provide you with the care you need, when you need it.  Your next appointment:   6 months  Provider:   Ardeen Kohler, MD

## 2024-04-24 ENCOUNTER — Encounter: Admitting: Family Medicine

## 2024-09-18 ENCOUNTER — Encounter
